# Patient Record
Sex: Female | Born: 1935
Health system: Southern US, Community
[De-identification: ages and names within clinical notes are randomized; demographics above are authoritative.]

## PROBLEM LIST (undated history)

## (undated) DIAGNOSIS — I10 Essential (primary) hypertension: Secondary | ICD-10-CM

## (undated) DIAGNOSIS — Z8719 Personal history of other diseases of the digestive system: Secondary | ICD-10-CM

## (undated) DIAGNOSIS — M810 Age-related osteoporosis without current pathological fracture: Secondary | ICD-10-CM

## (undated) DIAGNOSIS — E538 Deficiency of other specified B group vitamins: Secondary | ICD-10-CM

## (undated) DIAGNOSIS — D649 Anemia, unspecified: Secondary | ICD-10-CM

## (undated) DIAGNOSIS — R319 Hematuria, unspecified: Secondary | ICD-10-CM

## (undated) DIAGNOSIS — T7840XA Allergy, unspecified, initial encounter: Secondary | ICD-10-CM

## (undated) DIAGNOSIS — I495 Sick sinus syndrome: Secondary | ICD-10-CM

## (undated) DIAGNOSIS — K5792 Diverticulitis of intestine, part unspecified, without perforation or abscess without bleeding: Secondary | ICD-10-CM

## (undated) DIAGNOSIS — H269 Unspecified cataract: Secondary | ICD-10-CM

## (undated) DIAGNOSIS — J449 Chronic obstructive pulmonary disease, unspecified: Secondary | ICD-10-CM

## (undated) DIAGNOSIS — N189 Chronic kidney disease, unspecified: Secondary | ICD-10-CM

## (undated) DIAGNOSIS — R011 Cardiac murmur, unspecified: Secondary | ICD-10-CM

## (undated) DIAGNOSIS — D696 Thrombocytopenia, unspecified: Secondary | ICD-10-CM

## (undated) DIAGNOSIS — M25519 Pain in unspecified shoulder: Secondary | ICD-10-CM

## (undated) DIAGNOSIS — M1990 Unspecified osteoarthritis, unspecified site: Secondary | ICD-10-CM

## (undated) DIAGNOSIS — Z950 Presence of cardiac pacemaker: Secondary | ICD-10-CM

## (undated) DIAGNOSIS — E785 Hyperlipidemia, unspecified: Secondary | ICD-10-CM

## (undated) DIAGNOSIS — Z86718 Personal history of other venous thrombosis and embolism: Secondary | ICD-10-CM

## (undated) HISTORY — DX: Hyperlipidemia, unspecified: E78.5

## (undated) HISTORY — DX: Presence of cardiac pacemaker: Z95.0

## (undated) HISTORY — DX: Personal history of other venous thrombosis and embolism: Z86.718

## (undated) HISTORY — PX: PARATHYROIDECTOMY: SHX19

## (undated) HISTORY — DX: Sick sinus syndrome: I49.5

## (undated) HISTORY — PX: FOOT SURGERY: SHX648

## (undated) HISTORY — PX: INSERT / REPLACE / REMOVE PACEMAKER: SUR710

## (undated) HISTORY — DX: Hematuria, unspecified: R31.9

## (undated) HISTORY — PX: THYROID SURGERY: SHX805

## (undated) HISTORY — DX: Unspecified cataract: H26.9

## (undated) HISTORY — DX: Essential (primary) hypertension: I10

## (undated) HISTORY — DX: Allergy, unspecified, initial encounter: T78.40XA

## (undated) HISTORY — DX: Pain in unspecified shoulder: M25.519

## (undated) HISTORY — DX: Cardiac murmur, unspecified: R01.1

## (undated) HISTORY — PX: ORIF ANKLE FRACTURE BIMALLEOLAR: SUR920

## (undated) HISTORY — DX: Diverticulitis of intestine, part unspecified, without perforation or abscess without bleeding: K57.92

## (undated) HISTORY — DX: Chronic obstructive pulmonary disease, unspecified: J44.9

## (undated) HISTORY — PX: OTHER SURGICAL HISTORY: SHX169

## (undated) HISTORY — PX: ABDOMINAL HYSTERECTOMY: SHX81

---

## 1998-03-20 ENCOUNTER — Ambulatory Visit (HOSPITAL_COMMUNITY): Admission: RE | Admit: 1998-03-20 | Discharge: 1998-03-20 | Payer: Self-pay | Admitting: Nephrology

## 1998-03-20 ENCOUNTER — Encounter: Payer: Self-pay | Admitting: Nephrology

## 1998-06-13 ENCOUNTER — Ambulatory Visit (HOSPITAL_COMMUNITY): Admission: RE | Admit: 1998-06-13 | Discharge: 1998-06-13 | Payer: Self-pay

## 1998-07-14 ENCOUNTER — Ambulatory Visit (HOSPITAL_COMMUNITY): Admission: RE | Admit: 1998-07-14 | Discharge: 1998-07-15 | Payer: Self-pay

## 1998-08-06 ENCOUNTER — Emergency Department (HOSPITAL_COMMUNITY): Admission: EM | Admit: 1998-08-06 | Discharge: 1998-08-06 | Payer: Self-pay | Admitting: Emergency Medicine

## 1999-04-07 ENCOUNTER — Encounter: Admission: RE | Admit: 1999-04-07 | Discharge: 1999-04-07 | Payer: Self-pay | Admitting: Urology

## 1999-04-07 ENCOUNTER — Encounter: Payer: Self-pay | Admitting: Urology

## 1999-05-30 ENCOUNTER — Ambulatory Visit (HOSPITAL_COMMUNITY): Admission: EM | Admit: 1999-05-30 | Discharge: 1999-05-30 | Payer: Self-pay | Admitting: Emergency Medicine

## 1999-06-09 ENCOUNTER — Ambulatory Visit (HOSPITAL_COMMUNITY): Admission: RE | Admit: 1999-06-09 | Discharge: 1999-06-09 | Payer: Self-pay | Admitting: Urology

## 1999-06-09 ENCOUNTER — Encounter: Payer: Self-pay | Admitting: Urology

## 1999-07-20 ENCOUNTER — Ambulatory Visit (HOSPITAL_COMMUNITY): Admission: RE | Admit: 1999-07-20 | Discharge: 1999-07-20 | Payer: Self-pay | Admitting: Gastroenterology

## 1999-07-20 ENCOUNTER — Encounter: Payer: Self-pay | Admitting: Gastroenterology

## 2000-04-29 ENCOUNTER — Encounter: Payer: Self-pay | Admitting: Urology

## 2000-04-29 ENCOUNTER — Encounter: Admission: RE | Admit: 2000-04-29 | Discharge: 2000-04-29 | Payer: Self-pay | Admitting: Urology

## 2000-06-07 ENCOUNTER — Ambulatory Visit (HOSPITAL_COMMUNITY): Admission: RE | Admit: 2000-06-07 | Discharge: 2000-06-07 | Payer: Self-pay | Admitting: General Surgery

## 2000-07-28 ENCOUNTER — Encounter: Payer: Self-pay | Admitting: Internal Medicine

## 2000-07-28 ENCOUNTER — Ambulatory Visit (HOSPITAL_COMMUNITY): Admission: RE | Admit: 2000-07-28 | Discharge: 2000-07-28 | Payer: Self-pay | Admitting: Internal Medicine

## 2000-11-15 ENCOUNTER — Emergency Department (HOSPITAL_COMMUNITY): Admission: EM | Admit: 2000-11-15 | Discharge: 2000-11-15 | Payer: Self-pay | Admitting: *Deleted

## 2001-01-22 ENCOUNTER — Inpatient Hospital Stay (HOSPITAL_COMMUNITY): Admission: EM | Admit: 2001-01-22 | Discharge: 2001-01-25 | Payer: Self-pay | Admitting: *Deleted

## 2001-01-24 ENCOUNTER — Encounter: Payer: Self-pay | Admitting: Cardiology

## 2001-09-15 ENCOUNTER — Ambulatory Visit (HOSPITAL_BASED_OUTPATIENT_CLINIC_OR_DEPARTMENT_OTHER): Admission: RE | Admit: 2001-09-15 | Discharge: 2001-09-16 | Payer: Self-pay | Admitting: Orthopedic Surgery

## 2002-02-27 ENCOUNTER — Encounter: Payer: Self-pay | Admitting: Family Medicine

## 2002-02-27 ENCOUNTER — Ambulatory Visit (HOSPITAL_COMMUNITY): Admission: RE | Admit: 2002-02-27 | Discharge: 2002-02-27 | Payer: Self-pay | Admitting: Family Medicine

## 2002-04-27 ENCOUNTER — Ambulatory Visit (HOSPITAL_COMMUNITY): Admission: RE | Admit: 2002-04-27 | Discharge: 2002-04-27 | Payer: Self-pay | Admitting: General Surgery

## 2002-06-14 ENCOUNTER — Encounter: Payer: Self-pay | Admitting: Family Medicine

## 2002-06-14 ENCOUNTER — Ambulatory Visit (HOSPITAL_COMMUNITY): Admission: RE | Admit: 2002-06-14 | Discharge: 2002-06-14 | Payer: Self-pay | Admitting: Family Medicine

## 2002-06-25 ENCOUNTER — Encounter: Payer: Self-pay | Admitting: Internal Medicine

## 2002-06-25 ENCOUNTER — Ambulatory Visit (HOSPITAL_COMMUNITY): Admission: RE | Admit: 2002-06-25 | Discharge: 2002-06-25 | Payer: Self-pay | Admitting: Internal Medicine

## 2002-08-02 ENCOUNTER — Emergency Department (HOSPITAL_COMMUNITY): Admission: EM | Admit: 2002-08-02 | Discharge: 2002-08-02 | Payer: Self-pay | Admitting: Emergency Medicine

## 2002-08-02 ENCOUNTER — Encounter: Payer: Self-pay | Admitting: Emergency Medicine

## 2002-08-06 ENCOUNTER — Encounter: Admission: RE | Admit: 2002-08-06 | Discharge: 2002-08-06 | Payer: Self-pay | Admitting: Urology

## 2002-08-06 ENCOUNTER — Encounter: Payer: Self-pay | Admitting: Urology

## 2003-05-17 ENCOUNTER — Ambulatory Visit (HOSPITAL_COMMUNITY): Admission: RE | Admit: 2003-05-17 | Discharge: 2003-05-17 | Payer: Self-pay | Admitting: Family Medicine

## 2003-05-30 ENCOUNTER — Ambulatory Visit (HOSPITAL_COMMUNITY): Admission: RE | Admit: 2003-05-30 | Discharge: 2003-05-30 | Payer: Self-pay | Admitting: Urology

## 2003-06-20 ENCOUNTER — Ambulatory Visit (HOSPITAL_COMMUNITY): Admission: RE | Admit: 2003-06-20 | Discharge: 2003-06-20 | Payer: Self-pay | Admitting: Family Medicine

## 2003-07-20 ENCOUNTER — Emergency Department (HOSPITAL_COMMUNITY): Admission: EM | Admit: 2003-07-20 | Discharge: 2003-07-20 | Payer: Self-pay | Admitting: Emergency Medicine

## 2003-07-28 ENCOUNTER — Emergency Department (HOSPITAL_COMMUNITY): Admission: EM | Admit: 2003-07-28 | Discharge: 2003-07-28 | Payer: Self-pay | Admitting: Emergency Medicine

## 2004-01-22 ENCOUNTER — Ambulatory Visit: Payer: Self-pay | Admitting: *Deleted

## 2004-01-22 ENCOUNTER — Encounter (INDEPENDENT_AMBULATORY_CARE_PROVIDER_SITE_OTHER): Payer: Self-pay | Admitting: *Deleted

## 2004-01-27 ENCOUNTER — Encounter (HOSPITAL_COMMUNITY): Admission: RE | Admit: 2004-01-27 | Discharge: 2004-01-27 | Payer: Self-pay | Admitting: *Deleted

## 2004-01-27 ENCOUNTER — Ambulatory Visit: Payer: Self-pay | Admitting: *Deleted

## 2004-03-05 ENCOUNTER — Ambulatory Visit: Payer: Self-pay | Admitting: Internal Medicine

## 2004-04-07 ENCOUNTER — Other Ambulatory Visit: Admission: RE | Admit: 2004-04-07 | Discharge: 2004-04-07 | Payer: Self-pay | Admitting: Dermatology

## 2004-08-15 ENCOUNTER — Emergency Department (HOSPITAL_COMMUNITY): Admission: EM | Admit: 2004-08-15 | Discharge: 2004-08-15 | Payer: Self-pay | Admitting: Emergency Medicine

## 2004-08-27 ENCOUNTER — Ambulatory Visit: Payer: Self-pay | Admitting: *Deleted

## 2004-08-27 ENCOUNTER — Ambulatory Visit: Payer: Self-pay | Admitting: Internal Medicine

## 2004-09-03 ENCOUNTER — Emergency Department (HOSPITAL_COMMUNITY): Admission: EM | Admit: 2004-09-03 | Discharge: 2004-09-03 | Payer: Self-pay | Admitting: Emergency Medicine

## 2004-10-05 ENCOUNTER — Ambulatory Visit: Payer: Self-pay | Admitting: Internal Medicine

## 2004-10-12 ENCOUNTER — Other Ambulatory Visit: Admission: RE | Admit: 2004-10-12 | Discharge: 2004-10-12 | Payer: Self-pay | Admitting: Dermatology

## 2004-11-20 ENCOUNTER — Emergency Department (HOSPITAL_COMMUNITY): Admission: EM | Admit: 2004-11-20 | Discharge: 2004-11-20 | Payer: Self-pay | Admitting: Emergency Medicine

## 2004-12-14 ENCOUNTER — Ambulatory Visit: Payer: Self-pay | Admitting: Cardiology

## 2005-01-11 HISTORY — PX: OTHER SURGICAL HISTORY: SHX169

## 2005-02-05 ENCOUNTER — Encounter (INDEPENDENT_AMBULATORY_CARE_PROVIDER_SITE_OTHER): Payer: Self-pay | Admitting: Specialist

## 2005-02-05 ENCOUNTER — Inpatient Hospital Stay (HOSPITAL_COMMUNITY): Admission: RE | Admit: 2005-02-05 | Discharge: 2005-02-10 | Payer: Self-pay | Admitting: Surgery

## 2005-02-22 ENCOUNTER — Inpatient Hospital Stay (HOSPITAL_COMMUNITY): Admission: RE | Admit: 2005-02-22 | Discharge: 2005-02-26 | Payer: Self-pay | Admitting: Family Medicine

## 2005-03-23 ENCOUNTER — Ambulatory Visit (HOSPITAL_COMMUNITY): Admission: RE | Admit: 2005-03-23 | Discharge: 2005-03-23 | Payer: Self-pay | Admitting: Family Medicine

## 2005-03-25 ENCOUNTER — Ambulatory Visit: Payer: Self-pay | Admitting: Cardiology

## 2005-08-16 ENCOUNTER — Ambulatory Visit (HOSPITAL_COMMUNITY): Admission: RE | Admit: 2005-08-16 | Discharge: 2005-08-16 | Payer: Self-pay | Admitting: Internal Medicine

## 2005-09-17 ENCOUNTER — Ambulatory Visit (HOSPITAL_COMMUNITY): Admission: RE | Admit: 2005-09-17 | Discharge: 2005-09-17 | Payer: Self-pay | Admitting: Surgery

## 2005-09-23 ENCOUNTER — Ambulatory Visit: Payer: Self-pay | Admitting: Cardiology

## 2005-10-04 ENCOUNTER — Ambulatory Visit: Payer: Self-pay | Admitting: Internal Medicine

## 2005-10-16 ENCOUNTER — Emergency Department (HOSPITAL_COMMUNITY): Admission: EM | Admit: 2005-10-16 | Discharge: 2005-10-16 | Payer: Self-pay | Admitting: Emergency Medicine

## 2005-11-19 ENCOUNTER — Ambulatory Visit: Payer: Self-pay | Admitting: Internal Medicine

## 2006-03-19 ENCOUNTER — Emergency Department (HOSPITAL_COMMUNITY): Admission: EM | Admit: 2006-03-19 | Discharge: 2006-03-19 | Payer: Self-pay | Admitting: Emergency Medicine

## 2006-03-23 ENCOUNTER — Ambulatory Visit: Payer: Self-pay | Admitting: Cardiovascular Disease

## 2006-03-23 ENCOUNTER — Emergency Department (HOSPITAL_COMMUNITY): Admission: EM | Admit: 2006-03-23 | Discharge: 2006-03-23 | Payer: Self-pay | Admitting: Emergency Medicine

## 2006-03-24 ENCOUNTER — Ambulatory Visit: Payer: Self-pay | Admitting: Cardiology

## 2006-03-29 ENCOUNTER — Ambulatory Visit (HOSPITAL_COMMUNITY): Admission: RE | Admit: 2006-03-29 | Discharge: 2006-03-29 | Payer: Self-pay | Admitting: Cardiology

## 2006-03-30 ENCOUNTER — Ambulatory Visit: Payer: Self-pay | Admitting: Cardiology

## 2006-03-30 ENCOUNTER — Encounter (HOSPITAL_COMMUNITY): Admission: RE | Admit: 2006-03-30 | Discharge: 2006-04-29 | Payer: Self-pay | Admitting: Cardiology

## 2006-04-15 ENCOUNTER — Ambulatory Visit: Payer: Self-pay | Admitting: Cardiology

## 2006-04-21 ENCOUNTER — Ambulatory Visit: Payer: Self-pay | Admitting: Cardiology

## 2006-09-07 ENCOUNTER — Ambulatory Visit: Payer: Self-pay | Admitting: Internal Medicine

## 2007-03-02 ENCOUNTER — Ambulatory Visit: Payer: Self-pay | Admitting: Internal Medicine

## 2007-05-12 ENCOUNTER — Encounter: Payer: Self-pay | Admitting: Orthopedic Surgery

## 2007-05-12 ENCOUNTER — Ambulatory Visit (HOSPITAL_COMMUNITY): Admission: RE | Admit: 2007-05-12 | Discharge: 2007-05-12 | Payer: Self-pay | Admitting: Family Medicine

## 2007-05-25 ENCOUNTER — Encounter: Payer: Self-pay | Admitting: Orthopedic Surgery

## 2007-05-31 ENCOUNTER — Ambulatory Visit: Payer: Self-pay | Admitting: Orthopedic Surgery

## 2007-05-31 DIAGNOSIS — M25519 Pain in unspecified shoulder: Secondary | ICD-10-CM

## 2007-05-31 DIAGNOSIS — M7580 Other shoulder lesions, unspecified shoulder: Secondary | ICD-10-CM

## 2007-07-05 ENCOUNTER — Ambulatory Visit: Payer: Self-pay | Admitting: Orthopedic Surgery

## 2007-07-06 ENCOUNTER — Telehealth: Payer: Self-pay | Admitting: Orthopedic Surgery

## 2007-07-11 ENCOUNTER — Encounter: Payer: Self-pay | Admitting: Orthopedic Surgery

## 2007-08-31 ENCOUNTER — Ambulatory Visit: Payer: Self-pay | Admitting: Cardiology

## 2007-11-27 ENCOUNTER — Inpatient Hospital Stay (HOSPITAL_COMMUNITY): Admission: EM | Admit: 2007-11-27 | Discharge: 2007-11-29 | Payer: Self-pay | Admitting: Emergency Medicine

## 2008-03-08 ENCOUNTER — Encounter: Payer: Self-pay | Admitting: Internal Medicine

## 2008-03-13 ENCOUNTER — Ambulatory Visit: Payer: Self-pay | Admitting: Internal Medicine

## 2008-04-19 ENCOUNTER — Ambulatory Visit (HOSPITAL_COMMUNITY): Admission: RE | Admit: 2008-04-19 | Discharge: 2008-04-19 | Payer: Self-pay | Admitting: Surgery

## 2008-06-27 DIAGNOSIS — I495 Sick sinus syndrome: Secondary | ICD-10-CM

## 2008-06-27 DIAGNOSIS — Z950 Presence of cardiac pacemaker: Secondary | ICD-10-CM

## 2008-06-27 DIAGNOSIS — I10 Essential (primary) hypertension: Secondary | ICD-10-CM

## 2008-06-27 DIAGNOSIS — E785 Hyperlipidemia, unspecified: Secondary | ICD-10-CM | POA: Insufficient documentation

## 2008-08-28 ENCOUNTER — Ambulatory Visit (HOSPITAL_COMMUNITY): Admission: RE | Admit: 2008-08-28 | Discharge: 2008-08-28 | Payer: Self-pay | Admitting: Family Medicine

## 2008-09-20 ENCOUNTER — Ambulatory Visit: Payer: Self-pay | Admitting: Internal Medicine

## 2008-11-15 ENCOUNTER — Emergency Department (HOSPITAL_COMMUNITY): Admission: EM | Admit: 2008-11-15 | Discharge: 2008-11-15 | Payer: Self-pay | Admitting: Emergency Medicine

## 2008-12-09 ENCOUNTER — Ambulatory Visit (HOSPITAL_COMMUNITY): Admission: RE | Admit: 2008-12-09 | Discharge: 2008-12-09 | Payer: Self-pay | Admitting: Family Medicine

## 2009-04-14 ENCOUNTER — Encounter: Payer: Self-pay | Admitting: Internal Medicine

## 2009-04-16 ENCOUNTER — Ambulatory Visit: Payer: Self-pay | Admitting: Internal Medicine

## 2009-04-16 DIAGNOSIS — F17200 Nicotine dependence, unspecified, uncomplicated: Secondary | ICD-10-CM | POA: Insufficient documentation

## 2009-06-17 ENCOUNTER — Emergency Department (HOSPITAL_COMMUNITY): Admission: EM | Admit: 2009-06-17 | Discharge: 2009-06-17 | Payer: Self-pay | Admitting: Emergency Medicine

## 2009-11-07 ENCOUNTER — Ambulatory Visit: Payer: Self-pay | Admitting: Cardiology

## 2009-11-07 ENCOUNTER — Encounter (INDEPENDENT_AMBULATORY_CARE_PROVIDER_SITE_OTHER): Payer: Self-pay | Admitting: *Deleted

## 2010-01-11 ENCOUNTER — Emergency Department (HOSPITAL_COMMUNITY)
Admission: EM | Admit: 2010-01-11 | Discharge: 2010-01-11 | Payer: Self-pay | Source: Home / Self Care | Admitting: Emergency Medicine

## 2010-01-20 ENCOUNTER — Ambulatory Visit
Admission: RE | Admit: 2010-01-20 | Discharge: 2010-01-20 | Payer: Self-pay | Source: Home / Self Care | Attending: Adult Health | Admitting: Adult Health

## 2010-01-20 DIAGNOSIS — E876 Hypokalemia: Secondary | ICD-10-CM | POA: Insufficient documentation

## 2010-01-21 ENCOUNTER — Encounter: Payer: Self-pay | Admitting: Adult Health

## 2010-02-01 ENCOUNTER — Encounter: Payer: Self-pay | Admitting: Family Medicine

## 2010-02-12 NOTE — Assessment & Plan Note (Signed)
Summary: [facility] [place]  Medications Added MICRO-K 10 MEQ CR-CAPS (POTASSIUM CHLORIDE) take 1 tablet by mouth once daily      Allergies Added:    Visit Type:  Follow-up Primary Provider:  Dr.Golding   History of Present Illness: Ashley Olsen is a pleasant 77 y/o CF patient of Dr. Cristopher Peru with history of hypertension, SSS s/p PMK implantation 11/15/1990, mixed hyperlipidemia who was seen in [facility] ER  01/11/2010 with c/o Left sided chest pain-aching in her left breast that would come and go, last several seconds and then return.  She became concerned and therefore saw ER physician.  She was found to be hypokalemic with K of 3.2. She denies recent history of diarrhea., GI illness or loss of fluids in other ways. Potassium was repleted.  Cardiac enzymes were found to be negative and other labs were WNL.  EKG showed NSR, with demand PMK.  She was sent home and stated that the pain went away and had not returned.  She had some burping after getting home that subsided.  She is without complaint at this time and is here for follow-up.  Current Medications (verified): 1)  Norvasc 10 Mg  Tabs (Amlodipine Besylate) .... One By Mouth Daily 2)  Simvastatin 20 Mg Tabs (Simvastatin) .... One By Mouth Daily  Allergies (verified): 1)  ! Codeine 2)  ! Sulfa  Comments:  Nurse/Medical Assistant: patient brought meds she is only on 2 doesn't take asa [place] [facility]  Past History:  Past medical, surgical, family and social histories (including risk factors) reviewed, and no changes noted (except as noted below).  Past Medical History: Reviewed history from 06/27/2008 and no changes required. HYPERLIPIDEMIA-MIXED (ICD-272.4) HYPERTENSION, UNSPECIFIED (ICD-401.9) PACEMAKER (ICD-V45.Marland Kitchen01) SICK SINUS/ TACHY-BRADY SYNDROME (ICD-427.81) IMPINGEMENT SYNDROME (ICD-726.2) SHOULDER PAIN (ICD-719.41) history of blood clots in legs blood in urine  Past Surgical History: Reviewed history  from 06/27/2008 and no changes required. 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 FH of Cancer:   Social History: Reviewed history from 06/27/2008 and no changes required. Patient is divorced.  retired Tobacco Use - Yes. - pack a day Alcohol Use - no Drug Use - no  Review of Systems       All other systems have been reviewed and are negative unless stated above.   Vital Signs:  Patient profile:   77 year old female Weight:      157 pounds BMI:     27.91 O2 Sat:      94 % on Room air Pulse rate:   86 / minute BP sitting:   130 / 76  (left arm)  Vitals Entered By: Doretha Sou, CNA (January 20, 2010 1:25 PM)  O2 Flow:  Room air  Physical Exam  General:  Well developed, well nourished, in no acute distress. Head:  normocephalic and atraumatic Eyes:  PERRLA/EOM intact; conjunctiva and lids normal. Lungs:  Clear bilaterally to auscultation and percussion. Heart:  Non-displaced PMI, chest non-tender; regular rate and rhythm, S1, S2 without murmurs, rubs or gallops. Carotid upstroke normal, no bruit. Normal abdominal aortic size, no bruits. Femorals normal pulses, no bruits. Pedals normal pulses. No edema, no varicosities. Abdomen:  Bowel sounds positive; abdomen soft and non-tender without masses, organomegaly, or hernias noted. No hepatosplenomegaly. Msk:  Back normal, normal gait. Muscle strength and tone normal. Pulses:  pulses normal in all 4 extremities  Extremities:  No clubbing or cyanosis. Neurologic:  Alert and oriented x 3. Psych:  Normal affect.   EKG  Procedure date:  01/20/2010  Findings:      Right atrial enlargement.Normal sinus rhythm with rate of:  69 bpm  PPM Specifications Following MD:  Cristopher Peru, MD     PPM Vendor:  [employer] Jude     PPM Model Number:  [phone]     PPM Serial Number:  [id number] PPM DOI:   11/15/1990      Lead 1    Location: RV     DOI: 11/15/1990     Model #: [id number]     Serial #: [id number]     Status: active  Magnet Response Rate:  BOL  MR=PR ERI   100Msec>  Indications:  Loletha Grayer   PPM Follow Up Pacer Dependent:  No      Parameters Mode:  VVI     Lower Rate Limit:  60     Impression & Recommendations:  Problem # 1:  POTASSIUM DEFICIENCY (ICD-276.8) She was repleted in ER and chest discomfort subsided.  She is not on any potassium depleting medications.  I will proved low dose poatissium replacement Micro-K 10 mEq daily.  She is to follow-up with primary care physician for further work-up of this.  Problem # 2:  TOBACCO ABUSE (ICD-305.1) I have counseled her on cessation. She has no desire to quit. She states it keeps her weight down and she enjoys it too much.  I have discussed the risks of continuing this and she verbalizes understanding.  Problem # 3:  PACEMAKER, PERMANENT (ICD-V45.01) She will follow-up with Dr. Lovena Le on previously scheduled appointment.  Patient Instructions: 1)  Your physician recommends that you schedule a follow-up appointment in: as scheduled with Dr. Lovena Le 2)  Your physician has recommended you make the following change in your medication: Start taking Micro K 36meq by mouth once daily  Prescriptions: MICRO-K 10 MEQ CR-CAPS (POTASSIUM CHLORIDE) take 1 tablet by mouth once daily  #30 x 3   Entered by:   Jeani Hawking Via LPN   Authorized by:   Jory Sims, NP   Signed by:   Jeani Hawking Via LPN on [id number]   Method used:   Electronically to        [facility] (retail)       [place] [address] [place], [place]  [address]       Ph: [phone]       Fax: [phone]   [place]:   [phone]

## 2010-02-12 NOTE — Miscellaneous (Signed)
Summary: dx code correction   Clinical Lists Changes  Problems: Changed problem from PACEMAKER (ICD-V45.Marland Kitchen01) to PACEMAKER, PERMANENT (ICD-V45.01)  changed the incorrect dx code to correct dx code Ashley Olsen  November 07, 2009 11:09 AM

## 2010-02-12 NOTE — Procedures (Signed)
Summary: 6 mth f/u per checkout on 04/16/09/tg      Allergies Added:   Current Medications (verified): 1)  Norvasc 10 Mg  Tabs (Amlodipine Besylate) .... One By Mouth Daily 2)  Simvastatin 20 Mg Tabs (Simvastatin) .... One By Mouth Daily  Allergies (verified): 1)  ! Codeine 2)  ! Sulfa   PPM Specifications Following MD:  Cristopher Peru, MD     PPM Vendor:  [employer] Jude     PPM Model Number:  [id number]     PPM Serial Number:  [id number] PPM DOI:  11/15/1990      Lead 1    Location: RV     DOI: 11/15/1990     Model #: [id number]     Serial #: [id number]     Status: active  Magnet Response Rate:  BOL  MR=PR ERI   100Msec>  Indications:  Loletha Grayer   PPM Follow Up Remote Check?  No Battery Voltage:  2.73 V     Battery Est. Longevity:  3.3 years     Pacer Dependent:  No     Right Ventricle  Amplitude: 4.0 mV, Impedance: 489 ohms, Threshold: 2.0 V at 0.8 msec  Parameters Mode:  VVI     Lower Rate Limit:  60     Next Cardiology Appt Due:  04/12/2010 Tech Comments:  No parameter changes.  Device function normal.  ROV 6 months with Dr. Lovena Le in [place]. Alma Friendly, LPN  October 28, 624THL 11:49 AM

## 2010-02-12 NOTE — Assessment & Plan Note (Signed)
Summary: 6 mth f/u per checkout on 09/20/08/tg      Allergies Added:   Visit Type:  Follow-up Primary Provider:  Dr.Mark Caron Presume   History of Present Illness: Ms. Markowicz returns today for followup.  She is a very pleasant 77- year-old woman with a history of hypertension, chronic and ongoing tobacco abuse, history of symptomatic bradycardia with a very remote pacemaker placed in 1992 who returns today for followup.  The patient has no chest pain.  She has no shortness of breath.  She unfortunately has continued smoking cigarettes.  She returns today for followup.    Current Medications (verified): 1)  Norvasc 10 Mg  Tabs (Amlodipine Besylate) .... One By Mouth Daily 2)  Simvastatin 20 Mg Tabs (Simvastatin) .... One By Mouth Daily  Allergies (verified): 1)  ! Codeine 2)  ! 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    Vital Signs:  Patient profile:   77 year old female Height:      63 inches Weight:      161 pounds BMI:     28.62 Pulse rate:   79 / minute BP sitting:   141 / 82  (right arm)  Vitals Entered By: Doretha Sou, CNA (April 16, 2009 2:06 PM)  Physical Exam  General:  Well developed, well nourished, in no acute distress.  HEENT: normal Neck: supple. No JVD. Carotids 2+ bilaterally no bruits Cor: RRR no rubs, gallops or murmur Lungs: CTA Ab: soft, nontender. nondistended. No HSM. Good bowel sounds Ext: warm. no  cyanosis, clubbing or edema Neuro: alert and oriented. Grossly nonfocal. affect pleasant    PPM Specifications Following MD:  Cristopher Peru, MD     PPM Vendor:  [employer]     PPM Model Number:  [phone]     PPM Serial Number:  [id number] PPM DOI:  11/15/1990      Lead 1    Location: RV     DOI: 11/15/1990     Model #: [id number]     Serial #: [id number]     Status: active  Magnet Response Rate:  BOL  MR=PR ERI   100Msec>  Indications:  Loletha Grayer   PPM Follow Up Remote Check?  No Battery Voltage:  2.75 V     Battery Est. Longevity:  3.9 years     Pacer Dependent:  No     Right Ventricle  Amplitude: 4 mV, Impedance: 490 ohms, Threshold: 3.0 V at 0.8 msec  Parameters Mode:  VVI     Lower Rate Limit:  60     Next Cardiology Appt Due:  10/11/2009 Tech Comments:  RV 4.0@0 .8.  ROV 6 months [facility]. clinic. Alma Friendly, LPN  April  6, 624THL [id number] PM n MD Comments:  Agree with above.  Impression & Recommendations:  Problem # 1:  PACEMAKER (ICD-V45.Marland Kitchen01) Her device is stable.  Her RV lead does have increased pacing threshold but is stable and the battery longevity is over 3 yrs.  Problem # 2:  HYPERTENSION, UNSPECIFIED (ICD-401.9) Her blood pressure is well controlled.  I have asked her  to maintain a low sodium diet.  She will continue norvasc. Her updated medication list for this problem includes:    Norvasc 10 Mg Tabs (Amlodipine besylate) ..... One by mouth daily  Problem # 3:  TOBACCO ABUSE (ICD-305.1) I continue to ask her to stop smoking and discuss ways that she might stop.

## 2010-03-23 LAB — DIFFERENTIAL
Basophils Relative: 0 % (ref 0–1)
Eosinophils Absolute: 0.1 10*3/uL (ref 0.0–0.7)
Eosinophils Relative: 1 % (ref 0–5)
Lymphs Abs: 2 10*3/uL (ref 0.7–4.0)
Monocytes Relative: 8 % (ref 3–12)

## 2010-03-23 LAB — CBC
Hemoglobin: 14.3 g/dL (ref 12.0–15.0)
MCH: 31 pg (ref 26.0–34.0)
MCHC: 33.8 g/dL (ref 30.0–36.0)
MCV: 91.8 fL (ref 78.0–100.0)
Platelets: 166 10*3/uL (ref 150–400)
RBC: 4.61 MIL/uL (ref 3.87–5.11)

## 2010-03-23 LAB — BASIC METABOLIC PANEL
CO2: 25 mEq/L (ref 19–32)
Calcium: 9.1 mg/dL (ref 8.4–10.5)
Chloride: 109 mEq/L (ref 96–112)
Creatinine, Ser: 1.49 mg/dL — ABNORMAL HIGH (ref 0.4–1.2)
GFR calc Af Amer: 41 mL/min — ABNORMAL LOW (ref >60)
Glucose, Bld: 89 mg/dL (ref 70–99)

## 2010-04-22 LAB — DIFFERENTIAL
Basophils Relative: 0 % (ref 0–1)
Eosinophils Absolute: 0.1 10*3/uL (ref 0.0–0.7)
Eosinophils Relative: 1 % (ref 0–5)
Monocytes Absolute: 0.5 10*3/uL (ref 0.1–1.0)
Monocytes Relative: 4 % (ref 3–12)

## 2010-04-22 LAB — CBC
HCT: 49.3 % — ABNORMAL HIGH (ref 36.0–46.0)
Hemoglobin: 16.5 g/dL — ABNORMAL HIGH (ref 12.0–15.0)
MCHC: 33.4 g/dL (ref 30.0–36.0)
MCV: 91.8 fL (ref 78.0–100.0)
RBC: 5.37 MIL/uL — ABNORMAL HIGH (ref 3.87–5.11)

## 2010-04-22 LAB — BASIC METABOLIC PANEL
CO2: 25 mEq/L (ref 19–32)
Chloride: 107 mEq/L (ref 96–112)
GFR calc Af Amer: 29 mL/min — ABNORMAL LOW (ref >60)
Glucose, Bld: 99 mg/dL (ref 70–99)
Sodium: 142 mEq/L (ref 135–145)

## 2010-04-27 ENCOUNTER — Ambulatory Visit (INDEPENDENT_AMBULATORY_CARE_PROVIDER_SITE_OTHER): Payer: Medicare Other | Admitting: *Deleted

## 2010-04-27 DIAGNOSIS — I498 Other specified cardiac arrhythmias: Secondary | ICD-10-CM

## 2010-05-26 NOTE — Op Note (Signed)
NAMESOLIEL, CLAYPOOL            ACCOUNT NO.:  [id number]   MEDICAL RECORD NO.:  [id number]          PATIENT TYPE:  AMB   LOCATION:  ENDO                         FACILITY:  [facility]   PHYSICIAN:  Imogene Burn. Georgette Dover, M.D. DATE OF BIRTH:  06/19/1932   DATE OF PROCEDURE:  04/19/2008  DATE OF DISCHARGE:                               OPERATIVE REPORT   PREOPERATIVE DIAGNOSIS:  History of diverticulitis status post sigmoid  resection with recurrent left lower quadrant pain.   POSTOPERATIVE DIAGNOSIS:  Pan diverticulosis, otherwise normal colon.   PROCEDURE PERFORMED:  Flexible colonoscopy to the cecum.  No biopsies.   SURGEON:  Imogene Burn. Tsuei, M.D. , FACS   ANESTHESIA:  Conscious sedation with 50 mcg of fentanyl and 3 mg of  Versed.   INDICATIONS:  This is a 77 year old female who has had previous episodes  of recurrent diverticulitis status post a colon resection in January  2007.  Several months ago, she had a brief episode of left-sided  abdominal pain.  A CT scan showed some scattered colonic diverticulosis  with some mild inflammatory changes at the distal descending colon.  Her  symptoms resolved quickly with antibiotics, and she presents now for  repeat colonoscopy.   DESCRIPTION OF PROCEDURE:  The patient was brought to the procedure room  and was placed on her left side.  She was given intravenous sedation.  Once an adequate level of sedation was reached, I performed a digital  rectal examination.  No rectal masses were noted.  The flexible  colonoscope was inserted and was slowly advanced.  We were able to  identify the area of the anastomosis.  The blind pouch was identified  and showed no abnormalities.  There was a single large-mouth  diverticulum just above the anastomosis.  This appeared grossly normal.  There were a few scattered small-mouth, nonbleeding, noninflamed  diverticula scattered throughout the descending and transverse colon.  The scope was advanced all the  way to the cecum, identified by  landmarks.  We then slowly withdrew the scope.  The prep was adequate.  We did have to do a little bit of irrigation to provide adequate  visualization.  The scope was slowly withdrawn.  No polyps were noted.  She did have a few small diverticula in the  transverse and descending colon as previously noted.  The only large  diverticula is the one noted just above the anastomosis.  On retroflexed  view, there was no sign of internal hemorrhoids.  The scope was  withdrawn, and the patient was awakened.  She was brought to the  recovery room in stable condition.      Imogene Burn. Tsuei, M.D.  Electronically Signed     MKT/MEDQ  D:  04/19/2008  T:  04/19/2008  Job:  [id number]   cc:   Imogene Burn. Georgette Dover, M.D.  [address]

## 2010-05-26 NOTE — H&P (Signed)
Ashley Olsen, Ashley Olsen            ACCOUNT NO.:  [id number]   MEDICAL RECORD NO.:  [id number]          PATIENT TYPE:  INP   LOCATION:  A316                          FACILITY:  [facility]   PHYSICIAN:  Anselmo Pickler, DO    DATE OF BIRTH:  May 12, 1932   DATE OF ADMISSION:  11/27/2007  DATE OF DISCHARGE:  LH                              HISTORY & PHYSICAL   PRIMARY CARE PHYSICIAN:  Dr. Caron Presume.   CHIEF COMPLAINT:  Abdominal pain.   HISTORY OF PRESENT ILLNESS:  The patient is a 78 year old Caucasian  female who presented to the [facility] emergency room a chief complaint  of abdominal pain.  She stated that it started for the last 24 hours in  the left lower quadrant radiating to the left upper quadrant and  symptoms began to worsen.  Nothing made it better.  She had a normal  bowel movement.  Did not note any blood at all.  She says the pain is  constant and is worsening.  She does explain an extensive history with  colon resection and recurrent diverticulitis as well.  Her symptoms, she  is positive for nausea and vomiting, negative for diarrhea or  constipation, or any bright red blood per rectum.   PAST MEDICAL HISTORY:  Significant for hypertension, diverticulitis,  DVT, hyperlipidemia, irregular heart rate and pacemaker.   SURGICAL HISTORY:  She had an appendectomy, hysterectomy, pacemaker  insertion, foot surgery, parathyroid surgery, partial colectomy for  diverticulitis.   SOCIAL HISTORY:  She has had 2 children, smokes about a pack a day.  No  alcohol use or drug use.   ALLERGIES:  CODEINE and SULFA.   CURRENT MEDICATIONS:  1. Aspirin 81 mg once a day.  2. Norvasc 5 mg once a day.  3. Vytorin, no dose given.   REVIEW OF SYSTEMS:  Constitutionally negative for fever, weight loss or  weakness.  Positive for appetite change.  EYES:  Negative for changes in  vision.  Ears nose, mouth and throat negative for eye pain, ear pain,  nose pain for throat pain.  HEART:  Negative  for chest pain or  palpitations.  ABDOMEN:  Positive for abdominal pain, nausea and  vomiting.  GU:  Positive for incontinence.  Negative for dysuria and  hematuria.  MUSCULOSKELETAL:  Negative for arthralgias, back pain or  myalgias.  SKIN:  Negative for rash, abrasions, lacerations.  NEURO:  Negative for headache, abnormal gait or altered mental status.  PSYCHIATRIC:  Negative for depression or anxiety.  METABOLIC:  Negative  for thirst, cold or heat intolerance.  HEMATOLOGIC:  Negative for  bleeding tendencies.   PHYSICAL EXAM:  VITAL SIGNS:  Blood pressure 100/56, pulse rate 69,  respirations 18, temperature 100.3.  GENERAL:  The patient is a well-developed, well-nourished Caucasian  female who is in no acute distress.  HEENT:  Normocephalic, atraumatic.  PERRL.  EOMI.  Mouth and pharynx are  normal.  NECK:  Full range of motion and no lymphadenopathy noted.  HEART:  Regular rate and rhythm.  No murmurs, rubs or gallops.  LUNGS:  Clear to auscultation bilaterally, with  no rales, wheeze or  rhonchi.  ABDOMEN:  Soft, no left upper quadrant tenderness or left lower quadrant  tenderness at this point in time appreciated but tenderness with deep  palpation in the left lower quadrant.  EXTREMITIES:  Positive pulses.  No edema, ecchymosis or cyanosis.  Good  strength in all.  NEURO:  Cranial nerves 2-12 grossly intact.  SKIN:  Good turgor.  Good texture.  PSYCHIATRIC:  Awake and alert.  No abnormalities of mood or affect.   Her CT of the pelvis and abdomen shows scattered colonic diverticulosis  acute inflammatory process of distal descending colon, most likely  representing acute diverticulitis, question pericolonic fluid versus  tiny developing abscess posterior to descending colon.  Probable renal  cystic disease and nonobstructing calculi.  Right adrenal mass  compatible with adenoma.  Stable right lobe hepatic cystic lesion.  Pelvis with status post sigmoid colon resection,  hysterectomy,  appendectomy.  No pelvic mass, adenopathy or free fluid.  Scattered  pelvic phlebitis.  Unremarkable bladder and distal ureter.  No acute  intrapelvic abnormalities.  Her sodium is 137, potassium 3.3, chloride  is 106, carbon dioxide 24, glucose 112, BUN 18, creatinine 1.6, white  count 13.8, hemoglobin of 13.3, hematocrit 39.7, and a platelet count of  129,000.   ASSESSMENT/PLAN:  1. Acute diverticulitis.  2. Liver mass and adrenal mass.  3. Hypokalemia.  4. Hypertension.   PLAN:  Admit the patient to service of [facility].  Will place her on IV  fluids and make her n.p.o.  Start Cipro and Flagyl IV which was started  in the ED.  Will continue with that.  Will also do DVT and GI  prophylaxis and place the patient on home medications that are feasible  for her to take at this point in time.  If she continued to do well, we  will increase her diet slowly, but will also have surgery see her due to  these liver and adrenal masses to get their opinion as to what they  would like to do.  Will continue to follow her and change therapy as  necessary.      Anselmo Pickler, DO  Electronically Signed     CB/MEDQ  D:  11/27/2007  T:  11/27/2007  Job:  [id number]

## 2010-05-26 NOTE — Assessment & Plan Note (Signed)
Ashley Olsen                         ELECTROPHYSIOLOGY OFFICE NOTE   NAME:Ashley Olsen, Ashley Olsen                   MRN:          [id number]  DATE:03/13/2008                            DOB:          09-16-1932    Ashley Olsen returns today for followup.  She is a very pleasant 77-  year-old woman with a history of hypertension, chronic and ongoing  tobacco abuse, history of symptomatic bradycardia with a very remote  pacemaker placed in 1992 who returns today for followup.  The patient  has no chest pain.  She has no shortness of breath.  She unfortunately  has continued smoking cigarettes despite previous successful stopping  cigarette smoking.  She returns today for followup.   MEDICATIONS:  1. Simvastatin 20 a day.  2. Aspirin 81 a day.  3. Amlodipine 5 a day.   PHYSICAL EXAMINATION:  GENERAL:  She is a pleasant, well-appearing, 58-  year-old woman, in no distress.  VITAL SIGNS:  Blood pressure was 130/80, the pulse was 74 and regular,  the respirations were 18, weight was 164 pounds.  NECK:  No jugular venous distention.  LUNGS:  Clear bilaterally to auscultation.  No wheezes, rales, or  rhonchi are present and no increased work of breathing.  CARDIOVASCULAR:  Regular rate and rhythm.  Normal S1 and S2.  EXTREMITIES:  Demonstrate no edema.   Interrogation of her pacemaker demonstrates a Transport planner.  The R-  waves were 5.  The impedance 500.  The threshold 2.8.  She was not  pacing very much.  Battery voltage was 2.77 volts.  She was a sed VVI at  74.   IMPRESSION:  1. Symptomatic tachy-brady syndrome.  2. Status post pacemaker insertion.  3. Hypertension.  4. Ongoing tobacco abuse.   DISCUSSION:  Ashley Olsen is stable.  I have continued to counsel her  on stopping smoking.  I will see the patient back in office for  pacemaker follow up in 1 year.  She is instructed to maintain a low-  sodium diet.     Champ Mungo. Lovena Le, MD  Electronically Signed    GWT/MedQ  DD: 03/13/2008  DT: 03/14/2008  Job #: [id number]   cc:   Bonne Dolores, M.D.

## 2010-05-26 NOTE — Assessment & Plan Note (Signed)
[facility]                         ELECTROPHYSIOLOGY OFFICE NOTE   NAME:CHILDRESSCleta, Antkowiak                   MRN:          [id number]  DATE:09/07/2006                            DOB:          09-10-1932    Ms. Alcauter returns today for follow-up.  This is a very pleasant  woman with a history of tachybrady syndrome and pacemaker implantation  back in 1992.  She has a history of hypertension and COPD.  She returns  today for follow-up.  Her palpitations which were bothering her back in  the spring, have resolved and overall she feels well.  She has rare  peripheral edema, otherwise she has been stable.   PHYSICAL EXAMINATION:  VITAL SIGNS:  Blood pressure today was 118/76,  pulse 80 and irregular, respirations 18, weight 146 pounds down almost  20 pounds from her visit back in April.  GENERAL APPEARANCE:  She is a pleasant, well-appearing woman in no  distress.  NECK:  No jugular venous distension.  LUNGS:  Clear to auscultation bilaterally, no wheezing, rhonchi or rales  present.  CARDIOVASCULAR:  Irregular rate and rhythm with normal S1 and S2.  EXTREMITIES:  No edema.   IMPRESSION:  1. Symptomatic bradycardia.  2. Status post pacemaker insertion.  3. Peripheral vascular disease.  4. Hypertension.   DISCUSSION:  Overall, Ms. Ramler is stable.  I have encouraged her to  stop smoking cigarettes.  Will plan on seeing her back in the office in  one year for pacemaker follow-up.     Champ Mungo. Lovena Le, MD  Electronically Signed    GWT/MedQ  DD: 09/07/2006  DT: 09/08/2006  Job #: [id number]   cc:   Bonne Dolores, M.D.

## 2010-05-26 NOTE — Consult Note (Signed)
NAME:  Ashley Olsen, Ashley Olsen            ACCOUNT NO.:  [id number]   MEDICAL RECORD NO.:  [id number]          PATIENT TYPE:  INP   LOCATION:  A316                          FACILITY:  [facility]   PHYSICIAN:  Jamesetta So, M.D.  DATE OF BIRTH:  10/15/1932   DATE OF CONSULTATION:  DATE OF DISCHARGE:                                 CONSULTATION   CHIEF COMPLAINT:  Recurrent diverticulitis, liver mass, adrenal mass.   REFERRING PHYSICIAN:  [facility], Bonne Dolores, MD   HISTORY OF PRESENT ILLNESS:  The patient is a 77 year old white female  who presented with a several day history of worsening left-sided  abdominal pain.  She presented to the emergency room for further  evaluation and treatment.  CT scan of the abdomen and pelvis was  performed which revealed descending colon diverticulitis.  She was also  noted to have a leukocytosis.  She was admitted to the hospital for  further evaluation and treatment.  Of note, the fact on CT scan, a  stable right lobe hepatic cystic lesion was noted.  In addition, an  adenoma of the right adrenal gland was also noted.   The patient's past medical history is significant for a hand-assisted  laparoscopic sigmoid colectomy in January 2007, by Dr. Georgette Dover in  [place], [place].  He subsequently performed a colonoscopy in  September 2007, and noted multiple diverticuli present.   PAST MEDICAL HISTORY:  Hypertension, irregular heart rate, DVT,  hyperlipidemia.   PAST SURGICAL HISTORY:  Pacemaker placement, cholecystectomy,  parathyroid surgery, partial colectomy.   MEDICATIONS AT THE TIME OF ADMISSION:  Baby aspirin, Norvasc, Vytorin.   ALLERGIES:  CODEINE and SULFA.   REVIEW OF SYSTEMS:  Noncontributory.   PHYSICAL EXAMINATION:  The patient is a pleasant white female in no  acute distress.  She states her abdominal pain is resolving.  She is  afebrile.  Vital signs are stable.  Her abdomen is soft with minimal  tenderness along the left  side of the abdomen.  No hepatosplenomegaly,  masses, rigidity are noted.   White blood cell count is 11.5, hematocrit 35.7, platelet count 118.  Her MET-7 is remarkable for BUN of 20, creatinine 1.49.  Her albumin is  2.6, beta natriuretic peptide is elevated to 337.   IMPRESSION:  1. Recurrent descending colon diverticulitis.  This is her first      episode since her sigmoid colectomy in 2007.  2. Stable hepatic lesion, it has been present since CAT scans done in      2004.  It is felt to be benign radiologically.  3. Benign right adrenal adenoma.   PLAN:  As the patient's abdominal pain and diverticulitis seem to be  resolving, I would advance her to a full liquid diet.  She does not  require surgical intervention at this point.  Should she have recurrent  episodes of diverticulitis, an additional colectomy would be indicated.  I will follow the patient with you.      Jamesetta So, M.D.  Electronically Signed     MAJ/MEDQ  D:  11/28/2007  T:  11/28/2007  Job:  [id number]   cc:   Bonne Dolores, M.D.  Fax: [phone]

## 2010-05-26 NOTE — Group Therapy Note (Signed)
Ashley Olsen, Ashley Olsen            ACCOUNT NO.:  [id number]   MEDICAL RECORD NO.:  [id number]          PATIENT TYPE:  INP   LOCATION:  A316                          FACILITY:  [facility]   PHYSICIAN:  Anselmo Pickler, DO    DATE OF BIRTH:  [birth year]   DATE OF PROCEDURE:  11/28/2007  DATE OF DISCHARGE:                                 PROGRESS NOTE   The patient was seen today, doing better.  She has advanced her diet  without any pain.  She states she feels better and she is currently pain  free.  She is still on a liquid diet.  We will go and advance her diet  as tolerated and possibly discharge her tomorrow.   Her vitals today, temperature 98, pulse 65, respirations 18, blood  pressure 114/57.  GENERAL:  The patient is well-developed, well-nourished in no acute  distress.  HEART:  Regular rate and rhythm.  LUNGS:  Clear to auscultation bilaterally.  ABDOMEN:  Soft, nontender, and nondistended.  EXTREMITIES:  Positive pulses.   Her labs for today, her white count is 11.5, hemoglobin 12.1, hematocrit  35.7, platelet count 118. INR is 1.2.  Sodium 139, potassium 3.7,  chloride 109, CO2 of 23, glucose 82, BUN 20 and creatinine 1.49.  BNP of  337.   ASSESSMENT/PLAN:  1. Diverticulitis.  The patient seems to be responding very well with      current intravenous regimen.  She is able to eat tat his point in      time.  Her nausea and vomiting is gone.  We will continue with      intravenous therapy over the next 24 hours and plan on discharging      her tomorrow to home.  We will send her home on p.o. Cipro and      Flagyl.  2. For her liver mass and adrenal mass, the patient has expressed      interest of having her surgeon, Dr. Collie Siad in [place] do anything      further with that.  3. Hypokalemia has been replaced.  4. Also she has some renal insufficiency.  I will have her follow up      with her primary care doctor regarding this as well.  5. Her beta-natriuretic peptide is  slightly elevated.  We will      continue to monitor that and have her follow up with her primary      care physician.   PLAN OF CARE:  The patient as long as she continues to do well tomorrow  can be discharged and follow up with primary care physician and her  general surgeon.     Anselmo Pickler, DO  Electronically Signed    CB/MEDQ  D:  11/28/2007  T:  11/28/2007  Job:  [id number]

## 2010-05-29 NOTE — Assessment & Plan Note (Signed)
[place]                              CARDIOLOGY OFFICE NOTE   NAME:Guymon, ZOELYNN PICKNEY                   MRN:          [id number]  DATE:11/19/2005                            DOB:          12/23/1932    IDENTIFICATION:  Ms. Buenaflor is a 77 year old with hypertension,  dyslipidemia, tachybrady syndrome (status post pacemaker), and continued  tobacco use.  Last seen in cardiology in December 2006.   I saw her back in September.  Her blood pressure was a little high and I  increased her Norvasc.  She wanted to try one-and-a-half tablets per day.   In the interval, she has done well.  She denies dizziness.  No edema.   CURRENT MEDICATIONS:  1. Vytorin 10/40 daily.  2. Aspirin 81 mg daily.  3. Norvasc 7.5 daily.   PHYSICAL EXAMINATION:  GENERAL:  The patient is in no distress.  VITAL SIGNS:  Blood pressure 140/80, pulse is 70 and regular, weight 159.  NECK:  JVP is normal.  LUNGS:  Clear.  CARDIAC:  Regular rate and rhythm, grade [id number] systolic murmur heard best at  the base (consistent with aortic sclerosis).  ABDOMEN:  Benign.  EXTREMITIES:  No edema.   IMPRESSION:  1. Hypertension.  Overall a tad better.  I did not take it because she      gets anxious with doctors.  I would increase her Norvasc to 10, goal of      blood pressure 110-130.  I told her to get it checked at outside      facilities.  2. Dyslipidemia.  Fasting Lipomed is pending.  3. Status post pacemaker.  Single chamber.   Will set followup for the spring, sooner if problems develop.  Be in touch  with her regarding the blood work.     Fay Records, MD, [facility]  Electronically Signed    PVR/MedQ  DD: 11/19/2005  DT: 11/19/2005  Job #: [id number]   cc:   Bonne Dolores, M.D.

## 2010-05-29 NOTE — Letter (Signed)
April 15, 2006     RE:  Ashley Olsen, Ashley Olsen  MRN:  [id number]  /  DOB:  05/16/1932   Anibal Henderson, MD.   Dear [name] returns to the office for further evaluation and treatment  of dyspnea.  Since her last visit, she has improved by daily exercise.  She feels fairly well at the present time.   Carotid ultrasound studies were performed.  These revealed the presence  of atherosclerotic disease, but no focal stenosis.  A stress Myoview  study showed chronotropic incompetence in that her heart rate increase  with exercise was not appropriate.  She had limited exercise tolerance,  but neither electrocardiographic evidence nor scintigraphic evidence for  ischemia or infarction.  PTFs were normal.   Medications are unchanged.   PHYSICAL EXAMINATION:  GENERAL:  On exam, a pleasant woman in no acute  distress.  VITAL SIGNS:  The weight is 165, four pounds more than three weeks ago.  Blood pressure 120/80, heart rate 66 and regular, respirations 16.  NECK:  No jugulovenous distention; normal carotid upstroke; minimal  bruit on the right.  LUNGS:  Clear.  CARDIAC:  Normal first and second heart sounds; soft systolic ejection  murmur.  ABDOMEN:  Soft and nontender; no masses; no organomegaly.  EXTREMITIES:  No edema.   IMPRESSION:  Ashley Olsen is doing generally well.  We will arrange for  pacemaker reassessment, which is overdue in any case.  Current settings  of her pacemaker will be assessed and readjusted if warranted.  I will  plan to see this nice woman again in one year.    Sincerely,      Cristopher Estimable. Lattie Haw, MD, [facility]  Electronically Signed    RMR/MedQ  DD: 04/15/2006  DT: 04/15/2006  Job #: [id number]

## 2010-05-29 NOTE — Discharge Summary (Signed)
[facility]  Patient:    Ashley Olsen, Ashley Olsen Visit Number: [id number] MRN: [id number]          Service Type: MED Location: 2A A227 01 Attending Physician:  Leonides Grills Dictated by:   Elsie Lincoln, M.D. Admit Date:  01/22/2001 Discharge Date: 01/25/2001                             Discharge Summary  DISCHARGE DIAGNOSES: 1. Palpitations. 2. Dyspnea on exertion. 3. Indwelling pacemaker. 4. Hypertension. 5. History of kidney stones. 6. History of parathyroid surgery.  HOSPITAL COURSE:  This 77 year old white female who had an indwelling pacemaker for eight years for irregular heartbeat presented to the emergency room stating she was having palpitations and shortness of breath on moving around the room.  In the ER she was found to have a junctional rhythm with no P-waves.  Rhythm was a rate of 70.  Patient was admitted to the floor and begun on telemetry.  Serial cardiac enzymes were negative.  Patient was seen in consultation by Dr. Verl Blalock and basically her follow-up EKG showed a first degree block, rate controlled.  Her Norvasc 5 mg was changed to Diltiazem 120 mg.  Patients chest x-ray was negative.  CBC, electrolytes were within normal range.  PTH and calcium were normal.  Patient had no more problems with dyspnea during her stay.  Echocardiogram was normal with ejection fraction of 69%.  She is discharged home on previous medications except Norvasc was changed to Diltiazem 120.  Follow up as needed with cardiology as well as [place].  Again, patient is stable. Dictated by:   Elsie Lincoln, M.D. Attending Physician:  Leonides Grills DD:  02/13/01 TD:  02/13/01 Job: [id number]

## 2010-05-29 NOTE — Discharge Summary (Signed)
NAMESIMONETTA, Ashley Olsen            ACCOUNT NO.:  [id number]   MEDICAL RECORD NO.:  [id number]          PATIENT TYPE:  INP   LOCATION:  [place]                         FACILITY:  [facility]   PHYSICIAN:  Imogene Burn. Georgette Dover, M.D. DATE OF BIRTH:  [birth year]   DATE OF ADMISSION:  02/05/2005  DATE OF DISCHARGE:  02/10/2005                                 DISCHARGE SUMMARY   ADMISSION DIAGNOSIS:  Recurrent sigmoid diverticulitis.   POSTOPERATIVE DIAGNOSIS:  Recurrent sigmoid diverticulitis.   PROCEDURE PERFORMED:  Laparoscopic hand-assisted sigmoid colectomy.   BRIEF HISTORY:  The patient is a 77 year old female who has had several  episodes of sigmoid diverticulitis.  The latest episode was in August, 2006.  The patient presented for elective colon resection.  She has been  asymptomatic for the last several weeks.  She underwent a preoperative bowel  prep at home.  She was admitted to the hospital on February 05, 2005.  She  underwent a laparoscopic hand-assisted sigmoid colectomy with primary  anastomosis.  Postoperatively, the patient did well.  She was started on  clear liquids immediately.  She regained bowel function on postoperative day  #3.  Her diet was advanced.  At the time of discharge, she was tolerating a  regular diet without difficulty.  Her wound looks good, and the patient is  ambulating.  She has had hardly any pain.   DISCHARGE INSTRUCTIONS:  The patient was offered a prescription for pain  medication, but she has not really been taking any while here in the  hospital.  She says she has some leftover hydrocodone at home.  The patient  may shower over her staples.  She is to follow up on Monday for staple  removal.  Her diet has no restrictions.  She should limit her activity with  no heavy lifting or strenuous activity.      Imogene Burn. Tsuei, M.D.  Electronically Signed     MKT/MEDQ  D:  02/10/2005  T:  02/10/2005  Job:  [id number]

## 2010-05-29 NOTE — Op Note (Signed)
NAME:  Ashley Olsen, HARPHAM            ACCOUNT NO.:  [id number]   MEDICAL RECORD NO.:  [id number]          PATIENT TYPE:  AMB   LOCATION:  ENDO                         FACILITY:  [facility]   PHYSICIAN:  Imogene Burn. Georgette Dover, M.D. DATE OF BIRTH:  01/24/1932   DATE OF PROCEDURE:  09/17/2005  DATE OF DISCHARGE:                                 OPERATIVE REPORT   PREOPERATIVE DIAGNOSES:  1. Screening colonoscopy.  2. Recurrent sigmoid diverticulitis status post sigmoid colectomy.   PROCEDURE PERFORMED:  Flexible colonoscopy to the cecum.  No biopsies.   SURGEON:  Imogene Burn. Tsuei, M.D.   ANESTHESIA:  Conscious sedation.   INDICATIONS:  The patient is a 77 year old female who has had recurrent  diverticulitis.  She underwent  elective colon resection in January 2007.  She underwent a laparoscopic hand-assisted sigmoid colectomy with a primary  anastomosis.  She has not had a screening colonoscopy on over eight years.  She presents now for screening.   DESCRIPTION OF PROCEDURE:  The patient was brought to the procedure room and  was placed on her left side.  Appropriate monitors were placed.  She was  given intravenous sedation with close monitoring.  Once an adequate level of  sedation was given, a digital rectal examination revealed no sign of rectal  mass.  The scope was inserted.  We slowly advanced scope up to 25 cm.  We  were able to identify the area of the anastomosis.  There was a blind pouch  which was examined and showed no abnormalities.  We had some difficulty  intubating the descending colon, but we were finally able to thread scope  into the descending colon and advance it around to the cecum.  Cecum was  confirmed by right lower quadrant palpation.  The prep was suboptimal.  There was some adherent stool to the sides of the colon which were  irrigated.  However, we cannot exclude tiny polyps.  The scope was then  slowly withdrawn with careful examination of the mucosa.  The gas was  also  suctioned out as we withdrew the scope.  The patient has a few scattered  diverticula throughout her entire colon.  We retroflexed in the rectal  vault, and the patient had some mild internal hemorrhoids.  The anastomosis  is patent but angulated.  The patient tolerated the procedure well.  She was  brought to recovery room in stable condition.   DISCHARGE INSTRUCTIONS:  Due to suboptimal prep and inability to exclude  tiny polyps, we will recommend a repeat colonoscopy in 5 years.  The patient  should call our office at [phone] if there are any problems after this  colonoscopy.      Imogene Burn. Tsuei, M.D.  Electronically Signed     MKT/MEDQ  D:  09/17/2005  T:  09/17/2005  Job:  [id number]

## 2010-05-29 NOTE — H&P (Signed)
Ashley Olsen, GERLITZ            ACCOUNT NO.:  [id number]   MEDICAL RECORD NO.:  [id number]          PATIENT TYPE:  INP   LOCATION:  A201                          FACILITY:  [facility]   PHYSICIAN:  Bonne Dolores, M.D.    DATE OF BIRTH:  [birth year]   DATE OF ADMISSION:  02/22/2005  DATE OF DISCHARGE:  LH                                HISTORY & PHYSICAL   CHIEF COMPLAINT:  Cramping in leg.   HISTORY OF PRESENT ILLNESS:  This is a 77 year old female with a history of  tachy-brady syndrome status post pacemaker placement in remote past.  She  also has a history of hyperlipidemia and hypertension which has been well  controlled.   The patient also has had multiple episodes of diverticulitis and underwent  elective laparoscopic sigmoid colectomy on February 10, 2005.  She had a  remarkably uncomplicated postoperative course and was discharged in good  condition after a 4-5 day hospital stay.   The patient was evaluated approximately a week after surgery.  She was  experiencing no difficulty.  Approximately 3-4 days ago, the patient  experienced some cramping in her right lower extremity.  Doppler was ordered  to evaluate her venous system.  The Doppler was positive for deep vein  thrombosis of the right lower extremity which involved superficial femoral  vein extending down to the popliteal vein and common femoral and profunda  femoral venous segments are patent.   The patient was admitted with deep vein thrombosis postoperatively.   There is no history of chest pain, shortness of breath, syncope,  palpitations, hemoptysis, nausea, vomiting, diarrhea, melena, hematemesis,  hematochezia, genitourinary symptoms.  Her abdomen remains nontender, and  she is tolerating a regular diet without difficulty.   MEDICATIONS:  1.  Norvasc.  2.  Vytorin.  3.  Ogen.  4.  Enteric coated aspirin.  Dosages to follow.   PAST MEDICAL HISTORY:  As noted above.  She also has history of multiple  kidney stones.   FAMILY HISTORY:  Noncontributory.   REVIEW OF SYSTEMS:  Negative except as mentioned.   SOCIAL HISTORY:  Nonsmoker, nondrinker.   PHYSICAL EXAMINATION:  GENERAL APPEARANCE:  Very pleasant female who is  alert and oriented in no acute distress.  VITAL SIGNS:  Within normal limits.  HEENT:  Normocephalic, atraumatic.  Pupils are equal.  Ears, Nose and  Throat:  Benign.  NECK:  Supple.  No bruits, thyromegaly or lymphadenopathy.  LUNGS:  Clear to A&P.  HEART:  Normal without murmurs, rubs or gallops.  ABDOMEN:  Nontender, nondistended.  Bowel sounds are intact.  EXTREMITIES:  No clubbing or cyanosis.  There is a very slight amount of  edema in the right lower extremity with tenderness of the calf.  Homan's  sign is equivocal.  Peripheral pulses are intact.  NEUROLOGICAL:  Nonfocal.   ASSESSMENT:  Deep vein thrombosis approximately two weeks postop following  elective sigmoid colectomy.   PLAN:  Admit for routine therapy which includes heparinization and cross  over with Coumadin.  She will be followed and treated expectantly.      Bonne Dolores, M.D.  Electronically Signed     MC/MEDQ  D:  02/22/2005  T:  02/22/2005  Job:  [id number]

## 2010-05-29 NOTE — Procedures (Signed)
Ashley Olsen, Ashley Olsen            ACCOUNT NO.:  [id number]   MEDICAL RECORD NO.:  [id number]           PATIENT TYPE:   LOCATION:  RESP                          FACILITY:  [facility]   PHYSICIAN:  Edward L. Luan Pulling, M.D.DATE OF BIRTH:  Mar 01, 1932   DATE OF PROCEDURE:  DATE OF DISCHARGE:                            PULMONARY FUNCTION TEST   1. Spirometry is normal.  2. Lung volumes are normal.  3. DLCO is normal.      Edward L. Luan Pulling, M.D.  Electronically Signed     ELH/MEDQ  D:  03/31/2006  T:  03/31/2006  Job:  [id number]   cc:   Cristopher Estimable. Lattie Haw, [place], [address]  [place], [place] [address]

## 2010-05-29 NOTE — Procedures (Signed)
NAME:  Ashley Olsen, Ashley Olsen NO.:  [id number]   MEDICAL RECORD NO.:  [id number]         PATIENT TYPE:  REC   LOCATION:  RAD                           FACILITY:  [facility]   PHYSICIAN:  Fay Records, M.D.    DATE OF BIRTH:  1932-10-19   DATE OF PROCEDURE:  DATE OF DISCHARGE:                                    STRESS TEST   HISTORY:  [name] is a 77 year old female with no known coronary  disease, history of tachy-brady syndrome status post pacemaker.  She reports  atypical chest discomfort.  Her cardiac risk factors include hypertension,  hyperlipidemia and tobacco abuse.   BASELINE DATA:  An EKG reveals a sinus rhythm at 62 beats per minute with  some non-specific ST abnormalities.  Blood pressure is 168/90.   Patient exercised for a total of 4 minutes and 41 seconds to Bruce protocol  stage II and 7.0 [place].  Maximum heart rate was 136 beats per minute, which  is 90% of predicted maximum.  Maximum blood pressure was 202/88 and resolved  down to 158/84 in recovery.  EKG revealed few PVCs, no ischemic changes were  noted.  Patient reported shortness of breath with exercise, which resolved  in recovery.  Exercise was stopped secondary to shortness of breath and leg  fatigue.   Final images and results are pending M.D. review.      AB/MEDQ  D:  01/27/2004  T:  01/27/2004  Job:  [id number]

## 2010-05-29 NOTE — Assessment & Plan Note (Signed)
[place]                              CARDIOLOGY OFFICE NOTE   NAME:Ashley Olsen, Ashley Olsen                   MRN:          [id number]  DATE:10/04/2005                            DOB:          1932/11/07    IDENTIFICATION:  Ashley Olsen is a 77 year old woman with a history of  hypertension, dyslipidemia, tachy-brady syndrome status post pacemaker, and  continued tobacco use.  She was last seen in Cardiology Clinic back in  December of 2006 though I do not have a clinic note from March.  She was  seen in [facility] earlier this month.   In the interval she denies chest pain.  No shortness of breath.   CURRENT MEDICATIONS:  1. Norvasc 10 q. day.  2. Vytorin 10/40 q. day.   PHYSICAL EXAM:  The patient is in no distress.  Blood pressure 148/80, 150/86, pulse is 72.  Weight 155.  LUNGS:  Clear.  NECK:  JVP is normal.  CARDIAC:  Regular rate and rhythm.  S1, S2, no S3.  No murmurs.  ABDOMEN:  Benign.  EXTREMITIES:  No edema.   LABS:  Significant for an LDL of 82, HDL of 47, triglycerides 149, total  cholesterol 159 (March 2007).   IMPRESSION:  1. Hypertension.  Would increase Norvasc.  She has taken 1.5 before.  I      will go ahead and set this up with her and follow up in 4 weeks' time.  2. Dyslipidemia.  Will need a fasting LipoMed panel, as it has been      several months.   I will set to see her back in about 6 weeks with labs prior.   ADDENDUM:  Counseled on tobacco use.                                Fay Records, MD, [facility]    PVR/MedQ  DD:  10/04/2005  DT:  10/06/2005  Job #:  [id number]   cc:   Bonne Dolores, M.D.

## 2010-05-29 NOTE — Letter (Signed)
March 24, 2006    Bonne Dolores, M.D.  [address] [place], [address]   RE:  Ashley Olsen, Ashley Olsen  MRN:  [id number]  /  DOB:  10/10/1932   Dear [name]:   Ms. Plowden returns to the office for continued assessment and  treatment of dyspnea. She was previously evaluated by Dr. Harrington Challenger and in  the emergency department. She experienced of palpitations with a history  of tachy-brady syndrome. No arrhythmia was identified. She subsequently  has noted dyspnea, predominantly with mild exertion, but also at rest.  Her chest x-ray shows no significant abnormalities other than the  presence of a pacemaker. She incidentally noted mild osteopenia. An  echocardiogram was performed yesterday. I do not have an official  report, but I was told that it looked pretty good. She has a known  history of at least mild aortic stenosis. Recent blood tests have shown  mild renal insufficiency with a creatinine of 1.8. She has a 50 pack  year history of cigarette smoking with a chronic cough and a diagnosis  of chronic bronchitis. She stopped smoking a week or two ago.   Her only medications include:  1. Vytorin 10/40 mg daily.  2. Amlodipine 10 mg daily.  3. Aspirin.   During her emergency department evaluation, a mildly elevated BNP level  was obtained (approximately 200) prompting the addition of a diuretic to  her treatment.   PHYSICAL EXAMINATION:  GENERAL: Very pleasant woman in no acute  distress.  VITAL SIGNS: Weight 161, 2 pounds more than in November of last year,  blood pressure 120/60, heart rate 55 and regular, respirations 16.  NECK: No jugular venous distension; right carotid bruit present.  LUNGS: Generally clear without any marked prolongation in the expiratory  phase; no wheezing nor rhonchi.  CARDIAC: Normal first and second heart sounds; normal PMI; grade 2/6  basilar systolic ejection murmur.  ABDOMEN: Soft and nontender; no organomegaly.  EXTREMITIES: No  edema.  NEUROMUSCULAR: Symmetric strength and tone; normal cranial nerves.  SKIN: No significant lesions.  ENDOCRINE: No thyromegaly.   IMPRESSION:  Ms. Balcerzak has somewhat vague symptoms of uncertain  significance. I suspect that chronic obstructive pulmonary disease  accounts for most of her problems - we will proceed with formal  pulmonary function tests. A recent ABG was normal. Although, a stress  nuclear study was negative in January 2006, one will be repeated. We  will try exercise. If she does not override her pacemaker, a  pharmacologic test will be necessary.   Her pacemaker tracings were reviewed and discussed with our electro-  physiologist. She has sinus rhythm most, if not all of the time, but a  single chamber device, accordingly, it cannot be interrogated to  identify whether or not she is experiencing atrial fibrillation.   Clinically, she has mild aortic stenosis. This should not result in  symptoms.   I will see this nice woman again after her testing has been completing.    Sincerely,      Cristopher Estimable. Lattie Haw, MD, [facility]  Electronically Signed    RMR/MedQ  DD: 03/24/2006  DT: 03/26/2006  Job #: [id number]

## 2010-05-29 NOTE — H&P (Signed)
NAMEGLENNY, Ashley Olsen            ACCOUNT NO.:  [id number]   MEDICAL RECORD NO.:  [id number]          PATIENT TYPE:  EMS   LOCATION:  ED                            FACILITY:  [facility]   PHYSICIAN:  Peter C. Johnsie Cancel, MD, FACCDATE OF BIRTH:  December 26, 1932   DATE OF ADMISSION:  03/23/2006  DATE OF DISCHARGE:  03/12/2008LH                              HISTORY & PHYSICAL   [name] is a 77 year old patient of Dr. Lattie Haw.  She has been  having increasing palpitations and dyspnea.   She has a history of sick sinus syndrome, PAF and pacemaker placement.   The patient was seen in the emergency room over the weekend.  Apparently, there was a issue about her pacemaker.  I told Dr. Chauncey Fischer  to have the [facility]. Jude pacemaker interrogated.  I believe it is a Advertising account executive.  The interrogation was totally normal.  The patient's BNP was  mildly elevated in the 200 range.  She has no history of LV dysfunction  or coronary artery disease.  She was given a prescription for Lasix 20  mg a day.  She did not feel this until Monday.  She came to the ER again  with shortness of breath.  Dr. Domingo Cocking called me while I was in the  office.  The patient's blood gases are normal.  Her labs are all normal.  She has ruled out for myocardial infarction.   Her EKG apparently shows no acute changes, but I do not have this with  me.   Her chest x-ray showed no congestive heart failure.  She actually had a  CT scan to rule out PE, despite not having an elevated D-dimer, and this  was done last weekend, and it was normal.   Talking to the patient, she complains of shortness of breath and  occasional palpitations.  She is not having any significant syncope,  lower extremity edema.  There has been no diaphoresis.   REVIEW OF SYSTEMS:  Her review of systems is remarkable for no  significant fever, cough or phlegm production.   MEDICATIONS:  Listed in her chart.  They include:  1. Norvasc 7.5 mg per day for  hypertension.  2. Aspirin a day.  3. Vytorin.  4. Lasix 20 mg a day.   ALLERGIES:  SHE IS ALLERGIC TO SULFA.   FAMILY HISTORY:  Noncontributory.   The patient lives with her husband.  She is retired from a Special educational needs teacher.  Her daughter was with her in the ER today.   I actually watched the patient for 5 minutes before engaging her.  She  was asleep with no dyspnea whatsoever and a respiratory rate of 10.  Her  SATs were 99%.   After waking up, the patient complained of significant shortness of  breath.  She was laying flat in bed without difficulty.  Blood pressure  was 130/70, pulses 88 and regular.  HEENT:  is normal.  JVP is not  elevated.  There is S1, S2, normal heart sounds.  Pacer is in good  position over the right clavicle.  ABDOMEN:  Benign.  EXTREMITIES:  Lower extremities:  Intact pulses, no edema.   IMPRESSION:  Recurrent shortness of breath in a patient with no known  coronary disease.  Hypertension appears to be well treated.  BNP  initially elevated over the weekend, suggesting possible diastolic heart  failure.  I asked Murray Hodgkins to do a 2D echocardiogram on the patient today  in the emergency room, so long as this does not show any acute  abnormality.  She will be discharged.  She already has a follow-up  appointment with Dr. Lattie Haw and Nevin Bloodgood in the office tomorrow.  I  suspect the patient should have an adenosine Myoview study to rule out  occult coronary disease, although this would be an unusual presentation.  She should continue her Lasix 20 a day, in light of her elevated BNP and  shortness of breath.  I suspect the patient should also have a CardioNet  monitor to rule out paroxysmal atrial fibrillation.  It is possible that  PAF is causing some shortness of breath, although she said she was short  of breath in the ER, while her monitor showed sinus rhythm.   The patient and the daughter seemed a little bit frustrated, due to  their multiple hospital visits  without an exact diagnosis; however, I  told her I really did not think she was sick enough to be admitted to  the hospital.  Further recommendations will be based on the results of  echo.  She will then follow up with Dr. Lattie Haw and Nevin Bloodgood in the office  for outpatient monitoring to rule out PAF, and also a stress test to  rule out occult coronary disease.      Wallis Bamberg. Johnsie Cancel, MD, [facility]  Electronically Signed     PCN/MEDQ  D:  03/23/2006  T:  03/24/2006  Job:  [id number]

## 2010-05-29 NOTE — Assessment & Plan Note (Signed)
 HEALTHCARE                         ELECTROPHYSIOLOGY OFFICE NOTE   NAME:CHILDRESSSanii, Gagen                   MRN:          [id number]  DATE:04/21/2006                            DOB:          23-Mar-1932    Ms. Ashley Olsen is seen today in the [facility] at the request of  Dr. Lattie Haw post hospital discharge in March.  She was up in the office  on April 4 to follow up on her dyspnea for pacemaker interrogation,  possible chronotropic incompetence.  On interrogation of her device  today, she has a Chief Strategy Officer, model number is 2711, [employer].  Date of  implant was 40.  Her battery voltage is 2.77, R waves measured 4  millivolts with a ventricular pacing threshold of 2 volts at 0.8  milliseconds and a ventricular lead impedance of 498 ohms.  She is  programmed VVI at a rate of 50 and seems to have on her own intrinsic  rhythm at about 52 beats a minute.  Unfortunately, this pacemaker is  such an old model that we get absolutely no histograms or information on  it, nor can she be programmed with rate response.  I did increase her  base rate today to 60 beats a minute.  She expressed that she has been  trying to do some dancing for exercise and is unable to do it, just gets  very short-winded and has to sit down, although walking is okay on a  regular basis.  I will discuss all this with Dr. Lovena Le and possibly the  thing to look at is upgrading her pacemaker to a device with rate  response.      Alma Friendly, LPN  Electronically Signed      Champ Mungo. Lovena Le, MD  Electronically Signed   PO/MedQ  DD: 04/21/2006  DT: 04/21/2006  Job #: [id number]

## 2010-05-29 NOTE — Op Note (Signed)
[facility]  Patient:    Ashley Olsen, Ashley Olsen                   MRN: [id number] Proc. Date: 06/09/99 Adm. Date:  [id number] Disc. Date: [id number] Attending:  Wendy Poet Iii                           Operative Report  PREOPERATIVE DIAGNOSIS:  Right ureterolithiasis, status post placement of stent.  PROCEDURE:  Cystourethroscopy, right retrograde ureteropyelogram, removal of previous stent, ureteroscopy, both rigid and flexible ureteroscopy, with holmium laser lithotripsy, stone basket extraction, and replacement of right double J stent.  SURGEON:  Duane Lope. Parks Neptune, M.D.  ANESTHESIA:  General endotracheal.  ESTIMATED BLOOD LOSS:  Negligible.  TUBES:  A 24 cm, 6 [place] Surgitek double pigtail stent.  COMPLICATIONS:  None.  INDICATIONS FOR PROCEDURE:  Ms. Baumel is a lovely 77 year old white female who presented with right flank pain, nausea and vomiting.  Approximately a week and a half ago, she underwent cystoscopy and placement of a right double J stent by Hanley Ben, M.D., and comes now for treatment of the stone. On KUB, it appeared the stone was in the upper ureter.  She basically refused lithotripsy, extracorporeal shockwave lithotripsy, and elected to proceed with the above procedure.  DESCRIPTION OF PROCEDURE:  The patient was placed in the supine position, after proper general endotracheal anesthesia was placed in the dorsal lithotomy position and prepped and draped with Betadine in sterile fashion.  A cystourethroscopy was performed with a 22.5 ACMI panendoscope utilizing the 12 and 70 degree lens.  The bladder was carefully inspected, and there were no lesions present.  The right stent was grasped and pulled to the meatus, and a 0.038 French Teflon-coated guidewire was placed up to the right renal pelvis and the stent was removed.  Ureteroscopy was then performed with the short, thin ACMI semirigid uretroscope.  The  stone was visualized in the upper ureter and utilizing the 65 micron laser fiber at a setting of 0.5 joules and a repetition rate of 5, the stone was partially fragmented and the main large fragment displaced into the right kidney.  The rigid scope was passed into the right renal pelvis, but the stone could not be visualized; therefore, it was removed.  A second wire was placed with a double-lumen inserter, and a flexible ureteroscope was placed into the kidney and the stone was localized in the lower pole calyceal system.  It was grasped with a nitinol basket and extracted intact.  The flexible ureteroscope was then placed back into the kidney.  The entire calyceal system was visualized with contrast, and each calyx was carefully identified and there were no further stones noted to be within it.  The ureter in its entirety was inspected and again, no fragments or stones were noted.  Under fluoroscopic guidance, a 24 cm, 6 [place] Surgitek double pigtail stent was placed and noted to be in good position within the right renal pelvis and within the bladder.  The bladder was drained, the panendoscope was removed, the stone was submitted for stone analysis, and the patient was taken to the recovery room in stable condition. DD:  06/09/99 TD:  06/11/99 Job: [id number]

## 2010-05-29 NOTE — Discharge Summary (Signed)
NAMEDONI, Ashley Olsen            ACCOUNT NO.:  [id number]   MEDICAL RECORD NO.:  [id number]          PATIENT TYPE:  INP   LOCATION:  [id number]                          FACILITY:  [facility]   PHYSICIAN:  Bonne Dolores, M.D.    DATE OF BIRTH:  [birth year]   DATE OF ADMISSION:  02/22/2005  DATE OF DISCHARGE:  02/16/2007LH                                 DISCHARGE SUMMARY   DISCHARGE DIAGNOSES:  1.  Deep venous thrombosis of the right lower extremity involving the      superficial femoral vein extending down to the popliteal vein and common      femoral.  2.  Hypertension.  3.  Hyperlipidemia.  4.  Recurrent diverticulitis status post recent sigmoid colectomy (January      31, [id number]) uncomplicated postoperative course.   HISTORY:  For details regarding admission please refer to the admitting  note.  Briefly this 77 year old female with the above history underwent  surgery as noted above.  She did very well.  Approximately 3-4 days prior to  this admission the patient developed a cramping sensation of her right lower  extremity.  Physical examination was unremarkable however given her high  risk postoperatively of DVT a venous Doppler was ordered.  This was positive  for DVT as noted above.  She was admitted for heparinization and further  evaluation and therapy as indicated.   COURSE IN THE HOSPITAL:  The patient was heparinized immediately.  Pharmacy  protocol was followed.  She was also begun on Coumadin.  She experienced no  complications and her INR became therapeutic on the fourth hospital day as  she was stable for discharge.   DISPOSITION:  Medications include:  Norvasc 5 mg daily, Vytorin 10/20 and  Coumadin 5 mg daily.  She was instructed to discontinue hormone replacement  therapy for obvious reasons.  She will be followed and treated expectantly  as an outpatient and a repeat INR will de drawn in approximately 2 days.      Bonne Dolores, M.D.  Electronically Signed     MC/MEDQ  D:  04/08/2005  T:  04/10/2005  Job:  [id number]

## 2010-05-29 NOTE — Op Note (Signed)
[facility]  Patient:    Ashley Olsen, Ashley Olsen                     MRN: [id number] Proc. Date: 05/30/99 Attending:  Hanley Ben, M.D. CC:         Duane Lope. Parks Neptune, M.D.                           Operative Report  PREOPERATIVE DIAGNOSIS:  Right ureteral stone and left kidney stone.  POSTOPERATIVE DIAGNOSIS:  Right ureteral stone and left kidney stone.  PROCEDURE:  Cystoscopy, right retrograde pyelogram, and insertion of right double J catheter.  SURGEON:  Hanley Ben, M.D.  ANESTHESIA:  General.  INDICATION:  The patient is a 77 year old female who has a history of kidney stone.  She started complaining of right flank pain last night.  It became severe this morning.  She was seen at [facility], and a CT scan of the abdomen and pelvis showed a right hydronephrosis secondary to a 3 x 9 mm right midureteral stone.  The patient is a patient of Dr. Rosana Hoes, and she is scheduled for cystoscopy and right retrograde pyelogram and insertion of double J catheter.  DESCRIPTION OF PROCEDURE:  Under general anesthesia, the patient was prepped and draped and placed in the dorsal lithotomy position.  A #22 Wappler cystoscope was inserted in the bladder.  The bladder mucosa is normal.  There is no stone or tumor in the bladder.  The ureteral orifices are in normal position and shape.  A cone-tip catheter was then passed through the cystoscope and into the right ureteral orifice.  Contrast was then injected through the ureteral catheter.  The distal ureter appears normal.  There is a filling defect in the right midureter, and the proximal ureter is dilated. The cone-tip catheter was then removed.   A guidewire was then passed through the cystoscope into the right ureter.  A #6 French-26 double J catheter was passed over the guidewire with the proximal end of the double J catheter in the collecting system, the distal end is in the bladder.  The bladder  was then emptied, and the cystoscope and guidewire were removed.  The patient tolerated the procedure well and left the OR in satisfactory condition to postanesthesia care unit. DD:  05/30/99 TD:  06/04/99 Job: [id number]

## 2010-05-29 NOTE — H&P (Signed)
[facility]  Patient:    Ashley Olsen, Ashley Olsen Visit Number: [id number] MRN: [id number]          Service Type: MED Location: 2A A227 01 Attending Physician:  Ross Ludwig. Dictated by:   Elsie Lincoln, M.D. Admit Date:  01/22/2001                           History and Physical  CHIEF COMPLAINT:  Short of breath and palpitations.  HISTORY OF PRESENT ILLNESS:  This is a 77 year old white female who has an indwelling pacemaker due to an irregular heart beat x 8 years.  The patient on the morning of admission noted when she was up and around she felt palpitations and shortness of breath.  In the emergency room the patient was found to be in a junctional rhythm which was new for her with the absence of T waves.  Rhythm at rest is approximately 70 and patient is uncomfortable.  The patient was attempted to be transferred to [facility] for cardiology consult but there were no available beds so she will be admitted here for full observation and cardiology consult on Monday.  The patient denies chest pain or significant cough.  She denies nausea, vomiting, diaphoresis or dizziness.   PAST MEDICAL HISTORY: 1. The patient has hypertension for which she takes Norvasc 5 mg. 2. She is menopausal and takes Ogen 0.5.  She also takes Ecotrin 81 mg a day. 3. The patient is status post parathyroid surgery x 2 years and is on oral    calcium. 4. The patient has a history of multiple kidney stones.  REVIEW OF SYSTEMS:  Noncontributory to physical examination.  PHYSICAL EXAMINATION:  GENERAL:  Well-developed, well-nourished white female who appears younger than her age and appears comfortable.  VITAL SIGNS:  She is afebrile.  Pulse is 70 and regular.  Respirations are 20 and unlabored.  Blood pressure is 145/86.  HEENT:  TMs are normal.  Pupils equal, round, and reactive to light. Oropharynx is benign.  NECK:  Supple without JVD, bruit or  thyromegaly.  LUNGS:  Clear in all areas with a regular sinus rhythm without murmur, gallop, or rub.  ABDOMEN:  Soft and nontender.  EXTREMITIES:  Without clubbing, cyanosis, or edema.  NEUROLOGIC:  Grossly intact.  ADDENDUM:  The patient is on lescol 80 mg for hypercholesterolemia.  ASSESSMENT: 1. Arrhythmia. 2. EKG change. 3. Dyspnea on exertion. 4. History of indwelling pacemaker. 5. History of parathyroid surgery. 6. History of multiple kidney stones. 7. Hypertension. Dictated by:   Elsie Lincoln, M.D. Attending Physician:  Ross Ludwig DD:  01/22/01 TD:  01/22/01 Job: [id number]

## 2010-05-29 NOTE — Op Note (Signed)
   NAME:  Ashley Olsen, Ashley Olsen                      ACCOUNT NO.:  [id number]   MEDICAL RECORD NO.:  [id number]                   PATIENT TYPE:  AMB   LOCATION:  DAY                                  FACILITY:   PHYSICIAN:  Jamesetta So, M.D.               DATE OF BIRTH:  May 14, 1932   DATE OF PROCEDURE:  04/27/2002  DATE OF DISCHARGE:                                 OPERATIVE REPORT   PREOPERATIVE DIAGNOSIS:  Enlarging mass, left chest wall.   POSTOPERATIVE DIAGNOSIS:  Enlarging mass, left chest wall, lipoma, 3 cm in  size.   OPERATION/PROCEDURE:  Excision of 3 cm benign mass, left chest wall.   SURGEON:  Jamesetta So, M.D.   ANESTHESIA:  MAC.   INDICATIONS:  The patient is a 77 year old white female who presents with an  enlarging mass along the left chest wall.  The risks and benefits of the  procedure including bleeding, infection and recurrence of the mass were  fully explained to the patient, who gave informed consent.   DESCRIPTION OF PROCEDURE:  The patient was placed in right lateral decubitus  position.  The left lower posterior chest wall was prepped and draped using  the usual sterile technique with Betadine.  Surgical site confirmation was  performed.  One percent Xylocaine was for local anesthesia.  The transverse  incision was made over the mass and this was taken down to the mass. The  mass was subcutaneous in nature and appeared to be a lipoma which was  approximately 3 cm in size.  It was sized without difficulty.  Any bleeding  was controlled using Bovie electrocautery.  The skin was reapproximated  using a 4-0 Vicryl subcuticular suture.  Steri-Strips and a dry sterile  dressing were applied.  All tape and needle counts correct at the end of the  procedure.  The patient was transferred to PACU in stable condition.  Complications none.  Specimen - mass, left chest wall.  Blood loss minimal.                                               Jamesetta So,  M.D.    MAJ/MEDQ  D:  04/27/2002  T:  04/27/2002  Job:  [id number]   cc:   Halford Chessman, M.D.  [address] [place] [place]  [place]  [place] [phone]  Fax: [phone]

## 2010-05-29 NOTE — Op Note (Signed)
NAME:  Ashley Olsen, Ashley Olsen                      ACCOUNT NO.:  [id number]   MEDICAL RECORD NO.:  [id number]                   PATIENT TYPE:  AMB   LOCATION:  DSC                                  FACILITY:  [facility]   PHYSICIAN:  Colin Rhein, M.D.               DATE OF BIRTH:  04-Jun-1932   DATE OF PROCEDURE:  DATE OF DISCHARGE:                                 OPERATIVE REPORT   PREOPERATIVE DIAGNOSES:  1. Right first, second, third post-traumatic tarsal-metatarsal joint     arthritis.  2. Right-sided Achilles tendon.  3. Right inner cuneiform arthrodesis.   POSTOPERATIVE DIAGNOSES:  Same.   OPERATION:  1. Right first, second, third tarsal-metatarsal joint arthrodesis.  2. Right local bone graft.  3. Right percutaneous tendon Achilles lengthening.  4. Right inner cuneiform arthrodesis.   ANESTHESIA:  General endotracheal tube with popliteal block.   SURGEON:  Weber Cooks, M.D.   ASSISTANT:  Gerri Lins, P.A.C.   ESTIMATED BLOOD LOSS:  Minimal.   TOURNIQUET TIME:  An hour and 27 minutes.   COMPLICATIONS:  None.   DISPOSITION:  Stable to the PAR.   INDICATIONS FOR PROCEDURE:  This is a 77 year old female who sustained a  right Lisfranc fracture dislocation in 1997.  She underwent open reduction  and internal fixation at that time.  She subsequently, since this time,  developed pain and arthritis that has been interfering with her life to the  point that she cannot do what she wants to do.  She was consented for the  above procedure.  All risks which include infection, nerve or vessel injury,  malunion, nonunion, malrotation, arterial failure, DVT, possible PE, skin  breakdown were all explained.  Questions were encouraged and answered.   OPERATION:  The patient was brought to the operating room and placed in  supine position.  After general endotracheal tube anesthesia was  administered as well as popliteal block and Ancef IV piggyback, the right  lower extremity  was then prepped and draped in a sterile manner with a  proximally placed thigh tourniquet.  Prior to inflating the tourniquet, a  percutaneous tendon Achilles lengthening with two medial and one lateral  hemisections were performed, and this had maximum release of the tight  Achilles tendon.  We then gradually exsanguinated the right lower extremity.  The tourniquet was elevated to 290 mmHg.  A longitudinal incision over the  previous scar just medial to the EHL was then made.  Dissection was carried  down medial to the EHL tendon.  We then, with a full-thickness flap,  elevated off the first tarsal-metatarsal joint.  There was a large spur  dorsally.  This was removed and saved for later bone graft locally.  We then  entered the first tarsal-metatarsal joint and denuded the remaining portion  of the cartilage which was minimal, and then performed multiple 2 mm drill  holes in either side of the joint.  We then approached the inner cuneiform  area.  We then entered the inner cuneiform space between the middle and  medial cuneiforms, identified the arthritis, denuded the cartilage, and put  multiple 2 mm drill holes.  We then elevated the soft tissues off the second  tarsal-metatarsal joint area, entered the joint, again denuded the  cartilage, put multiple 2 mm drills in either side of the joint and removed  the dorsal spur that was used later for local bone graft.  We then made  another longitudinal incision over the third tarsal-metatarsal joint and  through this incision we were able to remove the remaining portion of the  cartilage and put multiple 2 mm drill holes on the lateral aspect of the  second tarsal-metatarsal joint as well as entering the third tarsal-  metatarsal joint, denuded the cartilage, and put multiple 2 mm drill holes.  At this point, the joint surfaces were prepared for fixation.  We placed a  notch in the base of the first metatarsal approximately 1.5 to 2 cm  distal  to the first tarsal-metatarsal joint.  We also put a notch in the lateral  aspect of the middle cuneiform and as well as in the base of the third  metatarsal.  We then started first with the second tarsal-metatarsal joint  and keyed this in to the joint, reduced it with a two-point reduction clamp,  and placed a 3.5 fully threaded cortical lag screw across this area.  This  had excellent compression of the second tarsal-metatarsal joint arthrodesis  site.  We then placed a longitudinal screw from the base of the first  metatarsal into the middle cuneiform lag screw 3.5 fully threaded cortical  screw.  Again, this had excellent compression of the arthrodesis site, and  this was placed in a technique described by __________.  We then placed a  criss-cross screw from the medial cuneiform into the second metatarsal.  Again, this was a lag screw which was placed through the notch as well.  We  the reduced the third tarsal-metatarsal joint with a two-point reduction  clamp, applied pressure, and placed a lag screw from the base of the third  metatarsal into the lateral cuneiform, again, through the notch in the base  of the third metatarsal.  Again, this was a lag screw 3.5 mm fully threaded  cortical screw.  The wounds were all copiously irrigated with normal saline.  X-rays were obtained in the AP, lateral, and oblique planes.  Also of note,  we also placed a compression screw between the cuneiforms for the  arthrodesis of the cuneiforms.  This was placed through a stab incision in  the medial aspect of the medial cuneiform as well, and this was a  compression screw 3.5 mm fully threaded cortical lag screw.  This was done  after the joint was reduced with a two-point reduction clamp.  Final x-rays  were obtained in the AP, lateral, and oblique planes and showed the proper  position of the joints as well as fixation.  Also of note, the first metatarsal is adequately plantar flexed, and  this was palpated along the  forefoot as well.  The lesser metatarsal heads were palpated and none were  of abnormal elevation or depression.  Again, the wounds were copiously  irrigated with normal saline.  Stress/strain relieving bone graft as  described by Meyer Russel was placed from the local bone graft obtained.  The  tourniquet was deflated.  Hemostasis was obtained.  Subcu was closed with 3-  0 Vicryl.  The skin was closed with 4-0 nylon.  A sterile dressing was  applied.  Modified Jones dressing applied with the ankle in neutral  dorsiflexion.  The patient was stable to the PR.                                               Colin Rhein, M.D.    PAB/MEDQ  D:  09/15/2001  T:  09/18/2001  Job:  [id number]

## 2010-05-29 NOTE — Procedures (Signed)
[facility]  Patient:    Ashley Olsen, Ashley Olsen Visit Number: [id number] MRN: [id number]          Service Type: MED Location: 2A A227 01 Attending Physician:  Leonides Grills Dictated by:   Ermalinda Barrios, P.A.C. Proc. Date: 01/24/01 Admit Date:  01/22/2001 Discharge Date: 01/25/2001                                Stress Test  HISTORY:  This is a 77 year old white female patient with pacemaker admitted with palpitations and shortness of breath.  FINDINGS:  Baseline EKG, sinus bradycardia with inferior Q-waves.  The patient had no chest pain, shortness of breath or cardiac complaints with Adenosine infusion.  She did develop some ventricular pacing at 50 beats per minute. Otherwise, she had no EKG changes.  Cardiolite images are to follow. Dictated by:   Ermalinda Barrios, P.A.C. Attending Physician:  Leonides Grills DD:  01/24/01 TD:  01/25/01 Job: [id number]

## 2010-05-29 NOTE — Assessment & Plan Note (Signed)
[facility]                           ELECTROPHYSIOLOGY OFFICE NOTE   NAME:CHILDRESSCoralynn, Nipple                   MRN:          [id number]  DATE:09/23/2005                            DOB:          [birth year]    Ms. Thummel was seen in the [facility] on the 13th of September of  2007 for followup of her pacemaker Paragon model #2011.  Date of implant was  November 15, 1990 for bradycardia.  On interrogation of the device today, her  batter voltage is 2.78.  R-waves measured 5 mV with a ventricular pacing  threshold of 2 volts at 0.8 msec which is stable, and a ventricular lead  impedence  of 475.  No changes were made in her parameters today.  She will  be seen again in 6 months' time.                                   Alma Friendly, LPN                                Champ Mungo. Lovena Le, MD   PO/MedQ  DD:  09/23/2005  DT:  09/24/2005  Job #:  [id number]

## 2010-05-29 NOTE — Procedures (Signed)
Ashley Olsen, Ashley Olsen            ACCOUNT NO.:  [id number]   MEDICAL RECORD NO.:  [id number]          PATIENT TYPE:  EMS   LOCATION:  ED                            FACILITY:  [facility]   PHYSICIAN:  Peter C. Johnsie Cancel, MD, FACCDATE OF BIRTH:  November 28, 1932   DATE OF PROCEDURE:  03/23/2006  DATE OF DISCHARGE:  03/23/2006                                ECHOCARDIOGRAM   INDICATIONS:  Shortness of breath, previous pacemaker.   Left ventricular cavity size was normal.  There was mild LVH, ejection  fraction was 60%.  There were no regional wall motion abnormalities.  The aortic valve was calcified.  There was mild aortic stenosis.  The  peak gradient was only 15 mmHg with a peak velocity of 2 m/sec.  The  mean gradient was 10 mmHg.  Mitral valve was structurally normal.  There  was mild left atrial enlargement.  The right-sided cardiac chambers were  normal.  The RV pacemaker lead was well-seen.  There was no atrial  septal defect, no ventricular septal defect.  Subcostal imaging revealed  no effusion and no ASD.   M-mode measurements included aortic dimension of 25 mm, left atrial  dimension of 39 mm, septal thickness was 13 mm, LV diastolic dimension  was 37 mm, LV systolic dimension was 29 mm.   FINAL IMPRESSION:  1. Mild left ventricular hypertrophy, ejection fraction 60%.  2. Normal right ventricular function with pacemaker wire seen.  3. Calcified aortic valve with mild aortic stenosis, peak gradient 15      mmHg, mean gradient 10 mmHg, peak velocity only 2 m/sec.  4. Normal mitral valve.  5. No pericardial effusion.  6. No evidence of pulmonary hypertension.      Wallis Bamberg. Johnsie Cancel, MD, [facility]  Electronically Signed     PCN/MEDQ  D:  03/23/2006  T:  03/25/2006  Job:  [id number]

## 2010-05-29 NOTE — Op Note (Signed)
Ashley Olsen, Ashley Olsen            ACCOUNT NO.:  [id number]   MEDICAL RECORD NO.:  [id number]          PATIENT TYPE:  INP   LOCATION:  [facility]                         FACILITY:  [facility]   PHYSICIAN:  Imogene Burn. Georgette Dover, M.D. DATE OF BIRTH:  01/02/1933   DATE OF PROCEDURE:  02/05/2005  DATE OF DISCHARGE:                                 OPERATIVE REPORT   PREOPERATIVE DIAGNOSIS:  Recurrent sigmoid diverticulitis.   POSTOPERATIVE DIAGNOSIS:  Recurrent sigmoid diverticulitis.   PROCEDURE PERFORMED:  Laparoscopic hand assisted sigmoid colectomy.   SURGEON:  Dr. Georgette Dover   ASSISTANT:  Dr. Marlou Starks   ANESTHESIA:  General endotracheal.   INDICATIONS:  The patient is a 77 year old female, who presents with several  recent attacks of sigmoid diverticulitis.  These resolved with antibiotics.  She has had several CT scans which showed diverticulosis but no evidence of  abscess or fistula.   DESCRIPTION OF PROCEDURE:  The patient was brought to the operating room and  placed in supine position operating room table.  After an adequate level of  general endotracheal anesthesia was obtained, a Foley catheter was placed  under sterile technique.  Her legs were placed in yellow fin stirrups in low  lithotomy position.  Her abdomen was then prepped with Betadine and draped  in sterile fashion.  A time-out was taken to ensure the proper patient,  proper procedure.  A vertical incision was made just above her umbilicus.  Dissection was carried down to the fascia which was opened vertically.  A  Hasson cannula was inserted into the peritoneal cavity and secured with a 2-  0 Vicryl stay suture.  Pneumoperitoneum was then obtained by insufflating  CO2 maintaining maximal pressure of 15 mmHg.  We examined the abdomen which  showed an uninflamed descending and sigmoid colon.  The splenic flexure  seemed to be fairly high.  We then created a transverse incision in the left  lower quadrant, measuring 6 cm.  We  secured our dissection down to the  external oblique fascia which was opened.  The muscle fibers were split.  We  encountered the inferior epigastric artery which we ligated between clamps  with 2-0 silk sutures.  The peritoneal cavity was entered, and the lap disk  device was inserted.  Pneumoperitoneum was reobtained.  I inserted my right  hand, and we placed a 10 mm port in the right upper quadrant under direct  vision.  The camera was moved to the right upper quadrant port.  Using the  harmonic scalpel, the splenic flexure and descending colon were mobilized.  We carried our dissection around the splenic flexure to the transverse  colon.  The proximal descending colon did not appear to be involved with  diverticula.  We then proceeded inferiorly down the left pericolic gutter.  We mobilized the colon medially.  We continued this dissection down to the  pelvic brim.  I then repositioned on the patient's left side with my left  hand in the port.  We continued our mobilization down into the pelvis.  The  sigmoid colon showed some evidence of previous inflammation and diverticula,  but there was no inflammation at this time.  The rectum appeared free of any  diverticula.  This all appeared very mobile.  The decision was made to  divide and reanastomosis the colon through the left lower quadrant incision.  We then opened with the lap disk completely and pulled the colon up through  the wounds.  The proximal division was taken at the mid descending colon.  This was performed with a GIA 75 stapler.  The mesentery was then ligated  between [facility] clamps and divided.  The mesentery was tied with 2-0 silk  ties.  Once we were below all the scarring and diverticuli, we divided the  colon with another load of the GIA stapler.  There was plenty of mobility  from the proximal stump down next to the distal stump.  Reinforcing sutures  of 2-0 silk were then placed to line up the 2 sides of the colon.   Small  colotomies were created with cautery.  The GIA 29 stapler was then used to  create an anastomosis.  The staple line was inspected, and no bleeding was  noted.  The colotomy was then closed with a TA 60 stapler.  Several more  reinforcing sutures of 2-0 and 3-0 silk sutures were placed.  The  anastomosis was palpated and felt to be widely patent.  Once we were happy  with the hemostasis, we dropped the anastomosis back into the abdomen and  closed the lap disk.  Pneumoperitoneum was then reinstated.  The laparoscope  was inserted, and we inspected the colon anastomosis.  This appeared to be  laying without any tension.  The pelvis was then thoroughly irrigated and  suctioned out.  We reinspected the splenic flexure bed.  No bleeding was  noted.  A sponge had previously been placed to pack the small bowel towards  the right side.  This was removed.  The lap disk was removed.  All the ports  were removed.  The left lower quadrant incision was closed with a running #1  PDS suture in 2 layers.  The pursestring suture was used to close the  subumbilical port site.  Staples were used to close the skin.  Dry dressings  were applied.  The patient was extubated and brought to the recovery room in  stable condition.  All sponge, instrument, and needle counts were correct.      Imogene Burn. Tsuei, M.D.  Electronically Signed     MKT/MEDQ  D:  02/05/2005  T:  02/05/2005  Job:  [id number]

## 2010-06-17 ENCOUNTER — Emergency Department (HOSPITAL_COMMUNITY)
Admission: EM | Admit: 2010-06-17 | Discharge: 2010-06-17 | Disposition: A | Discharge status: Home / Self Care | Payer: Medicare Other | Attending: Emergency Medicine | Admitting: Emergency Medicine

## 2010-06-17 ENCOUNTER — Emergency Department (HOSPITAL_COMMUNITY): Payer: Medicare Other

## 2010-06-17 DIAGNOSIS — K5730 Diverticulosis of large intestine without perforation or abscess without bleeding: Secondary | ICD-10-CM | POA: Insufficient documentation

## 2010-06-17 DIAGNOSIS — E785 Hyperlipidemia, unspecified: Secondary | ICD-10-CM | POA: Insufficient documentation

## 2010-06-17 DIAGNOSIS — I10 Essential (primary) hypertension: Secondary | ICD-10-CM | POA: Insufficient documentation

## 2010-06-17 DIAGNOSIS — R7309 Other abnormal glucose: Secondary | ICD-10-CM | POA: Insufficient documentation

## 2010-06-17 DIAGNOSIS — R109 Unspecified abdominal pain: Secondary | ICD-10-CM | POA: Insufficient documentation

## 2010-06-17 DIAGNOSIS — N289 Disorder of kidney and ureter, unspecified: Secondary | ICD-10-CM | POA: Insufficient documentation

## 2010-06-17 DIAGNOSIS — Z950 Presence of cardiac pacemaker: Secondary | ICD-10-CM | POA: Insufficient documentation

## 2010-06-17 DIAGNOSIS — R110 Nausea: Secondary | ICD-10-CM | POA: Insufficient documentation

## 2010-06-17 LAB — BASIC METABOLIC PANEL
BUN: 27 mg/dL — ABNORMAL HIGH (ref 6–23)
Chloride: 104 mEq/L (ref 96–112)
Creatinine, Ser: 1.67 mg/dL — ABNORMAL HIGH (ref 0.4–1.2)
Glucose, Bld: 143 mg/dL — ABNORMAL HIGH (ref 70–99)

## 2010-06-17 LAB — CBC
HCT: 40.2 % (ref 36.0–46.0)
MCH: 29.7 pg (ref 26.0–34.0)
MCV: 90.5 fL (ref 78.0–100.0)
Platelets: 162 10*3/uL (ref 150–400)
RBC: 4.44 MIL/uL (ref 3.87–5.11)
RDW: 13.7 % (ref 11.5–15.5)

## 2010-06-17 LAB — URINALYSIS, ROUTINE W REFLEX MICROSCOPIC
Bilirubin Urine: NEGATIVE
Ketones, ur: NEGATIVE mg/dL
Protein, ur: 100 mg/dL — AB
Specific Gravity, Urine: 1.025 (ref 1.005–1.030)
Urobilinogen, UA: 0.2 mg/dL (ref 0.0–1.0)

## 2010-06-17 LAB — DIFFERENTIAL
Eosinophils Absolute: 0.6 10*3/uL (ref 0.0–0.7)
Eosinophils Relative: 7 % — ABNORMAL HIGH (ref 0–5)
Lymphs Abs: 3.9 10*3/uL (ref 0.7–4.0)
Monocytes Relative: 8 % (ref 3–12)

## 2010-06-17 LAB — URINE MICROSCOPIC-ADD ON

## 2010-06-18 LAB — URINE CULTURE: Culture: NO GROWTH

## 2010-10-13 LAB — PROTIME-INR
INR: 1.2
Prothrombin Time: 15.4 — ABNORMAL HIGH

## 2010-10-13 LAB — CBC
HCT: 35.7 — ABNORMAL LOW
HCT: 39.7
Hemoglobin: 12.1
Hemoglobin: 13.3
MCHC: 33.8
MCV: 92.2
Platelets: 118 — ABNORMAL LOW
RBC: 4.3
RDW: 13.4
RDW: 13.5
WBC: 13.8 — ABNORMAL HIGH

## 2010-10-13 LAB — DIFFERENTIAL
Basophils Absolute: 0
Basophils Relative: 0
Eosinophils Relative: 1
Lymphocytes Relative: 16
Lymphocytes Relative: 9 — ABNORMAL LOW
Lymphs Abs: 1.2
Monocytes Absolute: 0.7
Monocytes Absolute: 0.8
Monocytes Relative: 5
Monocytes Relative: 7
Neutro Abs: 11.7 — ABNORMAL HIGH
Neutro Abs: 8.7 — ABNORMAL HIGH
Neutrophils Relative %: 75

## 2010-10-13 LAB — COMPREHENSIVE METABOLIC PANEL
Albumin: 2.6 — ABNORMAL LOW
Alkaline Phosphatase: 55
BUN: 20
Creatinine, Ser: 1.49 — ABNORMAL HIGH
Glucose, Bld: 82
Potassium: 3.7
Total Bilirubin: 0.8
Total Protein: 5.3 — ABNORMAL LOW

## 2010-10-13 LAB — BASIC METABOLIC PANEL
Calcium: 8.4
GFR calc Af Amer: 37 — ABNORMAL LOW
GFR calc non Af Amer: 30 — ABNORMAL LOW
Glucose, Bld: 112 — ABNORMAL HIGH
Potassium: 3.3 — ABNORMAL LOW
Sodium: 137

## 2010-10-13 LAB — PHOSPHORUS: Phosphorus: 2.8

## 2010-10-19 ENCOUNTER — Other Ambulatory Visit (HOSPITAL_COMMUNITY): Payer: Self-pay | Admitting: Family Medicine

## 2010-10-19 DIAGNOSIS — Z139 Encounter for screening, unspecified: Secondary | ICD-10-CM

## 2010-10-22 ENCOUNTER — Ambulatory Visit (HOSPITAL_COMMUNITY)
Admission: RE | Admit: 2010-10-22 | Discharge: 2010-10-22 | Disposition: A | Discharge status: Home / Self Care | Payer: Medicare Other | Source: Ambulatory Visit | Attending: Family Medicine | Admitting: Family Medicine

## 2010-10-22 DIAGNOSIS — Z780 Asymptomatic menopausal state: Secondary | ICD-10-CM | POA: Insufficient documentation

## 2010-10-22 DIAGNOSIS — Z139 Encounter for screening, unspecified: Secondary | ICD-10-CM

## 2010-10-22 DIAGNOSIS — M818 Other osteoporosis without current pathological fracture: Secondary | ICD-10-CM | POA: Insufficient documentation

## 2010-11-13 ENCOUNTER — Encounter: Payer: Self-pay | Admitting: Internal Medicine

## 2010-11-18 ENCOUNTER — Encounter: Payer: Medicare Other | Admitting: Internal Medicine

## 2010-12-07 ENCOUNTER — Ambulatory Visit (INDEPENDENT_AMBULATORY_CARE_PROVIDER_SITE_OTHER): Payer: Medicare Other | Admitting: Internal Medicine

## 2010-12-07 ENCOUNTER — Encounter: Payer: Self-pay | Admitting: Internal Medicine

## 2010-12-07 VITALS — BP 147/87 | HR 67 | Ht 63.25 in | Wt 165.0 lb

## 2010-12-07 DIAGNOSIS — I10 Essential (primary) hypertension: Secondary | ICD-10-CM

## 2010-12-07 DIAGNOSIS — I495 Sick sinus syndrome: Secondary | ICD-10-CM

## 2010-12-07 DIAGNOSIS — F17200 Nicotine dependence, unspecified, uncomplicated: Secondary | ICD-10-CM

## 2010-12-07 DIAGNOSIS — Z950 Presence of cardiac pacemaker: Secondary | ICD-10-CM

## 2010-12-07 LAB — PACEMAKER DEVICE OBSERVATION
BATTERY VOLTAGE: 2.72 V
RV LEAD AMPLITUDE: 5 mv
RV LEAD THRESHOLD: 2 V

## 2010-12-07 NOTE — Patient Instructions (Signed)
Your physician recommends that you schedule a follow-up appointment in: 6 months for a device check and in 1 year with Dr. Lovena Le

## 2010-12-07 NOTE — Assessment & Plan Note (Signed)
Her device is working normally. Will recheck in several months. 

## 2010-12-07 NOTE — Progress Notes (Signed)
HPI Ashley Olsen returns today for followup. She is a very pleasant 77 year old woman with a history of symptomatic bradycardia, status post pacemaker insertion, a history of chronic atrial fibrillation, and chronic renal insufficiency. The patient has been trying to get into a nephrologist for the last several weeks. She denies peripheral edema or shortness of breath. She has rare palpitations. She has had no syncope. She notes that she stopped smoking cigarettes approximately 2 months ago. Allergies  Allergen Reactions  . Codeine   . Sulfonamide Derivatives      Current Outpatient Prescriptions  Medication Sig Dispense Refill  . alendronate (FOSAMAX) 70 MG tablet Take 70 mg by mouth every 7 (seven) days. Take with a full glass of water on an empty stomach.       Marland Kitchen amLODipine (NORVASC) 10 MG tablet Take 5 mg by mouth daily.       . potassium chloride (MICRO-K) 10 MEQ CR capsule Take 10 mEq by mouth daily. As needed       . simvastatin (ZOCOR) 20 MG tablet Take 20 mg by mouth at bedtime.        . Vitamin D, Ergocalciferol, (DRISDOL) 50000 UNITS CAPS Take 50,000 Units by mouth every 7 (seven) days.           Past Medical History  Diagnosis Date  . Hypertension   . Hyperlipidemia   . Pacemaker   . Sinoatrial node dysfunction   . Pain in joint, shoulder region   . Blood in urine   . History of blood clots     ROS:   All systems reviewed and negative except as noted in the HPI.   Past Surgical History  Procedure Date  . Thyroid surgery   . Diverticulitis   . Kidney stones   . Pcm   . Foot surgery      Family History  Problem Relation Age of Onset  . Cancer       History   Social History  . Marital Status: Divorced    Spouse Name: N/A    Number of Children: N/A  . Years of Education: N/A   Occupational History  . Not on file.   Social History Main Topics  . Smoking status: Former Research scientist (life sciences)  . Smokeless tobacco: Not on file   Comment: quit 2012  . Alcohol  Use: No  . Drug Use: No  . Sexually Active: Not on file   Other Topics Concern  . Not on file   Social History Narrative  . No narrative on file     BP 147/87  Pulse 67  Ht 5' 3.25" (1.607 m)  Wt 74.844 kg (165 lb)  BMI 29.00 kg/m2  Physical Exam:  Well appearing 77 yo woman, NAD HEENT: Unremarkable Neck:  No JVD, no thyromegally Lungs:  Clear with no wheezes, rales, or rhonchi. Well healed PPM incision. HEART:  Regular rate rhythm, no murmurs, no rubs, no clicks Abd:  soft, positive bowel sounds, no organomegally, no rebound, no guarding Ext:  2 plus pulses, no edema, no cyanosis, no clubbing Skin:  No rashes no nodules Neuro:  CN II through XII intact, motor grossly intact  DEVICE  Normal device function.  See PaceArt for details.   Assess/Plan:

## 2010-12-07 NOTE — Assessment & Plan Note (Signed)
Her blood pressure is elevated modestly. She will continue her current meds. I have asked her to maintain a low sodium diet.

## 2010-12-07 NOTE — Assessment & Plan Note (Signed)
She has stopped smoking! She will hopefully continue to remain tobacco free.

## 2010-12-24 ENCOUNTER — Encounter: Payer: Self-pay | Admitting: Internal Medicine

## 2011-01-01 ENCOUNTER — Encounter: Payer: Self-pay | Admitting: Internal Medicine

## 2011-02-15 DIAGNOSIS — I10 Essential (primary) hypertension: Secondary | ICD-10-CM | POA: Diagnosis not present

## 2011-02-15 DIAGNOSIS — R319 Hematuria, unspecified: Secondary | ICD-10-CM | POA: Diagnosis not present

## 2011-02-15 DIAGNOSIS — Z79899 Other long term (current) drug therapy: Secondary | ICD-10-CM | POA: Diagnosis not present

## 2011-02-15 DIAGNOSIS — D649 Anemia, unspecified: Secondary | ICD-10-CM | POA: Diagnosis not present

## 2011-02-15 DIAGNOSIS — N189 Chronic kidney disease, unspecified: Secondary | ICD-10-CM | POA: Diagnosis not present

## 2011-02-17 ENCOUNTER — Other Ambulatory Visit (HOSPITAL_COMMUNITY): Payer: Self-pay | Admitting: Nephrology

## 2011-02-17 DIAGNOSIS — N289 Disorder of kidney and ureter, unspecified: Secondary | ICD-10-CM

## 2011-03-15 ENCOUNTER — Ambulatory Visit (HOSPITAL_COMMUNITY)
Admission: RE | Admit: 2011-03-15 | Discharge: 2011-03-15 | Disposition: A | Discharge status: Home / Self Care | Payer: Medicare Other | Source: Ambulatory Visit | Attending: Nephrology | Admitting: Nephrology

## 2011-03-15 DIAGNOSIS — N289 Disorder of kidney and ureter, unspecified: Secondary | ICD-10-CM | POA: Diagnosis not present

## 2011-03-15 DIAGNOSIS — I10 Essential (primary) hypertension: Secondary | ICD-10-CM | POA: Diagnosis not present

## 2011-03-15 DIAGNOSIS — N189 Chronic kidney disease, unspecified: Secondary | ICD-10-CM | POA: Diagnosis not present

## 2011-03-15 DIAGNOSIS — N281 Cyst of kidney, acquired: Secondary | ICD-10-CM | POA: Diagnosis not present

## 2011-03-15 DIAGNOSIS — R9389 Abnormal findings on diagnostic imaging of other specified body structures: Secondary | ICD-10-CM | POA: Insufficient documentation

## 2011-03-26 ENCOUNTER — Emergency Department (HOSPITAL_COMMUNITY)
Admission: EM | Admit: 2011-03-26 | Discharge: 2011-03-26 | Disposition: A | Discharge status: Home / Self Care | Payer: Medicare Other | Attending: Emergency Medicine | Admitting: Emergency Medicine

## 2011-03-26 ENCOUNTER — Emergency Department (HOSPITAL_COMMUNITY): Payer: Medicare Other

## 2011-03-26 ENCOUNTER — Encounter (HOSPITAL_COMMUNITY): Payer: Self-pay

## 2011-03-26 DIAGNOSIS — S0003XA Contusion of scalp, initial encounter: Secondary | ICD-10-CM | POA: Diagnosis not present

## 2011-03-26 DIAGNOSIS — Z79899 Other long term (current) drug therapy: Secondary | ICD-10-CM | POA: Insufficient documentation

## 2011-03-26 DIAGNOSIS — Z950 Presence of cardiac pacemaker: Secondary | ICD-10-CM | POA: Diagnosis not present

## 2011-03-26 DIAGNOSIS — R51 Headache: Secondary | ICD-10-CM | POA: Diagnosis not present

## 2011-03-26 DIAGNOSIS — I10 Essential (primary) hypertension: Secondary | ICD-10-CM | POA: Diagnosis not present

## 2011-03-26 DIAGNOSIS — W208XXA Other cause of strike by thrown, projected or falling object, initial encounter: Secondary | ICD-10-CM | POA: Insufficient documentation

## 2011-03-26 DIAGNOSIS — E785 Hyperlipidemia, unspecified: Secondary | ICD-10-CM | POA: Diagnosis not present

## 2011-03-26 DIAGNOSIS — S0990XA Unspecified injury of head, initial encounter: Secondary | ICD-10-CM | POA: Insufficient documentation

## 2011-03-26 DIAGNOSIS — S1093XA Contusion of unspecified part of neck, initial encounter: Secondary | ICD-10-CM | POA: Insufficient documentation

## 2011-03-26 DIAGNOSIS — Z9889 Other specified postprocedural states: Secondary | ICD-10-CM | POA: Diagnosis not present

## 2011-03-26 LAB — POCT I-STAT, CHEM 8
BUN: 31 mg/dL — ABNORMAL HIGH (ref 6–23)
Chloride: 110 mEq/L (ref 96–112)
Creatinine, Ser: 1.9 mg/dL — ABNORMAL HIGH (ref 0.50–1.10)
Hemoglobin: 15 g/dL (ref 12.0–15.0)
Potassium: 4.2 mEq/L (ref 3.5–5.1)
Sodium: 142 mEq/L (ref 135–145)

## 2011-03-26 NOTE — Discharge Instructions (Signed)
RESOURCE GUIDE  Dental Problems  Patients with Medicaid: [place] [facility] Phone:  [phone]                                                  Phone:  [phone]  If unable to pay or uninsured, contact:  [facility] or [facility]. to become qualified for the adult dental clinic.  Chronic Pain Problems Contact [facility]  [phone] Patients need to be referred by their primary care doctor.  Insufficient Money for Medicine Contact [employer]:  call "211" or [place] [phone].  No Primary Care Doctor Call [facility]  [phone] Other agencies that provide inexpensive medical care    [place]  [phone]    [facility]  [place]  [phone]    [facility]  [phone]    [facility]  [place]  [place]  [phone] [place]   [phone] (emergency services [phone])  [facility]  [phone] [facility] [phone] [facility] [place] [phone] [place] [phone] Residential & Outpatient Substance Abuse Program  [phone]  Abuse/Neglect [place] [phone] [place] [phone] (After Hours)  Emergency [place] [phone]  [place] [facility] [place] [phone] [place] [phone]  MRSA Hotline #:   727-235-4053    [facility] Dept. [address]      [place] Phone:  [phone]                                   Phone:  [phone]                 Phone:  [place] Phone:  [phone]  [place] [phone] (After Hours)   Take your usual prescriptions as previously directed.  Your CT scan did not show any "bleeding" or "swelling" in your brain; but it did have an incidental finding of:  "There is a rounded hyperdensity in the anterior interhemispheric fissure measuring 3 mm in diameter. It is possible this could represent a pericallosal aneurysm. This is not a definite finding, and if present, is of questionable significance."  Call your regular medical doctor and the Neurosurgeon on Monday to schedule a follow up appointment within the next week to further investigate this finding.  Return to the Emergency Department immediately sooner if worsening.

## 2011-03-26 NOTE — ED Provider Notes (Signed)
History     CSN: [id number]  Arrival date & time 03/26/11  1250   First MD Initiated Contact with Patient 03/26/11 1314      Chief Complaint  Patient presents with  . Head Injury    HPI Pt was seen at 1325.  Per pt, c/o sudden onset and resolution of one episode of being hit on the head with a wooden clock that fell off a [place] cabinet 3 days ago.  Pt states the clock hit her on her left posterior parietal/occipital scalp.  Denies open wounds, denies LOC, no AMS.  States she had a localized "sore spot" on her scalp where the clock hit it for that day, then it improved.  States she also "bit my lip."  States her family "made me come in" today for a CT scan.  Denies any complaints today.  Denies headache, no visual changes, no focal motor weakness, no tingling/numbness in extremities, no neck or back pain.   Past Medical History  Diagnosis Date  . Hypertension   . Hyperlipidemia   . Pacemaker   . Sinoatrial node dysfunction   . Pain in joint, shoulder region   . Blood in urine   . History of blood clots     Past Surgical History  Procedure Date  . Thyroid surgery   . Diverticulitis   . Kidney stones   . Pcm   . Foot surgery     Family History  Problem Relation Age of Onset  . Cancer      History  Substance Use Topics  . Smoking status: Former Smoker -- 1.0 packs/day  . Smokeless tobacco: Not on file   Comment: quit 2012  . Alcohol Use: No    Review of Systems ROS: Statement: All systems negative except as marked or noted in the HPI; Constitutional: Negative for fever and chills. ; ; Eyes: Negative for eye pain, redness and discharge. ; ; ENMT: Negative for ear pain, hoarseness, nasal congestion, sinus pressure and sore throat. ; ; Cardiovascular: Negative for chest pain, palpitations, diaphoresis, dyspnea and peripheral edema. ; ; Respiratory: Negative for cough, wheezing and stridor. ; ; Gastrointestinal: Negative for nausea, vomiting, diarrhea, abdominal pain, blood  in stool, hematemesis, jaundice and rectal bleeding. . ; ; Genitourinary: Negative for dysuria, flank pain and hematuria. ; ; Musculoskeletal: Negative for back pain and neck pain. Negative for swelling and trauma.; ; Skin: +lip bruise. Negative for pruritus, rash, abrasions, blisters, and skin lesion.; ; Neuro: Negative for lightheadedness and neck stiffness. Negative for weakness, altered level of consciousness , altered mental status, extremity weakness, paresthesias, involuntary movement, seizure and syncope.     Allergies  Codeine and Sulfonamide derivatives  Home Medications   Current Outpatient Rx  Name Route Sig Dispense Refill  . AMLODIPINE BESYLATE 10 MG PO TABS Oral Take 5 mg by mouth daily.     Marland Kitchen SIMVASTATIN 20 MG PO TABS Oral Take 20 mg by mouth at bedtime.        BP 145/85  Pulse 73  Temp(Src) 97.8 F (36.6 C) (Oral)  Resp 18  Ht 5\' 2"  (1.575 m)  Wt 170 lb (77.111 kg)  BMI 31.09 kg/m2  SpO2 99%  Physical Exam 1330: Physical examination:  Nursing notes reviewed; Vital signs and O2 SAT reviewed;  Constitutional: Well developed, Well nourished, Well hydrated, In no acute distress; Head:  Normocephalic, atraumatic, no scalp hematoma, no open wounds; Eyes: EOMI, PERRL, No scleral icterus; ENMT: +small ecchymosis to left lower  lip, no open wounds. Mouth and pharynx normal, Mucous membranes moist; Neck: Supple, Full range of motion, No lymphadenopathy; Cardiovascular: Regular rate and rhythm, No murmur, rub, or gallop; Respiratory: Breath sounds clear & equal bilaterally, No rales, rhonchi, wheezes, or rub, Normal respiratory effort/excursion; Chest: Nontender, Movement normal; Abdomen: Soft, Nontender, Nondistended, Normal bowel sounds; Extremities: Pulses normal, No tenderness, No edema, No calf edema or asymmetry.; Neuro: AA&Ox3, Major CN grossly intact. No facial droop, speech clear.  DTR 2/4 equal bilat UE's and LE's.  Strength 5/5 equal bilat UE's and LE's.  Normal  coordination.  No gross focal motor or sensory deficits in extremities.; Skin: Color normal, Warm, Dry, no rash.    ED Course  Procedures    MDM  MDM Reviewed: nursing note and vitals Interpretation: CT scan and labs   Results for orders placed during the hospital encounter of 03/26/11  POCT I-STAT, CHEM 8      Component Value Range   Sodium 142  135 - 145 (mEq/L)   Potassium 4.2  3.5 - 5.1 (mEq/L)   Chloride 110  96 - 112 (mEq/L)   BUN 31 (*) 6 - 23 (mg/dL)   Creatinine, Ser 1.90 (*) 0.50 - 1.10 (mg/dL)   Glucose, Bld 83  70 - 99 (mg/dL)   Calcium, Ion 1.16  1.12 - 1.32 (mmol/L)   TCO2 25  0 - 100 (mmol/L)   Hemoglobin 15.0  12.0 - 15.0 (g/dL)   HCT 44.0  36.0 - 46.0 (%)   Results for MAVIS, KARNITZ (MRN [id number]) as of 03/28/2011 12:24  Ref. Range 04/19/2008 09:08 01/11/2010 16:53 06/17/2010 03:02 03/26/2011 16:10  BUN Latest Range: 6-23 mg/dL 23 17 27  (H) 31 (H)  Creat Latest Range: 0.50-1.10 mg/dL 2.02 (H) 1.49 (H) 1.67 (H) 1.90 (H)    Ct Head Wo Contrast 03/26/2011  *RADIOLOGY REPORT*  Clinical Data: Recent head trauma.  Headache.  CT HEAD WITHOUT CONTRAST  Technique:  Contiguous axial images were obtained from the base of the skull through the vertex without contrast.  Comparison: None.  Findings: The brain shows age related atrophy.  There is no sign of old or acute focal small or large vessel infarction.  No intracranial mass lesion, hemorrhage, hydrocephalus or extra-axial collection.  There is a rounded hyperdensity in the anterior interhemispheric fissure measuring 3 mm in diameter.  It is possible this could represent a pericallosal aneurysm.  This is not a definite finding, and if present, is of questionable significance.  There is no skull fracture.  No fluid in the sinuses, middle ears or mastoids except for a very small amount of fluid in the sphenoid sinus.  Incidental osteoma in the right ethmoid sinus region.  IMPRESSION: No acute intracranial finding related to  trauma.  3 mm rounded hyperdensity in the anterior interhemispheric fissure. It is possible this could represent a pericallosal aneurysm.  This is not definite.  See above discussion.  If further workup is desired, MR angiography would be most useful.  Original Report Authenticated By: Jules Schick, M.D.     1655:     T/C to Neurosurgery Dr. Sherwood Gambler, case discussed, including:  HPI, pertinent PM/SHx, VS/PE, dx testing, ED course and treatment:  He has personally reviewed CT images and is aware that I cannot get MRA due to pt's pacemaker nor CT-A due to pt's elevated Cr, he requests to have pt f/u in ofc with Dr. Christella Noa non-urgently, as aneurysm is very small and risk of rupture is very low (  approx 1%/year) and likely will not require intervention given it's size, pt's age and her intact neuro exam.  Pt wants to go home now.  VS remain stable, neuro exam unchanged, continues to deny headache.  Dx testing, as well as d/w Neurosurgeon, d/w pt.  Questions answered.  Verb understanding, agreeable to d/c home with outpt f/u.        Alfonzo Feller, DO 03/28/11 1227

## 2011-03-26 NOTE — ED Notes (Signed)
Had a wooden clock fall on top of head 3 days ago, has headache off/on.  Denies any headache at this time, daughter made her come for eval-"since I didn't get a bump on my head, i may have internal swelling"

## 2011-03-26 NOTE — ED Notes (Signed)
Pt unable to have MRI due to pacemaker. Pt states she does not want to have blood work because she just had blood drawn at her doctors office recently

## 2011-03-26 NOTE — ED Notes (Signed)
Pt states she needs to go, she has plans at 6 pm and she has to go home to get ready

## 2011-03-30 ENCOUNTER — Other Ambulatory Visit (HOSPITAL_COMMUNITY): Payer: Self-pay | Admitting: Family Medicine

## 2011-03-30 DIAGNOSIS — I729 Aneurysm of unspecified site: Secondary | ICD-10-CM

## 2011-03-30 DIAGNOSIS — Z6831 Body mass index (BMI) 31.0-31.9, adult: Secondary | ICD-10-CM | POA: Diagnosis not present

## 2011-03-30 DIAGNOSIS — W19XXXA Unspecified fall, initial encounter: Secondary | ICD-10-CM

## 2011-04-01 ENCOUNTER — Ambulatory Visit (HOSPITAL_COMMUNITY)
Admission: RE | Admit: 2011-04-01 | Discharge: 2011-04-01 | Disposition: A | Discharge status: Home / Self Care | Payer: Medicare Other | Source: Ambulatory Visit | Attending: Family Medicine | Admitting: Family Medicine

## 2011-04-01 ENCOUNTER — Other Ambulatory Visit (HOSPITAL_COMMUNITY): Payer: Self-pay | Admitting: Interventional Radiology

## 2011-04-01 DIAGNOSIS — W19XXXA Unspecified fall, initial encounter: Secondary | ICD-10-CM

## 2011-04-01 DIAGNOSIS — I10 Essential (primary) hypertension: Secondary | ICD-10-CM | POA: Diagnosis not present

## 2011-04-01 DIAGNOSIS — N289 Disorder of kidney and ureter, unspecified: Secondary | ICD-10-CM | POA: Diagnosis not present

## 2011-04-01 DIAGNOSIS — I729 Aneurysm of unspecified site: Secondary | ICD-10-CM

## 2011-04-01 DIAGNOSIS — R51 Headache: Secondary | ICD-10-CM | POA: Diagnosis not present

## 2011-04-05 ENCOUNTER — Encounter (HOSPITAL_COMMUNITY): Payer: Self-pay | Admitting: Pharmacy Technician

## 2011-04-05 ENCOUNTER — Other Ambulatory Visit: Payer: Self-pay | Admitting: Radiology

## 2011-04-07 ENCOUNTER — Other Ambulatory Visit: Payer: Self-pay | Admitting: Radiology

## 2011-04-08 ENCOUNTER — Encounter (HOSPITAL_COMMUNITY): Payer: Self-pay

## 2011-04-08 ENCOUNTER — Ambulatory Visit (HOSPITAL_COMMUNITY)
Admission: RE | Admit: 2011-04-08 | Discharge: 2011-04-08 | Disposition: A | Discharge status: Home / Self Care | Payer: Medicare Other | Source: Ambulatory Visit | Attending: Interventional Radiology | Admitting: Interventional Radiology

## 2011-04-08 ENCOUNTER — Other Ambulatory Visit (HOSPITAL_COMMUNITY): Payer: Self-pay | Admitting: Interventional Radiology

## 2011-04-08 DIAGNOSIS — E785 Hyperlipidemia, unspecified: Secondary | ICD-10-CM | POA: Diagnosis not present

## 2011-04-08 DIAGNOSIS — I729 Aneurysm of unspecified site: Secondary | ICD-10-CM

## 2011-04-08 DIAGNOSIS — Z950 Presence of cardiac pacemaker: Secondary | ICD-10-CM | POA: Insufficient documentation

## 2011-04-08 DIAGNOSIS — I6529 Occlusion and stenosis of unspecified carotid artery: Secondary | ICD-10-CM | POA: Diagnosis not present

## 2011-04-08 DIAGNOSIS — I10 Essential (primary) hypertension: Secondary | ICD-10-CM | POA: Insufficient documentation

## 2011-04-08 DIAGNOSIS — R51 Headache: Secondary | ICD-10-CM | POA: Insufficient documentation

## 2011-04-08 LAB — CBC
HCT: 44.3 % (ref 36.0–46.0)
Hemoglobin: 14.7 g/dL (ref 12.0–15.0)
RDW: 13.5 % (ref 11.5–15.5)
WBC: 7.7 10*3/uL (ref 4.0–10.5)

## 2011-04-08 LAB — BASIC METABOLIC PANEL
Chloride: 109 mEq/L (ref 96–112)
GFR calc Af Amer: 35 mL/min — ABNORMAL LOW (ref >90)
Potassium: 3.9 mEq/L (ref 3.5–5.1)

## 2011-04-08 LAB — DIFFERENTIAL
Basophils Absolute: 0 10*3/uL (ref 0.0–0.1)
Lymphocytes Relative: 37 % (ref 12–46)
Monocytes Absolute: 0.6 10*3/uL (ref 0.1–1.0)
Monocytes Relative: 8 % (ref 3–12)
Neutro Abs: 3.7 10*3/uL (ref 1.7–7.7)

## 2011-04-08 LAB — APTT: aPTT: 32 seconds (ref 24–37)

## 2011-04-08 LAB — PROTIME-INR: INR: 0.94 (ref 0.00–1.49)

## 2011-04-08 MED ORDER — FENTANYL CITRATE 0.05 MG/ML IJ SOLN
INTRAMUSCULAR | Status: AC
  Filled 2011-04-08: qty 4

## 2011-04-08 MED ORDER — HEPARIN SODIUM (PORCINE) 1000 UNIT/ML IJ SOLN
INTRAMUSCULAR | Status: AC | PRN
  Administered 2011-04-08 (×2): 500 [IU] via INTRAVENOUS

## 2011-04-08 MED ORDER — MIDAZOLAM HCL 2 MG/2ML IJ SOLN
INTRAMUSCULAR | Status: AC
  Filled 2011-04-08: qty 4

## 2011-04-08 MED ORDER — SODIUM CHLORIDE 0.9 % IV SOLN: INTRAVENOUS | Status: AC

## 2011-04-08 MED ORDER — MIDAZOLAM HCL 5 MG/5ML IJ SOLN
INTRAMUSCULAR | Status: AC | PRN
  Administered 2011-04-08: 0.5 mg via INTRAVENOUS
  Administered 2011-04-08: 1 mg via INTRAVENOUS

## 2011-04-08 MED ORDER — HYDRALAZINE HCL 20 MG/ML IJ SOLN: 5.0000 mg | Freq: Once | INTRAMUSCULAR | Status: DC

## 2011-04-08 MED ORDER — HYDRALAZINE HCL 20 MG/ML IJ SOLN
INTRAMUSCULAR | Status: AC
  Administered 2011-04-08: 5 mg via INTRAVENOUS
  Filled 2011-04-08: qty 1

## 2011-04-08 MED ORDER — FENTANYL CITRATE 0.05 MG/ML IJ SOLN
INTRAMUSCULAR | Status: AC | PRN
  Administered 2011-04-08: 25 ug via INTRAVENOUS
  Administered 2011-04-08: 12.5 ug via INTRAVENOUS

## 2011-04-08 MED ORDER — SODIUM CHLORIDE 0.9 % IV SOLN
Freq: Once | INTRAVENOUS | Status: AC
  Administered 2011-04-08: 07:00:00 via INTRAVENOUS

## 2011-04-08 MED ORDER — IOHEXOL 300 MG/ML  SOLN
150.0000 mL | Freq: Once | INTRAMUSCULAR | Status: AC | PRN
  Administered 2011-04-08: 42 mL via INTRAVENOUS

## 2011-04-08 NOTE — H&P (Signed)
Ashley Olsen is an 78 y.o. female.   Chief Complaint: head injury at home; abn CTA shows small aneurysm HPI: scheduled now for Cerebral arteriogram to evaluate findings  Past Medical History  Diagnosis Date  . Hypertension   . Hyperlipidemia   . Pacemaker   . Sinoatrial node dysfunction   . Pain in joint, shoulder region   . Blood in urine   . History of blood clots     Past Surgical History  Procedure Date  . Thyroid surgery   . Diverticulitis   . Kidney stones   . Pcm   . Foot surgery     Family History  Problem Relation Age of Onset  . Cancer     Social History:  reports that she has quit smoking. She does not have any smokeless tobacco history on file. She reports that she does not drink alcohol or use illicit drugs.  Allergies:  Allergies  Allergen Reactions  . Codeine   . Sulfonamide Derivatives     Medications Prior to Admission  Medication Sig Dispense Refill  . amLODipine (NORVASC) 10 MG tablet Take 5 mg by mouth daily.       . simvastatin (ZOCOR) 20 MG tablet Take 20 mg by mouth at bedtime.        Marland Kitchen DISCONTD: potassium chloride (MICRO-K) 10 MEQ CR capsule Take 10 mEq by mouth daily. As needed        Medications Prior to Admission  Medication Dose Route Frequency Provider Last Rate Last Dose  . 0.9 %  sodium chloride infusion   Intravenous Once Ashley Drafts, PA 20 mL/hr at 04/08/11 [id number]      Results for orders placed during the hospital encounter of 04/08/11 (from the past 48 hour(s))  APTT     Status: Normal   Collection Time   04/08/11  6:40 AM      Component Value Range Comment   aPTT 32  24 - 37 (seconds)   CBC     Status: Normal   Collection Time   04/08/11  6:40 AM      Component Value Range Comment   WBC 7.7  4.0 - 10.5 (K/uL)    RBC 4.92  3.87 - 5.11 (MIL/uL)    Hemoglobin 14.7  12.0 - 15.0 (g/dL)    HCT 44.3  36.0 - 46.0 (%)    MCV 90.0  78.0 - 100.0 (fL)    MCH 29.9  26.0 - 34.0 (pg)    MCHC 33.2  30.0 - 36.0 (g/dL)    RDW  13.5  11.5 - 15.5 (%)    Platelets 165  150 - 400 (K/uL)   DIFFERENTIAL     Status: Abnormal   Collection Time   04/08/11  6:40 AM      Component Value Range Comment   Neutrophils Relative 48  43 - 77 (%)    Neutro Abs 3.7  1.7 - 7.7 (K/uL)    Lymphocytes Relative 37  12 - 46 (%)    Lymphs Abs 2.9  0.7 - 4.0 (K/uL)    Monocytes Relative 8  3 - 12 (%)    Monocytes Absolute 0.6  0.1 - 1.0 (K/uL)    Eosinophils Relative 7 (*) 0 - 5 (%)    Eosinophils Absolute 0.5  0.0 - 0.7 (K/uL)    Basophils Relative 1  0 - 1 (%)    Basophils Absolute 0.0  0.0 - 0.1 (K/uL)   PROTIME-INR  Status: Normal   Collection Time   04/08/11  6:40 AM      Component Value Range Comment   Prothrombin Time 12.8  11.6 - 15.2 (seconds)    INR 0.94  0.00 - 1.49     No results found.  Review of Systems  Constitutional: Negative for fever.  Respiratory: Negative for cough.   Cardiovascular: Negative for chest pain.  Gastrointestinal: Negative for nausea and vomiting.  Neurological: Negative for headaches.    Blood pressure 189/92, pulse 70, temperature 97 F (36.1 C), resp. rate 18, height 5\' 3"  (1.6 m), weight 173 lb (78.472 kg), SpO2 97.00%. Physical Exam  Constitutional: She is oriented to person, place, and time. She appears well-developed and well-nourished.  HENT:  Head: Normocephalic.  Eyes: EOM are normal.  Neck: Normal range of motion.  Cardiovascular: Normal rate, regular rhythm and normal heart sounds.   No murmur heard. Respiratory: Effort normal and breath sounds normal. She has no wheezes.  GI: Soft. Bowel sounds are normal. There is no tenderness.  Musculoskeletal: Normal range of motion.  Neurological: She is alert and oriented to person, place, and time.  Skin: Skin is warm and dry.     Assessment/Plan Head injury at home; abnormal CTA (pt has pacemaker) Probable aneurysm Scheduled now for cerebral arteriogram  Pt aware of procedure benefits and risks and agreeable to  proceed. Consent signed.  Ashley Olsen A 04/08/2011, 7:34 AM

## 2011-04-08 NOTE — ED Notes (Signed)
Called for short stay bed placement.  None available at this time. Will be transported to nurses station until short stay bed available.

## 2011-04-08 NOTE — Discharge Instructions (Signed)

## 2011-04-08 NOTE — Procedures (Signed)
S/P 4 vessel cerebral arteriogram Preliminary findings  1.NO angio evidence of intracranial aneurysms,DAVF or AVM  2. Venous outflow WNLs

## 2011-04-08 NOTE — Progress Notes (Signed)
D/C instructions given, pt verbalized understanding. Pt walked in hallway with out difficulty. Right groin site stable.

## 2011-04-11 ENCOUNTER — Telehealth (HOSPITAL_COMMUNITY): Payer: Self-pay | Admitting: Radiology

## 2011-04-12 ENCOUNTER — Telehealth (HOSPITAL_COMMUNITY): Payer: Self-pay

## 2011-04-21 DIAGNOSIS — Z6830 Body mass index (BMI) 30.0-30.9, adult: Secondary | ICD-10-CM | POA: Diagnosis not present

## 2011-04-21 DIAGNOSIS — N189 Chronic kidney disease, unspecified: Secondary | ICD-10-CM | POA: Diagnosis not present

## 2011-04-21 DIAGNOSIS — I10 Essential (primary) hypertension: Secondary | ICD-10-CM | POA: Diagnosis not present

## 2011-04-27 DIAGNOSIS — I959 Hypotension, unspecified: Secondary | ICD-10-CM | POA: Diagnosis not present

## 2011-04-27 DIAGNOSIS — N200 Calculus of kidney: Secondary | ICD-10-CM | POA: Diagnosis not present

## 2011-04-27 DIAGNOSIS — R809 Proteinuria, unspecified: Secondary | ICD-10-CM | POA: Diagnosis not present

## 2011-05-03 DIAGNOSIS — E785 Hyperlipidemia, unspecified: Secondary | ICD-10-CM | POA: Diagnosis not present

## 2011-05-03 DIAGNOSIS — Z681 Body mass index (BMI) 19 or less, adult: Secondary | ICD-10-CM | POA: Diagnosis not present

## 2011-05-03 DIAGNOSIS — I10 Essential (primary) hypertension: Secondary | ICD-10-CM | POA: Diagnosis not present

## 2011-05-12 DIAGNOSIS — I129 Hypertensive chronic kidney disease with stage 1 through stage 4 chronic kidney disease, or unspecified chronic kidney disease: Secondary | ICD-10-CM | POA: Diagnosis not present

## 2011-05-12 DIAGNOSIS — I10 Essential (primary) hypertension: Secondary | ICD-10-CM | POA: Diagnosis not present

## 2011-05-12 DIAGNOSIS — N200 Calculus of kidney: Secondary | ICD-10-CM | POA: Diagnosis not present

## 2011-06-03 DIAGNOSIS — N2581 Secondary hyperparathyroidism of renal origin: Secondary | ICD-10-CM | POA: Diagnosis not present

## 2011-06-15 DIAGNOSIS — N200 Calculus of kidney: Secondary | ICD-10-CM | POA: Diagnosis not present

## 2011-06-15 DIAGNOSIS — I129 Hypertensive chronic kidney disease with stage 1 through stage 4 chronic kidney disease, or unspecified chronic kidney disease: Secondary | ICD-10-CM | POA: Diagnosis not present

## 2011-06-16 ENCOUNTER — Ambulatory Visit (INDEPENDENT_AMBULATORY_CARE_PROVIDER_SITE_OTHER): Payer: Medicare Other | Admitting: Physician Assistant

## 2011-06-16 ENCOUNTER — Encounter: Payer: Self-pay | Admitting: Physician Assistant

## 2011-06-16 VITALS — BP 148/90 | HR 72 | Resp 16 | Ht 63.0 in | Wt 169.0 lb

## 2011-06-16 DIAGNOSIS — I495 Sick sinus syndrome: Secondary | ICD-10-CM

## 2011-06-16 DIAGNOSIS — R011 Cardiac murmur, unspecified: Secondary | ICD-10-CM | POA: Diagnosis not present

## 2011-06-16 DIAGNOSIS — R0602 Shortness of breath: Secondary | ICD-10-CM | POA: Diagnosis not present

## 2011-06-16 DIAGNOSIS — Z950 Presence of cardiac pacemaker: Secondary | ICD-10-CM | POA: Diagnosis not present

## 2011-06-16 DIAGNOSIS — I10 Essential (primary) hypertension: Secondary | ICD-10-CM

## 2011-06-16 NOTE — Progress Notes (Signed)
HPI:  This is a very pleasant 78 year old female patient who has a pacemaker for symptomatic bradycardia, chronic atrial fibrillation and chronic renal insufficiency who she sees Dr. Hassell Done for.  Patient comes in today complaining of worsening fatigue and dyspnea on exertion. She says she gets out of breath if she bends over to tie her shoes or with very little activity. She says this is the same way she felt prior to her pacemaker. She has stopped walking regularly but because of this. Her last pacer check 6 months ago she was told she was nearing end of life for the next 2 years. She is concerned this is happening more quickly.2 denies chest pain, palpitations, dizziness, or presyncope. Her blood pressure is elevated today but she states it's usually 134/70 at home.   -- Codeine   -- Sulfonamide Derivatives   Current Outpatient Prescriptions on File Prior to Visit: amLODipine (NORVASC) 10 MG tablet, Take 5 mg by mouth daily. , Disp: , Rfl:   simvastatin (ZOCOR) 20 MG tablet, Take 20 mg by mouth at bedtime.  , Disp: , Rfl:  DISCONTD: potassium chloride (MICRO-K) 10 MEQ CR capsule, Take 10 mEq by mouth daily. As needed , Disp: , Rfl:     Past Medical History:   Hypertension                                                 Hyperlipidemia                                               Pacemaker                                                    Sinoatrial node dysfunction                                  Pain in joint, shoulder region                               Blood in urine                                               History of blood clots                                      Past Surgical History:   THYROID SURGERY                                              diverticulitis  kidney stones                                                pcm                                                          FOOT SURGERY                                                 Review of patient's family history indicates:   Cancer                                                  Social History   Marital Status: Divorced            Spouse Name:                      Years of Education:                 Number of children:             Occupational History Occupation          Fish farm manager            Comment              Retired                                   Social History Main Topics   Smoking Status: Former Smoker                   Packs/Day: 1     Years:         Smokeless Status: Not on file                      Comment: quit 2012   Alcohol Use: No             Drug Use: No             Sexual Activity: Yes                    Birth Control/Protection: None  Other Topics            Concern   None on file  Social History Narrative   None on file    ROS:see history of present illness otherwise negative   PHYSICAL EXAM: Well-nournished, in no acute distress. Neck: No JVD, HJR, Bruit, or thyroid enlargement  Lungs: No tachypnea, clear without wheezing, rales, or rhonchi  Cardiovascular: RRR, PMI not [id number] systolic murmur at the left sternal border and right sternal border, no bruit, thrill, or heave.  Abdomen: BS normal. Soft without organomegaly, masses, lesions or tenderness.  Extremities: without cyanosis, clubbing or edema. Good distal  pulses bilateral  SKin: Warm, no lesions or rashes   Musculoskeletal: No deformities  Neuro: no focal signs  BP 148/90  Pulse 72  Resp 16  Ht 5\' 3"  (1.6 m)  Wt 169 lb (76.658 kg)  BMI 29.94 kg/m2   MC:[id number] sinus rhythm with first degree AV block

## 2011-06-16 NOTE — Assessment & Plan Note (Signed)
We will order a 2-D echo to rule out aortic stenosis

## 2011-06-16 NOTE — Patient Instructions (Addendum)
**Note De-Identified  Obfuscation** Your physician has requested that you have an echocardiogram. Echocardiography is a painless test that uses sound waves to create images of your heart. It provides your doctor with information about the size and shape of your heart and how well your heart's chambers and valves are working. This procedure takes approximately one hour. There are no restrictions for this procedure.  Your physician recommends that you return for lab work in: today  Your physician recommends that you schedule a follow-up appointment in: 1 week

## 2011-06-16 NOTE — Assessment & Plan Note (Signed)
Patient's blood pressure is low on the high side today. She does take it regularly at home and it has been running normal. Recommend 2 g sodium diet and followup.

## 2011-06-16 NOTE — Assessment & Plan Note (Signed)
Patient has a pacemaker that is almost 76 years old. She is told that it was nearing end of life in the next 2 years. She is having symptoms of dyspnea on exertion and fatigue which are similar symptoms that she had prior to pacer insertion. We have scheduled appointment for her to see Dr. Lovena Le in [facility]. She also has a systolic murmur we will order a 2-D echo 4 to rule out AS.

## 2011-06-17 ENCOUNTER — Ambulatory Visit (HOSPITAL_COMMUNITY)
Admission: RE | Admit: 2011-06-17 | Discharge: 2011-06-17 | Disposition: A | Discharge status: Home / Self Care | Payer: Medicare Other | Source: Ambulatory Visit | Attending: Physician Assistant | Admitting: Physician Assistant

## 2011-06-17 DIAGNOSIS — E785 Hyperlipidemia, unspecified: Secondary | ICD-10-CM | POA: Diagnosis not present

## 2011-06-17 DIAGNOSIS — R0609 Other forms of dyspnea: Secondary | ICD-10-CM | POA: Diagnosis not present

## 2011-06-17 DIAGNOSIS — I359 Nonrheumatic aortic valve disorder, unspecified: Secondary | ICD-10-CM | POA: Diagnosis not present

## 2011-06-17 DIAGNOSIS — R011 Cardiac murmur, unspecified: Secondary | ICD-10-CM

## 2011-06-17 DIAGNOSIS — R0989 Other specified symptoms and signs involving the circulatory and respiratory systems: Secondary | ICD-10-CM | POA: Insufficient documentation

## 2011-06-17 DIAGNOSIS — I10 Essential (primary) hypertension: Secondary | ICD-10-CM | POA: Insufficient documentation

## 2011-06-17 DIAGNOSIS — F17200 Nicotine dependence, unspecified, uncomplicated: Secondary | ICD-10-CM | POA: Diagnosis not present

## 2011-06-17 DIAGNOSIS — R0602 Shortness of breath: Secondary | ICD-10-CM

## 2011-06-17 LAB — BASIC METABOLIC PANEL
Glucose, Bld: 72 mg/dL (ref 70–99)
Potassium: 4.3 mEq/L (ref 3.5–5.3)
Sodium: 145 mEq/L (ref 135–145)

## 2011-06-17 LAB — CBC WITH DIFFERENTIAL/PLATELET
HCT: 42.1 % (ref 36.0–46.0)
Hemoglobin: 14.2 g/dL (ref 12.0–15.0)
Lymphocytes Relative: 31 % (ref 12–46)
Lymphs Abs: 1.9 10*3/uL (ref 0.7–4.0)
MCHC: 33.7 g/dL (ref 30.0–36.0)
Monocytes Absolute: 0.5 10*3/uL (ref 0.1–1.0)
Monocytes Relative: 9 % (ref 3–12)
Neutro Abs: 3.2 10*3/uL (ref 1.7–7.7)
RBC: 4.71 MIL/uL (ref 3.87–5.11)

## 2011-06-17 LAB — BRAIN NATRIURETIC PEPTIDE: Brain Natriuretic Peptide: 201.7 pg/mL — ABNORMAL HIGH (ref 0.0–100.0)

## 2011-06-17 NOTE — Progress Notes (Signed)
*  PRELIMINARY RESULTS* Echocardiogram 2D Echocardiogram has been performed.  Wynonia Lawman 06/17/2011, 11:39 AM

## 2011-06-18 ENCOUNTER — Encounter: Payer: Self-pay | Admitting: Internal Medicine

## 2011-06-18 ENCOUNTER — Ambulatory Visit (INDEPENDENT_AMBULATORY_CARE_PROVIDER_SITE_OTHER): Payer: Medicare Other | Admitting: Internal Medicine

## 2011-06-18 VITALS — BP 140/72 | HR 64 | Ht 63.0 in | Wt 174.4 lb

## 2011-06-18 DIAGNOSIS — I495 Sick sinus syndrome: Secondary | ICD-10-CM

## 2011-06-18 DIAGNOSIS — Z950 Presence of cardiac pacemaker: Secondary | ICD-10-CM

## 2011-06-18 DIAGNOSIS — I10 Essential (primary) hypertension: Secondary | ICD-10-CM | POA: Diagnosis not present

## 2011-06-18 LAB — PACEMAKER DEVICE OBSERVATION
DEVICE MODEL PM: 15732
RV LEAD AMPLITUDE: 4 mv
RV LEAD THRESHOLD: 2 V

## 2011-06-18 NOTE — Assessment & Plan Note (Signed)
Her device is working normally. We'll plan to recheck in several months. 

## 2011-06-18 NOTE — Progress Notes (Signed)
HPI Mrs. Borchert returns today for followup. She is a very pleasant 78 year old woman with chronic atrial fibrillation, symptomatic bradycardia, status post pacemaker insertion. She has a history of stage III renal insufficiency. In the interim, she has done well. She denies palpitations or chest pain. She has class II dyspnea. She has minimal peripheral edema. No syncope, cough, or hemoptysis. Allergies  Allergen Reactions  . Codeine   . Sulfonamide Derivatives      Current Outpatient Prescriptions  Medication Sig Dispense Refill  . amLODipine (NORVASC) 10 MG tablet Take 5 mg by mouth daily.       . simvastatin (ZOCOR) 20 MG tablet Take 20 mg by mouth at bedtime.        Marland Kitchen DISCONTD: potassium chloride (MICRO-K) 10 MEQ CR capsule Take 10 mEq by mouth daily. As needed          Past Medical History  Diagnosis Date  . Hypertension   . Hyperlipidemia   . Pacemaker   . Sinoatrial node dysfunction   . Pain in joint, shoulder region   . Blood in urine   . History of blood clots     ROS:   All systems reviewed and negative except as noted in the HPI.   Past Surgical History  Procedure Date  . Thyroid surgery   . Diverticulitis   . Kidney stones   . Pcm   . Foot surgery      Family History  Problem Relation Age of Onset  . Cancer       History   Social History  . Marital Status: Divorced    Spouse Name: N/A    Number of Children: N/A  . Years of Education: N/A   Occupational History  . Retired    Social History Main Topics  . Smoking status: Former Smoker -- 1.0 packs/day  . Smokeless tobacco: Not on file   Comment: quit 2012  . Alcohol Use: No  . Drug Use: No  . Sexually Active: Yes    Birth Control/ Protection: None   Other Topics Concern  . Not on file   Social History Narrative  . No narrative on file     BP 140/72  Pulse 64  Ht 5\' 3"  (1.6 m)  Wt 174 lb 6.4 oz (79.107 kg)  BMI 30.89 kg/m2  Physical Exam:  Well appearing elderly woman,  NAD HEENT: Unremarkable Neck:  No JVD, no thyromegally Lungs:  Clear with no wheezes, rales, or rhonchi. HEART:  IRegular rate rhythm, no murmurs, no rubs, no clicks Abd:  soft, positive bowel sounds, no organomegally, no rebound, no guarding Ext:  2 plus pulses, no edema, no cyanosis, no clubbing Skin:  No rashes no nodules Neuro:  CN II through XII intact, motor grossly intact  DEVICE  Normal device function.  See PaceArt for details.   Assess/Plan:

## 2011-06-18 NOTE — Patient Instructions (Signed)
Your physician wants you to follow-up in: 1 year with Dr. Taylor. You will receive a reminder letter in the mail two months in advance. If you don't receive a letter, please call our office to schedule the follow-up appointment.  

## 2011-06-18 NOTE — Assessment & Plan Note (Signed)
Her blood pressure is slightly elevated today but her volume status is good. I've encouraged the patient to reduce her salt intake. The patient states that she likes date salt and I've encouraged her to reduce her intake.

## 2011-08-16 DIAGNOSIS — I129 Hypertensive chronic kidney disease with stage 1 through stage 4 chronic kidney disease, or unspecified chronic kidney disease: Secondary | ICD-10-CM | POA: Diagnosis not present

## 2011-09-21 DIAGNOSIS — H52229 Regular astigmatism, unspecified eye: Secondary | ICD-10-CM | POA: Diagnosis not present

## 2011-09-21 DIAGNOSIS — H5200 Hypermetropia, unspecified eye: Secondary | ICD-10-CM | POA: Diagnosis not present

## 2011-09-21 DIAGNOSIS — H2510 Age-related nuclear cataract, unspecified eye: Secondary | ICD-10-CM | POA: Diagnosis not present

## 2011-09-21 DIAGNOSIS — H524 Presbyopia: Secondary | ICD-10-CM | POA: Diagnosis not present

## 2011-10-26 ENCOUNTER — Emergency Department (HOSPITAL_COMMUNITY): Payer: Medicare Other

## 2011-10-26 ENCOUNTER — Emergency Department (HOSPITAL_COMMUNITY)
Admission: EM | Admit: 2011-10-26 | Discharge: 2011-10-26 | Disposition: A | Discharge status: Home / Self Care | Payer: Medicare Other | Attending: Emergency Medicine | Admitting: Emergency Medicine

## 2011-10-26 ENCOUNTER — Telehealth: Payer: Self-pay | Admitting: *Deleted

## 2011-10-26 ENCOUNTER — Encounter (HOSPITAL_COMMUNITY): Payer: Self-pay

## 2011-10-26 DIAGNOSIS — I10 Essential (primary) hypertension: Secondary | ICD-10-CM | POA: Diagnosis not present

## 2011-10-26 DIAGNOSIS — I4902 Ventricular flutter: Secondary | ICD-10-CM | POA: Diagnosis not present

## 2011-10-26 DIAGNOSIS — R0602 Shortness of breath: Secondary | ICD-10-CM | POA: Insufficient documentation

## 2011-10-26 DIAGNOSIS — R0989 Other specified symptoms and signs involving the circulatory and respiratory systems: Secondary | ICD-10-CM | POA: Insufficient documentation

## 2011-10-26 DIAGNOSIS — R0609 Other forms of dyspnea: Secondary | ICD-10-CM | POA: Insufficient documentation

## 2011-10-26 DIAGNOSIS — R0600 Dyspnea, unspecified: Secondary | ICD-10-CM

## 2011-10-26 DIAGNOSIS — R110 Nausea: Secondary | ICD-10-CM | POA: Insufficient documentation

## 2011-10-26 DIAGNOSIS — L509 Urticaria, unspecified: Secondary | ICD-10-CM | POA: Diagnosis not present

## 2011-10-26 LAB — BASIC METABOLIC PANEL
Calcium: 9.2 mg/dL (ref 8.4–10.5)
Chloride: 106 mEq/L (ref 96–112)
Creatinine, Ser: 1.74 mg/dL — ABNORMAL HIGH (ref 0.50–1.10)
GFR calc Af Amer: 31 mL/min — ABNORMAL LOW (ref >90)
Sodium: 139 mEq/L (ref 135–145)

## 2011-10-26 LAB — CBC WITH DIFFERENTIAL/PLATELET
Basophils Absolute: 0 10*3/uL (ref 0.0–0.1)
Basophils Relative: 1 % (ref 0–1)
HCT: 41.4 % (ref 36.0–46.0)
MCHC: 33.3 g/dL (ref 30.0–36.0)
Monocytes Absolute: 0.5 10*3/uL (ref 0.1–1.0)
Neutro Abs: 3.1 10*3/uL (ref 1.7–7.7)
Platelets: 155 10*3/uL (ref 150–400)
RDW: 13.1 % (ref 11.5–15.5)
WBC: 5.7 10*3/uL (ref 4.0–10.5)

## 2011-10-26 LAB — PRO B NATRIURETIC PEPTIDE: Pro B Natriuretic peptide (BNP): 912.2 pg/mL — ABNORMAL HIGH (ref 0–450)

## 2011-10-26 LAB — TROPONIN I: Troponin I: <0.3 ng/mL (ref <0.30)

## 2011-10-26 NOTE — ED Provider Notes (Signed)
History   This chart was scribed for Maudry Diego, MD by Hampton Abbot. This patient was seen in room APA14/[facility]14 and the patient's care was started at 17:04.   CSN: [id number]  Arrival date & time 10/26/11  1633   First MD Initiated Contact with Patient 10/26/11 1702      Chief Complaint  Patient presents with  . heart fluttering   . Nausea    (Consider location/radiation/quality/duration/timing/severity/associated sxs/prior treatment) The history is provided by the patient. No language interpreter was used.   Ashley Olsen is a 78 y.o. female who presents to the Emergency Department complaining of sudden onset nausea shortly after waking up this morning with associated dry heaves and dyspnea that is worsened by ambulation and resolved when lying flat.  Pt denies any chest pain, dizziness, syncope, palpitations, or sweating.  Pt reports h/o similar episodes of this combination of symptoms for which she reported here and was given unspecified meds.  Pt has a pacemaker.  Pt has h/o HTN.  Pt is a former smoker and denies alcohol use.    Past Medical History  Diagnosis Date  . Hypertension   . Hyperlipidemia   . Pacemaker   . Sinoatrial node dysfunction   . Pain in joint, shoulder region   . Blood in urine   . History of blood clots   . Renal disorder     Past Surgical History  Procedure Date  . Thyroid surgery   . Diverticulitis   . Kidney stones   . Pcm   . Foot surgery     Family History  Problem Relation Age of Onset  . Cancer      History  Substance Use Topics  . Smoking status: Former Smoker -- 1.0 packs/day  . Smokeless tobacco: Not on file   Comment: quit 2012  . Alcohol Use: No    No OB history provided.  Review of Systems  Constitutional: Negative for fever.  HENT: Negative for congestion, sinus pressure and ear discharge.   Eyes: Negative for discharge.  Respiratory: Positive for shortness of breath. Negative for cough.   Cardiovascular:  Negative for chest pain.  Gastrointestinal: Negative for abdominal pain and diarrhea.  Genitourinary: Negative for frequency and hematuria.  Musculoskeletal: Negative for back pain.  Skin: Negative for rash.  Neurological: Negative for seizures, light-headedness and headaches.  Hematological: Negative.   Psychiatric/Behavioral: Negative for hallucinations.    Allergies  Codeine and Sulfonamide derivatives  Home Medications   Current Outpatient Rx  Name Route Sig Dispense Refill  . AMLODIPINE BESYLATE 10 MG PO TABS Oral Take 5 mg by mouth daily.     Marland Kitchen SIMVASTATIN 20 MG PO TABS Oral Take 20 mg by mouth at bedtime.        BP 142/82  Pulse 60  Temp 98.3 F (36.8 C) (Oral)  Resp 20  Ht 5\' 3"  (1.6 m)  Wt 170 lb (77.111 kg)  BMI 30.11 kg/m2  SpO2 96%  Physical Exam  Nursing note and vitals reviewed. Constitutional: She is oriented to person, place, and time. She appears well-developed.  HENT:  Head: Normocephalic and atraumatic.  Eyes: Conjunctivae normal and EOM are normal. No scleral icterus.  Neck: Neck supple. No thyromegaly present.  Cardiovascular: Normal rate and regular rhythm.  Exam reveals no gallop and no friction rub.   No murmur heard. Pulmonary/Chest: Effort normal. No stridor. She has no wheezes. She has no rales. She exhibits no tenderness.  Abdominal: She exhibits no distension.  There is no tenderness. There is no rebound.  Musculoskeletal: Normal range of motion. She exhibits no edema.  Lymphadenopathy:    She has no cervical adenopathy.  Neurological: She is oriented to person, place, and time. Coordination normal.  Skin: No rash noted. No erythema.  Psychiatric: She has a normal mood and affect. Her behavior is normal.    ED Course  Procedures (including critical care time) DIAGNOSTIC STUDIES: Oxygen Saturation is 96% on room air, adequate by my interpretation.    COORDINATION OF CARE: 17:09- Patient and family informed of clinical course,  understand medical decision-making process, and agree with plan.     Labs Reviewed  CBC WITH DIFFERENTIAL  BASIC METABOLIC PANEL  TROPONIN I   Results for orders placed during the hospital encounter of 10/26/11  CBC WITH DIFFERENTIAL      Component Value Range   WBC 5.7  4.0 - 10.5 K/uL   RBC 4.57  3.87 - 5.11 MIL/uL   Hemoglobin 13.8  12.0 - 15.0 g/dL   HCT 41.4  36.0 - 46.0 %   MCV 90.6  78.0 - 100.0 fL   MCH 30.2  26.0 - 34.0 pg   MCHC 33.3  30.0 - 36.0 g/dL   RDW 13.1  11.5 - 15.5 %   Platelets 155  150 - 400 K/uL   Neutrophils Relative 54  43 - 77 %   Neutro Abs 3.1  1.7 - 7.7 K/uL   Lymphocytes Relative 30  12 - 46 %   Lymphs Abs 1.7  0.7 - 4.0 K/uL   Monocytes Relative 8  3 - 12 %   Monocytes Absolute 0.5  0.1 - 1.0 K/uL   Eosinophils Relative 8 (*) 0 - 5 %   Eosinophils Absolute 0.5  0.0 - 0.7 K/uL   Basophils Relative 1  0 - 1 %   Basophils Absolute 0.0  0.0 - 0.1 K/uL  BASIC METABOLIC PANEL      Component Value Range   Sodium 139  135 - 145 mEq/L   Potassium 4.1  3.5 - 5.1 mEq/L   Chloride 106  96 - 112 mEq/L   CO2 24  19 - 32 mEq/L   Glucose, Bld 87  70 - 99 mg/dL   BUN 24 (*) 6 - 23 mg/dL   Creatinine, Ser 1.74 (*) 0.50 - 1.10 mg/dL   Calcium 9.2  8.4 - 10.5 mg/dL   GFR calc non Af Amer 27 (*) >90 mL/min   GFR calc Af Amer 31 (*) >90 mL/min  TROPONIN I      Component Value Range   Troponin I <0.30  <0.30 ng/mL  PRO B NATRIURETIC PEPTIDE      Component Value Range   Pro B Natriuretic peptide (BNP) 912.2 (*) 0 - 450 pg/mL    Dg Chest Portable 1 View  10/26/2011  *RADIOLOGY REPORT*  Clinical Data: Heart fluttering.  PORTABLE CHEST - 1 VIEW  Comparison: 01/12/2010.  Findings: Sequential pacemaker is in place.  Fracture of the right atrial lead.  Radiopaque material right upper neck/thoracic inlet unchanged.  No infiltrate, congestive heart failure or pneumothorax.  Calcified tortuous aorta.  Heart size within normal limits.  The patient would eventually  benefit from follow-up two-view chest with cardiac leads removed.  IMPRESSION: Sequential pacemaker is in place.  Fracture of the right atrial lead.  No infiltrate, congestive heart failure or pneumothorax.  Calcified tortuous aorta.   Original Report Authenticated By: Doug Sou, M.D.  No diagnosis found.   Date: 10/26/2011  [id number]  Rhythm: normal sinus rhythm  QRS Axis: normal  Intervals: normal  ST/T Wave abnormalities: nonspecific ST changes  Conduction Disutrbances:none  Narrative Interpretation:   Old EKG Reviewed: none available    MDM   The chart was scribed for me under my direct supervision.  I personally performed the history, physical, and medical decision making and all procedures in the evaluation of this patient.Maudry Diego, MD 10/26/11 2016

## 2011-10-26 NOTE — ED Notes (Signed)
Pt reports woke up with SOB, heart fluttering, and nausea.  Says has had dry heaves all day.  Denies any chest pain.

## 2011-10-26 NOTE — ED Notes (Signed)
Pt's iv removed at this time . Ashley Olsen

## 2011-10-26 NOTE — Telephone Encounter (Signed)
TFC received from patient stating that she is extremely short of breath, nauseated and feels like her heart is "fluttering"  Advised her to call 911 and go to the ER for devaluation.  States that she will have her brother take her, as she is close to the hospital.

## 2011-10-26 NOTE — ED Notes (Signed)
Pt presents with heart flutter, and SOB. Pt states has 76 year old pace maker, and was told it would have to be replaced at some point in the near future. Pt denies  chest pain and weakness. Pt states was nausea and had "dry heaves" this morning with SOB.  Pt has noted irregular heart beat at this time. Pt is alert and oriented x 4. Skin pink warm and dry to touch.

## 2011-10-27 ENCOUNTER — Encounter: Payer: Self-pay | Admitting: *Deleted

## 2011-11-01 ENCOUNTER — Ambulatory Visit (INDEPENDENT_AMBULATORY_CARE_PROVIDER_SITE_OTHER): Payer: Medicare Other | Admitting: Internal Medicine

## 2011-11-01 ENCOUNTER — Encounter: Payer: Self-pay | Admitting: Internal Medicine

## 2011-11-01 VITALS — BP 134/84 | HR 70 | Ht 63.0 in | Wt 176.4 lb

## 2011-11-01 DIAGNOSIS — F17200 Nicotine dependence, unspecified, uncomplicated: Secondary | ICD-10-CM

## 2011-11-01 DIAGNOSIS — H40019 Open angle with borderline findings, low risk, unspecified eye: Secondary | ICD-10-CM | POA: Diagnosis not present

## 2011-11-01 DIAGNOSIS — I495 Sick sinus syndrome: Secondary | ICD-10-CM

## 2011-11-01 DIAGNOSIS — Z950 Presence of cardiac pacemaker: Secondary | ICD-10-CM

## 2011-11-01 DIAGNOSIS — H268 Other specified cataract: Secondary | ICD-10-CM | POA: Diagnosis not present

## 2011-11-01 DIAGNOSIS — D3130 Benign neoplasm of unspecified choroid: Secondary | ICD-10-CM | POA: Diagnosis not present

## 2011-11-01 DIAGNOSIS — H2589 Other age-related cataract: Secondary | ICD-10-CM | POA: Diagnosis not present

## 2011-11-01 LAB — PACEMAKER DEVICE OBSERVATION
BRDY-0002RV: 60 {beats}/min
DEVICE MODEL PM: 15732
RV LEAD AMPLITUDE: 5 mv
RV LEAD THRESHOLD: 2 V

## 2011-11-01 NOTE — Assessment & Plan Note (Signed)
Her symptoms are well controlled. Will continue her current medical therapy.

## 2011-11-01 NOTE — Progress Notes (Signed)
HPI [name] returns today for followup. She is a very pleasant 78 yo woman with a h/o symptomatic bradycardia, s/p PPM. She denies chest pain or sob. She has had no peripheral edema. She stopped smoking over a year ago.  She admits to gaining weight. She has had no syncope. Allergies  Allergen Reactions  . Codeine   . Sulfonamide Derivatives      Current Outpatient Prescriptions  Medication Sig Dispense Refill  . amLODipine (NORVASC) 10 MG tablet Take 5 mg by mouth daily.       . simvastatin (ZOCOR) 20 MG tablet Take 20 mg by mouth at bedtime.        Marland Kitchen DISCONTD: potassium chloride (MICRO-K) 10 MEQ CR capsule Take 10 mEq by mouth daily. As needed          Past Medical History  Diagnosis Date  . Hypertension   . Hyperlipidemia   . Pacemaker   . Sinoatrial node dysfunction   . Pain in joint, shoulder region   . Blood in urine   . History of blood clots   . Renal disorder     ROS:   All systems reviewed and negative except as noted in the HPI.   Past Surgical History  Procedure Date  . Thyroid surgery   . Diverticulitis   . Kidney stones   . Pcm   . Foot surgery      Family History  Problem Relation Age of Onset  . Cancer       History   Social History  . Marital Status: Divorced    Spouse Name: N/A    Number of Children: N/A  . Years of Education: N/A   Occupational History  . Retired    Social History Main Topics  . Smoking status: Former Smoker -- 1.0 packs/day  . Smokeless tobacco: Not on file   Comment: quit 2012  . Alcohol Use: No  . Drug Use: No  . Sexually Active: Yes    Birth Control/ Protection: None   Other Topics Concern  . Not on file   Social History Narrative  . No narrative on file     BP 134/84  Pulse 70  Ht 5\' 3"  (1.6 m)  Wt 176 lb 6.4 oz (80.015 kg)  BMI 31.25 kg/m2  SpO2 94%  Physical Exam:  Well appearing 78 yo woman, NAD HEENT: Unremarkable Neck:  No JVD, no thyromegally Lungs:  Clear with no wheezes,  rales, or rhonchi HEART:  Regular rate rhythm, no murmurs, no rubs, no clicks Abd:  soft, positive bowel sounds, no organomegally, no rebound, no guarding Ext:  2 plus pulses, no edema, no cyanosis, no clubbing Skin:  No rashes no nodules Neuro:  CN II through XII intact, motor grossly intact  DEVICE  Normal device function.  See PaceArt for details.   Assess/Plan:

## 2011-11-01 NOTE — Assessment & Plan Note (Signed)
She has been smoke free for one year according to the patient.

## 2011-11-01 NOTE — Patient Instructions (Signed)
Your physician recommends that you schedule a follow-up appointment in: 1 year  

## 2011-11-01 NOTE — Assessment & Plan Note (Signed)
Her PPM is working normally and has approximately 2 years of batter left.

## 2011-11-08 ENCOUNTER — Encounter (HOSPITAL_COMMUNITY): Payer: Self-pay

## 2011-11-11 MED ORDER — BUPIVACAINE HCL (PF) 0.5 % IJ SOLN
INTRAMUSCULAR | Status: AC
  Filled 2011-11-11: qty 30

## 2011-11-12 NOTE — Patient Instructions (Signed)
[place]  11/12/2011   Your procedure is scheduled on: 11/22/11  Report to [facility] at 0930 AM.  Call this number if you have problems the morning of surgery: [phone]   Remember:   Do not eat food:After Midnight.  May have clear liquids:until Midnight .  Clear liquids include soda, tea, black coffee, apple or grape juice, broth.  Take these medicines the morning of surgery with A SIP OF WATER: norvasc   Do not wear jewelry, make-up or nail polish.  Do not wear lotions, powders, or perfumes. You may wear deodorant.  Do not shave 48 hours prior to surgery. Men may shave face and neck.  Do not bring valuables to the hospital.  Contacts, dentures or bridgework may not be worn into surgery.  Leave suitcase in the car. After surgery it may be brought to your room.  For patients admitted to the hospital, checkout time is 11:00 AM the day of discharge.   Patients discharged the day of surgery will not be allowed to drive home.  Name and phone number of your driver: family  Special Instructions: N/A   Please read over the following fact sheets that you were given: Anesthesia Post-op Instructions and Care and Recovery After Surgery   PATIENT INSTRUCTIONS POST-ANESTHESIA  IMMEDIATELY FOLLOWING SURGERY:  Do not drive or operate machinery for the first twenty four hours after surgery.  Do not make any important decisions for twenty four hours after surgery or while taking narcotic pain medications or sedatives.  If you develop intractable nausea and vomiting or a severe headache please notify your doctor immediately.  FOLLOW-UP:  Please make an appointment with your surgeon as instructed. You do not need to follow up with anesthesia unless specifically instructed to do so.  WOUND CARE INSTRUCTIONS (if applicable):  Keep a dry clean dressing on the anesthesia/puncture wound site if there is drainage.  Once the wound has quit draining you may leave it open to air.  Generally you  should leave the bandage intact for twenty four hours unless there is drainage.  If the epidural site drains for more than 36-48 hours please call the anesthesia department.  QUESTIONS?:  Please feel free to call your physician or the hospital operator if you have any questions, and they will be happy to assist you.      Cataract Surgery  A cataract is a clouding of the lens of the eye. When a lens becomes cloudy, vision is reduced based on the degree and nature of the clouding. Surgery may be needed to improve vision. Surgery removes the cloudy lens and usually replaces it with a substitute lens (intraocular lens, IOL). LET YOUR EYE DOCTOR KNOW ABOUT:  Allergies to food or medicine.  Medicines taken including herbs, eyedrops, over-the-counter medicines, and creams.  Use of steroids (by mouth or creams).  Previous problems with anesthetics or numbing medicine.  History of bleeding problems or blood clots.  Previous surgery.  Other health problems, including diabetes and kidney problems.  Possibility of pregnancy, if this applies. RISKS AND COMPLICATIONS  Infection.  Inflammation of the eyeball (endophthalmitis) that can spread to both eyes (sympathetic ophthalmia).  Poor wound healing.  If an IOL is inserted, it can later fall out of proper position. This is very uncommon.  Clouding of the part of your eye that holds an IOL in place. This is called an "after-cataract." These are uncommon, but easily treated. BEFORE THE PROCEDURE  Do not eat or drink anything  except small amounts of water for 8 to 12 before your surgery, or as directed by your caregiver.  Unless you are told otherwise, continue any eyedrops you have been prescribed.  Talk to your primary caregiver about all other medicines that you take (both prescription and non-prescription). In some cases, you may need to stop or change medicines near the time of your surgery. This is most important if you are taking  blood-thinning medicine.Do not stop medicines unless you are told to do so.  Arrange for someone to drive you to and from the procedure.  Do not put contact lenses in either eye on the day of your surgery. PROCEDURE There is more than one method for safely removing a cataract. Your doctor can explain the differences and help determine which is best for you. Phacoemulsification surgery is the most common form of cataract surgery.  An injection is given behind the eye or eyedrops are given to make this a painless procedure.  A small cut (incision) is made on the edge of the clear, dome-shaped surface that covers the front of the eye (cornea).  A tiny probe is painlessly inserted into the eye. This device gives off ultrasound waves that soften and break up the cloudy center of the lens. This makes it easier for the cloudy lens to be removed by suction.  An IOL may be implanted.  The normal lens of the eye is covered by a clear capsule. Part of that capsule is intentionally left in the eye to support the IOL.  Your surgeon may or may not use stitches to close the incision. There are other forms of cataract surgery that require a larger incision and stiches to close the eye. This approach is taken in cases where the doctor feels that the cataract cannot be easily removed using phacoemulsification. AFTER THE PROCEDURE  When an IOL is implanted, it does not need care. It becomes a permanent part of your eye and cannot be seen or felt.  Your doctor will schedule follow-up exams to check on your progress.  Review your other medicines with your doctor to see which can be resumed after surgery.  Use eyedrops or take medicine as prescribed by your doctor. Document Released: 12/17/2010 Document Revised: 03/22/2011 Document Reviewed: 12/17/2010 [facility] Patient Information 2013 [place].

## 2011-11-15 ENCOUNTER — Encounter (HOSPITAL_COMMUNITY): Payer: Self-pay

## 2011-11-15 ENCOUNTER — Encounter (HOSPITAL_COMMUNITY)
Admission: RE | Admit: 2011-11-15 | Discharge: 2011-11-15 | Disposition: A | Discharge status: Home / Self Care | Payer: Medicare Other | Source: Ambulatory Visit | Attending: Ophthalmology | Admitting: Ophthalmology

## 2011-11-15 HISTORY — DX: Age-related osteoporosis without current pathological fracture: M81.0

## 2011-11-15 HISTORY — DX: Personal history of other diseases of the digestive system: Z87.19

## 2011-11-15 HISTORY — DX: Unspecified osteoarthritis, unspecified site: M19.90

## 2011-11-22 ENCOUNTER — Encounter (HOSPITAL_COMMUNITY): Payer: Self-pay | Admitting: *Deleted

## 2011-11-22 ENCOUNTER — Encounter (HOSPITAL_COMMUNITY): Payer: Self-pay | Admitting: Anesthesiology

## 2011-11-22 ENCOUNTER — Encounter (HOSPITAL_COMMUNITY)
Admission: RE | Disposition: A | Discharge status: Home / Self Care | Payer: Self-pay | Source: Ambulatory Visit | Attending: Ophthalmology

## 2011-11-22 ENCOUNTER — Ambulatory Visit (HOSPITAL_COMMUNITY)
Admission: RE | Admit: 2011-11-22 | Discharge: 2011-11-22 | Disposition: A | Discharge status: Home / Self Care | Payer: Medicare Other | Source: Ambulatory Visit | Attending: Ophthalmology | Admitting: Ophthalmology

## 2011-11-22 ENCOUNTER — Ambulatory Visit (HOSPITAL_COMMUNITY): Payer: Medicare Other | Admitting: Anesthesiology

## 2011-11-22 ENCOUNTER — Encounter (HOSPITAL_COMMUNITY): Payer: Self-pay | Admitting: Ophthalmology

## 2011-11-22 DIAGNOSIS — H2510 Age-related nuclear cataract, unspecified eye: Secondary | ICD-10-CM | POA: Insufficient documentation

## 2011-11-22 DIAGNOSIS — H268 Other specified cataract: Secondary | ICD-10-CM | POA: Diagnosis not present

## 2011-11-22 DIAGNOSIS — I10 Essential (primary) hypertension: Secondary | ICD-10-CM | POA: Insufficient documentation

## 2011-11-22 DIAGNOSIS — H269 Unspecified cataract: Secondary | ICD-10-CM | POA: Diagnosis not present

## 2011-11-22 DIAGNOSIS — H2589 Other age-related cataract: Secondary | ICD-10-CM | POA: Insufficient documentation

## 2011-11-22 HISTORY — PX: CATARACT EXTRACTION W/PHACO: SHX586

## 2011-11-22 SURGERY — PHACOEMULSIFICATION, CATARACT, WITH IOL INSERTION
Anesthesia: Monitor Anesthesia Care | Site: Eye | Laterality: Left | Wound class: Clean

## 2011-11-22 MED ORDER — MIDAZOLAM HCL 2 MG/2ML IJ SOLN
INTRAMUSCULAR | Status: AC
  Filled 2011-11-22: qty 2

## 2011-11-22 MED ORDER — EPINEPHRINE HCL 1 MG/ML IJ SOLN
INTRAMUSCULAR | Status: AC
  Filled 2011-11-22: qty 1

## 2011-11-22 MED ORDER — NEOMYCIN-POLYMYXIN-DEXAMETH 0.1 % OP OINT
TOPICAL_OINTMENT | OPHTHALMIC | Status: DC | PRN
  Administered 2011-11-22: 1 via OPHTHALMIC

## 2011-11-22 MED ORDER — PHENYLEPHRINE HCL 2.5 % OP SOLN
OPHTHALMIC | Status: AC
  Filled 2011-11-22: qty 2

## 2011-11-22 MED ORDER — LIDOCAINE HCL 3.5 % OP GEL
OPHTHALMIC | Status: AC
  Filled 2011-11-22: qty 5

## 2011-11-22 MED ORDER — TETRACAINE HCL 0.5 % OP SOLN
1.0000 [drp] | OPHTHALMIC | Status: AC
  Administered 2011-11-22 (×3): 1 [drp] via OPHTHALMIC

## 2011-11-22 MED ORDER — MIDAZOLAM HCL 2 MG/2ML IJ SOLN
1.0000 mg | INTRAMUSCULAR | Status: DC | PRN
  Administered 2011-11-22 (×2): 1 mg via INTRAVENOUS

## 2011-11-22 MED ORDER — CYCLOPENTOLATE-PHENYLEPHRINE 0.2-1 % OP SOLN
1.0000 [drp] | OPHTHALMIC | Status: AC
  Administered 2011-11-22 (×3): 1 [drp] via OPHTHALMIC

## 2011-11-22 MED ORDER — POVIDONE-IODINE 5 % OP SOLN
OPHTHALMIC | Status: DC | PRN
  Administered 2011-11-22: 1 via OPHTHALMIC

## 2011-11-22 MED ORDER — LIDOCAINE HCL (PF) 1 % IJ SOLN
INTRAMUSCULAR | Status: AC
  Filled 2011-11-22: qty 2

## 2011-11-22 MED ORDER — PROVISC 10 MG/ML IO SOLN
INTRAOCULAR | Status: DC | PRN
  Administered 2011-11-22: 8.5 mg via INTRAOCULAR

## 2011-11-22 MED ORDER — LACTATED RINGERS IV SOLN
INTRAVENOUS | Status: DC
  Administered 2011-11-22: 11:00:00 via INTRAVENOUS

## 2011-11-22 MED ORDER — NEOMYCIN-POLYMYXIN-DEXAMETH 3.5-10000-0.1 OP OINT
TOPICAL_OINTMENT | OPHTHALMIC | Status: AC
  Filled 2011-11-22: qty 3.5

## 2011-11-22 MED ORDER — LIDOCAINE HCL (PF) 1 % IJ SOLN
INTRAOCULAR | Status: DC | PRN
  Administered 2011-11-22: 12:00:00 via OPHTHALMIC

## 2011-11-22 MED ORDER — LIDOCAINE 3.5 % OP GEL OPTIME - NO CHARGE
OPHTHALMIC | Status: DC | PRN
  Administered 2011-11-22: 2 [drp] via OPHTHALMIC

## 2011-11-22 MED ORDER — PHENYLEPHRINE HCL 2.5 % OP SOLN
1.0000 [drp] | OPHTHALMIC | Status: AC
  Administered 2011-11-22 (×3): 1 [drp] via OPHTHALMIC

## 2011-11-22 MED ORDER — EPINEPHRINE HCL 1 MG/ML IJ SOLN
INTRAOCULAR | Status: DC | PRN
  Administered 2011-11-22: 12:00:00

## 2011-11-22 MED ORDER — TETRACAINE HCL 0.5 % OP SOLN
OPHTHALMIC | Status: AC
  Filled 2011-11-22: qty 2

## 2011-11-22 MED ORDER — BSS IO SOLN
INTRAOCULAR | Status: DC | PRN
  Administered 2011-11-22: 15 mL via INTRAOCULAR

## 2011-11-22 MED ORDER — LIDOCAINE HCL 3.5 % OP GEL
1.0000 "application " | Freq: Once | OPHTHALMIC | Status: AC
  Administered 2011-11-22: 1 via OPHTHALMIC

## 2011-11-22 MED ORDER — CYCLOPENTOLATE-PHENYLEPHRINE 0.2-1 % OP SOLN
OPHTHALMIC | Status: AC
  Filled 2011-11-22: qty 2

## 2011-11-22 MED ORDER — LIDOCAINE HCL (PF) 1 % IJ SOLN: INTRAMUSCULAR | Status: DC | PRN

## 2011-11-22 SURGICAL SUPPLY — 32 items
CAPSULAR TENSION RING-AMO (OPHTHALMIC RELATED) IMPLANT
CLOTH BEACON ORANGE TIMEOUT ST (SAFETY) ×1 IMPLANT
EYE SHIELD UNIVERSAL CLEAR (GAUZE/BANDAGES/DRESSINGS) ×1 IMPLANT
GLOVE BIO SURGEON STRL SZ 6.5 (GLOVE) IMPLANT
GLOVE BIOGEL PI IND STRL 6.5 (GLOVE) IMPLANT
GLOVE BIOGEL PI IND STRL 7.0 (GLOVE) IMPLANT
GLOVE BIOGEL PI IND STRL 7.5 (GLOVE) IMPLANT
GLOVE BIOGEL PI INDICATOR 6.5 (GLOVE) ×1
GLOVE BIOGEL PI INDICATOR 7.0 (GLOVE)
GLOVE BIOGEL PI INDICATOR 7.5 (GLOVE)
GLOVE ECLIPSE 6.5 STRL STRAW (GLOVE) IMPLANT
GLOVE ECLIPSE 7.0 STRL STRAW (GLOVE) IMPLANT
GLOVE ECLIPSE 7.5 STRL STRAW (GLOVE) IMPLANT
GLOVE EXAM NITRILE LRG STRL (GLOVE) IMPLANT
GLOVE EXAM NITRILE MD LF STRL (GLOVE) ×1 IMPLANT
GLOVE SKINSENSE NS SZ6.5 (GLOVE)
GLOVE SKINSENSE NS SZ7.0 (GLOVE)
GLOVE SKINSENSE STRL SZ6.5 (GLOVE) IMPLANT
GLOVE SKINSENSE STRL SZ7.0 (GLOVE) IMPLANT
KIT VITRECTOMY (OPHTHALMIC RELATED) IMPLANT
PAD ARMBOARD 7.5X6 YLW CONV (MISCELLANEOUS) ×1 IMPLANT
PROC W NO LENS (INTRAOCULAR LENS)
PROC W SPEC LENS (INTRAOCULAR LENS)
PROCESS W NO LENS (INTRAOCULAR LENS) IMPLANT
PROCESS W SPEC LENS (INTRAOCULAR LENS) IMPLANT
RING MALYGIN (MISCELLANEOUS) ×1 IMPLANT
SIGHTPATH CAT PROC W REG LENS (Ophthalmic Related) ×2 IMPLANT
SYR TB 1ML LL NO SAFETY (SYRINGE) ×1 IMPLANT
TAPE SURG TRANSPORE 1 IN (GAUZE/BANDAGES/DRESSINGS) IMPLANT
TAPE SURGICAL TRANSPORE 1 IN (GAUZE/BANDAGES/DRESSINGS) ×1
VISCOELASTIC ADDITIONAL (OPHTHALMIC RELATED) IMPLANT
WATER STERILE IRR 250ML POUR (IV SOLUTION) ×1 IMPLANT

## 2011-11-22 NOTE — H&P (Signed)
I have reviewed the H&P, the patient was re-examined, and I have identified no interval changes in medical condition and plan of care since the history and physical of record  

## 2011-11-22 NOTE — Transfer of Care (Signed)
Immediate Anesthesia Transfer of Care Note  Patient: Ashley Olsen  Procedure(s) Performed: Procedure(s) (LRB) with comments: CATARACT EXTRACTION PHACO AND INTRAOCULAR LENS PLACEMENT (IOC) (Left) - CDE:  12.85  Patient Location: PACU and Short Stay  Anesthesia Type:MAC  Level of Consciousness: awake  Airway & Oxygen Therapy: Patient Spontanous Breathing  Post-op Assessment: Report given to PACU RN  Post vital signs: Reviewed  Complications: No apparent anesthesia complications

## 2011-11-22 NOTE — Anesthesia Postprocedure Evaluation (Signed)
  Anesthesia Post-op Note  Patient: Ashley Olsen  Procedure(s) Performed: Procedure(s) (LRB) with comments: CATARACT EXTRACTION PHACO AND INTRAOCULAR LENS PLACEMENT (IOC) (Left) - CDE:  12.85  Patient Location: PACU and Short Stay  Anesthesia Type:MAC  Level of Consciousness: awake  Airway and Oxygen Therapy: Patient Spontanous Breathing  Post-op Pain: none  Post-op Assessment: Post-op Vital signs reviewed, Patient's Cardiovascular Status Stable, Respiratory Function Stable, Patent Airway and No signs of Nausea or vomiting  Post-op Vital Signs: Reviewed and stable  Complications: No apparent anesthesia complications

## 2011-11-22 NOTE — Anesthesia Preprocedure Evaluation (Addendum)
Anesthesia Evaluation  Patient identified by MRN, date of birth, ID band Patient awake    Reviewed: Allergy & Precautions, H&P , NPO status , Patient's Chart, lab work & pertinent test results  Airway Mallampati: II      Dental  (+) Edentulous Upper   Pulmonary neg pulmonary ROS,    Pulmonary exam normal       Cardiovascular hypertension, Pt. on medications + dysrhythmias + pacemaker Rhythm:Regular Rate:Normal     Neuro/Psych    GI/Hepatic hiatal hernia,   Endo/Other    Renal/GU Renal disease     Musculoskeletal   Abdominal   Peds  Hematology   Anesthesia Other Findings   Reproductive/Obstetrics                          Anesthesia Physical Anesthesia Plan  ASA: III  Anesthesia Plan: MAC   Post-op Pain Management:    Induction: Intravenous  Airway Management Planned: Nasal Cannula  Additional Equipment:   Intra-op Plan:   Post-operative Plan:   Informed Consent: I have reviewed the patients History and Physical, chart, labs and discussed the procedure including the risks, benefits and alternatives for the proposed anesthesia with the patient or authorized representative who has indicated his/her understanding and acceptance.     Plan Discussed with:   Anesthesia Plan Comments:         Anesthesia Quick Evaluation

## 2011-11-22 NOTE — Brief Op Note (Signed)
Pre-Op Dx: Cataract OS Post-Op Dx: Cataract OS Surgeon: Addie Alonge Anesthesia: Topical with MAC Surgery: Cataract Extraction with Intraocular lens Implant OS Implant: B&L enVista Specimen: None Complications: None 

## 2011-11-23 NOTE — Op Note (Signed)
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  SURGEON:  Richardo Hanks, MD  ANESTHESIA:  Topical with monitored anesthesia care and IV sedation.  SURGERY PERFORMED:  Phacoemulsification posterior chamber intraocular lens implantation, left eye.  DESCRIPTION OF OPERATION:  In the preoperative holding area, dilating drops and Viscous lidocaine were placed into the left eye.  The patient was then brought to the operating room where she was prepped and draped. Beginning with a 75 blade, a paracentesis port was made at the surgeon's 2o'clock position.  Anterior chamber was then filled with 1% nonpreserved lidocaine solution with epinephrine.  This failed to dilate the pupil beyond 4 mm.  The decision was made to use a Malyugin ring. Anterior chamber then filled with Provisc and a 2.4 mm keratome blade was then used to make a clear corneal incision at the temporal limbus. Decision was made to use a Malyugin ring.  So at this stage the Malyugin ring was placed into the anterior chamber and the iris margins engaged in the loops of the ring.  After proper placement, it was noted that there was a anterior capsular tear, so decision was made to convert to a postage stamp capsulotomy using the bent cystotome needle. Hydrodissection was performed with balanced salt solution on a fine cannula.  The lens nucleus was then removed using phacoemulsification and quadrant  cracking technique.  Residual cortex was removed with irrigation and aspiration handpiece.  Capsular bag and anterior chamber were refilled with Provisc and a posterior chamber intraocular lens was placed into the capsular bag without difficulty using lens injection system.  The Malyugin ring was then disengaged from the iris margins and removed with its injector.  The Provisc was then removed from the capsular bag and anterior chamber using the irrigation and aspiration handpiece.  The implant was very well centered.  Stromal hydration of the main incision and paracentesis ports were performed with balanced salt solution on a fine cannula.  The wounds were tested for leak which was negative.  The patient tolerated the procedure well.  There were no operative complications and she was returned to the recovery area in satisfactory condition.  No surgical specimens.  Prosthetic device used is a [employer], model EnVista, model number MX60, power of 21.0, serial number is [id number].          ______________________________ Richardo Hanks, MD     KEH/MEDQ  D:  11/22/2011  T:  11/23/2011  Job:  [id number]

## 2011-11-24 ENCOUNTER — Encounter (HOSPITAL_COMMUNITY): Payer: Self-pay | Admitting: Ophthalmology

## 2011-12-02 ENCOUNTER — Encounter: Payer: Medicaid Other | Admitting: Internal Medicine

## 2011-12-13 ENCOUNTER — Encounter: Payer: Medicaid Other | Admitting: Internal Medicine

## 2012-01-15 DIAGNOSIS — Z23 Encounter for immunization: Secondary | ICD-10-CM | POA: Diagnosis not present

## 2012-02-02 DIAGNOSIS — N2581 Secondary hyperparathyroidism of renal origin: Secondary | ICD-10-CM | POA: Diagnosis not present

## 2012-02-02 DIAGNOSIS — N183 Chronic kidney disease, stage 3 unspecified: Secondary | ICD-10-CM | POA: Diagnosis not present

## 2012-02-16 DIAGNOSIS — I129 Hypertensive chronic kidney disease with stage 1 through stage 4 chronic kidney disease, or unspecified chronic kidney disease: Secondary | ICD-10-CM | POA: Diagnosis not present

## 2012-02-29 DIAGNOSIS — I10 Essential (primary) hypertension: Secondary | ICD-10-CM | POA: Diagnosis not present

## 2012-02-29 DIAGNOSIS — E785 Hyperlipidemia, unspecified: Secondary | ICD-10-CM | POA: Diagnosis not present

## 2012-03-08 ENCOUNTER — Encounter: Payer: Self-pay | Admitting: Cardiology

## 2012-03-31 DIAGNOSIS — N3944 Nocturnal enuresis: Secondary | ICD-10-CM | POA: Diagnosis not present

## 2012-03-31 DIAGNOSIS — N3946 Mixed incontinence: Secondary | ICD-10-CM | POA: Diagnosis not present

## 2012-05-01 ENCOUNTER — Ambulatory Visit (INDEPENDENT_AMBULATORY_CARE_PROVIDER_SITE_OTHER): Payer: Medicare Other | Admitting: *Deleted

## 2012-05-01 ENCOUNTER — Encounter: Payer: Self-pay | Admitting: *Deleted

## 2012-05-01 ENCOUNTER — Other Ambulatory Visit: Payer: Self-pay | Admitting: Internal Medicine

## 2012-05-01 DIAGNOSIS — I495 Sick sinus syndrome: Secondary | ICD-10-CM

## 2012-05-01 LAB — PACEMAKER DEVICE OBSERVATION
BATTERY VOLTAGE: 2.71 V
RV LEAD IMPEDENCE PM: 523 Ohm
RV LEAD THRESHOLD: 2 V

## 2012-05-01 NOTE — Progress Notes (Signed)
PPM CHECK

## 2012-05-12 ENCOUNTER — Encounter: Payer: Self-pay | Admitting: Internal Medicine

## 2012-05-16 DIAGNOSIS — N3944 Nocturnal enuresis: Secondary | ICD-10-CM | POA: Diagnosis not present

## 2012-05-16 DIAGNOSIS — N3946 Mixed incontinence: Secondary | ICD-10-CM | POA: Diagnosis not present

## 2012-05-19 DIAGNOSIS — E210 Primary hyperparathyroidism: Secondary | ICD-10-CM | POA: Diagnosis not present

## 2012-05-19 DIAGNOSIS — I129 Hypertensive chronic kidney disease with stage 1 through stage 4 chronic kidney disease, or unspecified chronic kidney disease: Secondary | ICD-10-CM | POA: Diagnosis not present

## 2012-05-22 DIAGNOSIS — N3946 Mixed incontinence: Secondary | ICD-10-CM | POA: Diagnosis not present

## 2012-05-22 DIAGNOSIS — N3944 Nocturnal enuresis: Secondary | ICD-10-CM | POA: Diagnosis not present

## 2012-07-11 DIAGNOSIS — N3946 Mixed incontinence: Secondary | ICD-10-CM | POA: Diagnosis not present

## 2012-07-11 DIAGNOSIS — N3944 Nocturnal enuresis: Secondary | ICD-10-CM | POA: Diagnosis not present

## 2012-08-22 DIAGNOSIS — L989 Disorder of the skin and subcutaneous tissue, unspecified: Secondary | ICD-10-CM | POA: Diagnosis not present

## 2012-08-22 DIAGNOSIS — D2360 Other benign neoplasm of skin of unspecified upper limb, including shoulder: Secondary | ICD-10-CM | POA: Diagnosis not present

## 2012-08-22 DIAGNOSIS — L821 Other seborrheic keratosis: Secondary | ICD-10-CM | POA: Diagnosis not present

## 2012-09-05 DIAGNOSIS — D2360 Other benign neoplasm of skin of unspecified upper limb, including shoulder: Secondary | ICD-10-CM | POA: Diagnosis not present

## 2012-09-13 DIAGNOSIS — Z6832 Body mass index (BMI) 32.0-32.9, adult: Secondary | ICD-10-CM | POA: Diagnosis not present

## 2012-09-13 DIAGNOSIS — L259 Unspecified contact dermatitis, unspecified cause: Secondary | ICD-10-CM | POA: Diagnosis not present

## 2012-10-05 DIAGNOSIS — H5200 Hypermetropia, unspecified eye: Secondary | ICD-10-CM | POA: Diagnosis not present

## 2012-10-05 DIAGNOSIS — H52229 Regular astigmatism, unspecified eye: Secondary | ICD-10-CM | POA: Diagnosis not present

## 2012-10-05 DIAGNOSIS — D3130 Benign neoplasm of unspecified choroid: Secondary | ICD-10-CM | POA: Diagnosis not present

## 2012-10-05 DIAGNOSIS — H524 Presbyopia: Secondary | ICD-10-CM | POA: Diagnosis not present

## 2012-11-08 ENCOUNTER — Ambulatory Visit (INDEPENDENT_AMBULATORY_CARE_PROVIDER_SITE_OTHER): Payer: Medicare Other | Admitting: Internal Medicine

## 2012-11-08 ENCOUNTER — Encounter: Payer: Self-pay | Admitting: Internal Medicine

## 2012-11-08 VITALS — BP 129/82 | HR 65 | Ht 63.0 in | Wt 173.0 lb

## 2012-11-08 DIAGNOSIS — Z950 Presence of cardiac pacemaker: Secondary | ICD-10-CM

## 2012-11-08 DIAGNOSIS — I10 Essential (primary) hypertension: Secondary | ICD-10-CM

## 2012-11-08 DIAGNOSIS — I495 Sick sinus syndrome: Secondary | ICD-10-CM

## 2012-11-08 LAB — PACEMAKER DEVICE OBSERVATION
BRDY-0002RV: 60 {beats}/min
RV LEAD AMPLITUDE: 4 mv
RV LEAD THRESHOLD: 2 V

## 2012-11-08 NOTE — Patient Instructions (Signed)
Your physician recommends that you schedule a follow-up appointment in: [place]

## 2012-11-08 NOTE — Assessment & Plan Note (Signed)
Interrogation of the patient's [employer] Jude single chamber pacemaker demonstrates normal pacemaker function, and over one year of estimated battery longevity. We'll plan to recheck in several months.

## 2012-11-08 NOTE — Assessment & Plan Note (Signed)
Her blood pressure is well controlled today. Her weight remains elevated. She is working on trying to lose weight. No change in blood pressure medications.
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
## 2012-11-13 DIAGNOSIS — I10 Essential (primary) hypertension: Secondary | ICD-10-CM | POA: Diagnosis not present

## 2012-11-27 DIAGNOSIS — Z23 Encounter for immunization: Secondary | ICD-10-CM | POA: Diagnosis not present

## 2013-03-10 ENCOUNTER — Emergency Department (HOSPITAL_COMMUNITY)
Admission: EM | Admit: 2013-03-10 | Discharge: 2013-03-10 | Disposition: A | Discharge status: Home / Self Care | Payer: Medicare Other | Attending: Emergency Medicine | Admitting: Emergency Medicine

## 2013-03-10 ENCOUNTER — Encounter (HOSPITAL_COMMUNITY): Payer: Self-pay | Admitting: Emergency Medicine

## 2013-03-10 ENCOUNTER — Emergency Department (HOSPITAL_COMMUNITY): Payer: Medicare Other

## 2013-03-10 DIAGNOSIS — Z950 Presence of cardiac pacemaker: Secondary | ICD-10-CM | POA: Diagnosis not present

## 2013-03-10 DIAGNOSIS — S20219A Contusion of unspecified front wall of thorax, initial encounter: Secondary | ICD-10-CM | POA: Diagnosis not present

## 2013-03-10 DIAGNOSIS — Z8719 Personal history of other diseases of the digestive system: Secondary | ICD-10-CM | POA: Diagnosis not present

## 2013-03-10 DIAGNOSIS — E785 Hyperlipidemia, unspecified: Secondary | ICD-10-CM | POA: Diagnosis not present

## 2013-03-10 DIAGNOSIS — S298XXA Other specified injuries of thorax, initial encounter: Secondary | ICD-10-CM | POA: Diagnosis not present

## 2013-03-10 DIAGNOSIS — I10 Essential (primary) hypertension: Secondary | ICD-10-CM | POA: Insufficient documentation

## 2013-03-10 DIAGNOSIS — Z8739 Personal history of other diseases of the musculoskeletal system and connective tissue: Secondary | ICD-10-CM | POA: Insufficient documentation

## 2013-03-10 DIAGNOSIS — Z79899 Other long term (current) drug therapy: Secondary | ICD-10-CM | POA: Insufficient documentation

## 2013-03-10 DIAGNOSIS — Y929 Unspecified place or not applicable: Secondary | ICD-10-CM | POA: Insufficient documentation

## 2013-03-10 DIAGNOSIS — W1809XA Striking against other object with subsequent fall, initial encounter: Secondary | ICD-10-CM | POA: Insufficient documentation

## 2013-03-10 DIAGNOSIS — Z87448 Personal history of other diseases of urinary system: Secondary | ICD-10-CM | POA: Insufficient documentation

## 2013-03-10 DIAGNOSIS — Y939 Activity, unspecified: Secondary | ICD-10-CM | POA: Insufficient documentation

## 2013-03-10 DIAGNOSIS — Z87891 Personal history of nicotine dependence: Secondary | ICD-10-CM | POA: Insufficient documentation

## 2013-03-10 DIAGNOSIS — R079 Chest pain, unspecified: Secondary | ICD-10-CM | POA: Diagnosis not present

## 2013-03-10 MED ORDER — HYDROCODONE-ACETAMINOPHEN 5-325 MG PO TABS
1.0000 | ORAL_TABLET | Freq: Once | ORAL | Status: AC
  Administered 2013-03-10: 1 via ORAL

## 2013-03-10 MED ORDER — HYDROCODONE-ACETAMINOPHEN 5-325 MG PO TABS
ORAL_TABLET | ORAL | Status: AC
  Filled 2013-03-10: qty 1

## 2013-03-10 MED ORDER — HYDROCODONE-ACETAMINOPHEN 5-325 MG PO TABS: 1.0000 | ORAL_TABLET | Freq: Four times a day (QID) | ORAL | Status: DC | PRN

## 2013-03-10 NOTE — Discharge Instructions (Signed)
Follow up with your md in a week as needed

## 2013-03-10 NOTE — ED Notes (Signed)
Left sided rib pain. States she stumbled and fell against bed post night before last. Pain to area when coughing, laughing, or taking a deep breath. Pt was here last night by LWBS due to wait.
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
## 2013-03-16 DIAGNOSIS — R079 Chest pain, unspecified: Secondary | ICD-10-CM | POA: Diagnosis not present

## 2013-05-06 ENCOUNTER — Encounter (HOSPITAL_COMMUNITY): Payer: Self-pay | Admitting: Emergency Medicine

## 2013-05-06 ENCOUNTER — Emergency Department (HOSPITAL_COMMUNITY)
Admission: EM | Admit: 2013-05-06 | Discharge: 2013-05-06 | Disposition: A | Discharge status: Home / Self Care | Payer: Medicare Other | Attending: Emergency Medicine | Admitting: Emergency Medicine

## 2013-05-06 DIAGNOSIS — Z87891 Personal history of nicotine dependence: Secondary | ICD-10-CM | POA: Diagnosis not present

## 2013-05-06 DIAGNOSIS — Z8719 Personal history of other diseases of the digestive system: Secondary | ICD-10-CM | POA: Diagnosis not present

## 2013-05-06 DIAGNOSIS — Z79899 Other long term (current) drug therapy: Secondary | ICD-10-CM | POA: Insufficient documentation

## 2013-05-06 DIAGNOSIS — N390 Urinary tract infection, site not specified: Secondary | ICD-10-CM | POA: Diagnosis not present

## 2013-05-06 DIAGNOSIS — I10 Essential (primary) hypertension: Secondary | ICD-10-CM | POA: Insufficient documentation

## 2013-05-06 DIAGNOSIS — M546 Pain in thoracic spine: Secondary | ICD-10-CM | POA: Insufficient documentation

## 2013-05-06 DIAGNOSIS — Z950 Presence of cardiac pacemaker: Secondary | ICD-10-CM | POA: Diagnosis not present

## 2013-05-06 DIAGNOSIS — Z87448 Personal history of other diseases of urinary system: Secondary | ICD-10-CM | POA: Diagnosis not present

## 2013-05-06 DIAGNOSIS — Z8739 Personal history of other diseases of the musculoskeletal system and connective tissue: Secondary | ICD-10-CM | POA: Insufficient documentation

## 2013-05-06 DIAGNOSIS — E785 Hyperlipidemia, unspecified: Secondary | ICD-10-CM | POA: Insufficient documentation

## 2013-05-06 LAB — URINE MICROSCOPIC-ADD ON

## 2013-05-06 LAB — URINALYSIS, ROUTINE W REFLEX MICROSCOPIC
Bilirubin Urine: NEGATIVE
GLUCOSE, UA: NEGATIVE mg/dL
Ketones, ur: NEGATIVE mg/dL
Nitrite: NEGATIVE
PH: 5.5 (ref 5.0–8.0)
PROTEIN: 100 mg/dL — AB
SPECIFIC GRAVITY, URINE: 1.025 (ref 1.005–1.030)
Urobilinogen, UA: 0.2 mg/dL (ref 0.0–1.0)

## 2013-05-06 LAB — CBG MONITORING, ED: GLUCOSE-CAPILLARY: 91 mg/dL (ref 70–99)

## 2013-05-06 MED ORDER — CIPROFLOXACIN HCL 500 MG PO TABS: 500.0000 mg | ORAL_TABLET | Freq: Two times a day (BID) | ORAL | Status: DC

## 2013-05-06 MED ORDER — CIPROFLOXACIN HCL 250 MG PO TABS
500.0000 mg | ORAL_TABLET | Freq: Once | ORAL | Status: AC
  Administered 2013-05-06: 500 mg via ORAL
  Filled 2013-05-06: qty 2

## 2013-05-06 NOTE — ED Notes (Signed)
POC CBG 91 in triage.

## 2013-05-06 NOTE — ED Notes (Signed)
Pt reports increased urinary frequency, dysuria for several days. Pt reports "I only pee a little bit when I make water and it burns when I pee." Pt denies any n/v/d,abdominal pain.
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
## 2013-05-06 NOTE — Discharge Instructions (Signed)
Keep your appointment for follow up with your doctor. Return here for worsening symptoms. You may continue the take the AZO

## 2013-05-07 NOTE — ED Provider Notes (Signed)
Medical screening examination/treatment/procedure(s) were conducted as a shared visit with non-physician practitioner(s) and myself.  I personally evaluated the patient during the encounter.   EKG Interpretation None     Patient is nontoxic. Well-hydrated. Urinalysis shows too numerous to count white cells. Will treat for urinary tract infection.  Nat Christen, MD 05/07/13 1623

## 2013-05-08 LAB — URINE CULTURE: Colony Count: >=100000

## 2013-05-11 ENCOUNTER — Telehealth (HOSPITAL_BASED_OUTPATIENT_CLINIC_OR_DEPARTMENT_OTHER): Payer: Self-pay | Admitting: Emergency Medicine

## 2013-05-11 NOTE — Telephone Encounter (Signed)
Post ED Visit - Positive Culture Follow-up  Culture report reviewed by antimicrobial stewardship pharmacist: []  Wes Dulaney, Pharm.D., BCPS []  Heide Guile, Pharm.D., BCPS [x]  Alycia Rossetti, Pharm.D., BCPS []  [place], Pharm.D., BCPS, AAHIVP []  Legrand Como, Pharm.D., BCPS, AAHIVP []  Juliene Pina, Pharm.D.  Positive urine culture Treated with Cipro, organism sensitive to the same and no further patient follow-up is required at this time.  Kyshawn Teal 05/11/2013, 2:05 PM

## 2013-05-21 ENCOUNTER — Other Ambulatory Visit (HOSPITAL_COMMUNITY): Payer: Self-pay | Admitting: Family Medicine

## 2013-05-21 DIAGNOSIS — E785 Hyperlipidemia, unspecified: Secondary | ICD-10-CM

## 2013-05-21 DIAGNOSIS — R7301 Impaired fasting glucose: Secondary | ICD-10-CM

## 2013-05-21 DIAGNOSIS — M1990 Unspecified osteoarthritis, unspecified site: Secondary | ICD-10-CM

## 2013-05-21 DIAGNOSIS — I10 Essential (primary) hypertension: Secondary | ICD-10-CM | POA: Diagnosis not present

## 2013-05-21 DIAGNOSIS — Z6831 Body mass index (BMI) 31.0-31.9, adult: Secondary | ICD-10-CM | POA: Diagnosis not present

## 2013-05-22 ENCOUNTER — Other Ambulatory Visit (HOSPITAL_COMMUNITY): Payer: Self-pay | Admitting: Family Medicine

## 2013-05-22 DIAGNOSIS — M8580 Other specified disorders of bone density and structure, unspecified site: Secondary | ICD-10-CM

## 2013-05-23 ENCOUNTER — Encounter: Payer: Self-pay | Admitting: *Deleted

## 2013-05-23 DIAGNOSIS — M1990 Unspecified osteoarthritis, unspecified site: Secondary | ICD-10-CM | POA: Diagnosis not present

## 2013-05-28 ENCOUNTER — Ambulatory Visit (HOSPITAL_COMMUNITY)
Admission: RE | Admit: 2013-05-28 | Discharge: 2013-05-28 | Disposition: A | Discharge status: Home / Self Care | Payer: Medicare Other | Source: Ambulatory Visit | Attending: Family Medicine | Admitting: Family Medicine

## 2013-05-28 DIAGNOSIS — M899 Disorder of bone, unspecified: Secondary | ICD-10-CM | POA: Insufficient documentation

## 2013-05-28 DIAGNOSIS — Z780 Asymptomatic menopausal state: Secondary | ICD-10-CM | POA: Diagnosis not present

## 2013-05-28 DIAGNOSIS — M810 Age-related osteoporosis without current pathological fracture: Secondary | ICD-10-CM | POA: Diagnosis not present

## 2013-05-28 DIAGNOSIS — M8580 Other specified disorders of bone density and structure, unspecified site: Secondary | ICD-10-CM

## 2013-05-28 DIAGNOSIS — M949 Disorder of cartilage, unspecified: Principal | ICD-10-CM

## 2013-05-29 DIAGNOSIS — I10 Essential (primary) hypertension: Secondary | ICD-10-CM | POA: Diagnosis not present

## 2013-05-29 DIAGNOSIS — N183 Chronic kidney disease, stage 3 unspecified: Secondary | ICD-10-CM | POA: Diagnosis not present

## 2013-06-06 DIAGNOSIS — N183 Chronic kidney disease, stage 3 unspecified: Secondary | ICD-10-CM | POA: Diagnosis not present

## 2013-06-11 ENCOUNTER — Ambulatory Visit (INDEPENDENT_AMBULATORY_CARE_PROVIDER_SITE_OTHER): Payer: Medicare Other | Admitting: *Deleted

## 2013-06-11 DIAGNOSIS — I495 Sick sinus syndrome: Secondary | ICD-10-CM

## 2013-06-11 LAB — MDC_IDC_ENUM_SESS_TYPE_INCLINIC
Implantable Pulse Generator Serial Number: 15732
Lead Channel Impedance Value: 484 Ohm
Lead Channel Setting Pacing Amplitude: 4 V
Lead Channel Setting Pacing Pulse Width: 0.8 ms
Lead Channel Setting Sensing Sensitivity: 2 mV
MDC IDC MSMT BATTERY REMAINING LONGEVITY: 12 mo
MDC IDC MSMT BATTERY VOLTAGE: 2.62 V
MDC IDC MSMT LEADCHNL RV PACING THRESHOLD AMPLITUDE: 2.5 V
MDC IDC MSMT LEADCHNL RV PACING THRESHOLD PULSEWIDTH: 0.8 ms
MDC IDC MSMT LEADCHNL RV SENSING INTR AMPL: 4 mV

## 2013-06-11 NOTE — Progress Notes (Signed)
PPM check in office. 

## 2013-06-19 ENCOUNTER — Encounter: Payer: Self-pay | Admitting: Internal Medicine

## 2013-07-05 ENCOUNTER — Ambulatory Visit (HOSPITAL_COMMUNITY)
Admission: RE | Admit: 2013-07-05 | Discharge: 2013-07-05 | Disposition: A | Discharge status: Home / Self Care | Payer: Medicare Other | Source: Ambulatory Visit | Attending: Internal Medicine | Admitting: Internal Medicine

## 2013-07-05 ENCOUNTER — Inpatient Hospital Stay (HOSPITAL_COMMUNITY)
Admission: EM | Admit: 2013-07-05 | Discharge: 2013-07-09 | DRG: 392 | Disposition: A | Discharge status: Home / Self Care | Payer: Medicare Other | Attending: Internal Medicine | Admitting: Internal Medicine

## 2013-07-05 ENCOUNTER — Encounter (HOSPITAL_COMMUNITY): Payer: Self-pay | Admitting: Emergency Medicine

## 2013-07-05 ENCOUNTER — Other Ambulatory Visit (HOSPITAL_COMMUNITY): Payer: Self-pay | Admitting: Internal Medicine

## 2013-07-05 DIAGNOSIS — D72829 Elevated white blood cell count, unspecified: Secondary | ICD-10-CM | POA: Diagnosis present

## 2013-07-05 DIAGNOSIS — N183 Chronic kidney disease, stage 3 unspecified: Secondary | ICD-10-CM | POA: Diagnosis not present

## 2013-07-05 DIAGNOSIS — N184 Chronic kidney disease, stage 4 (severe): Secondary | ICD-10-CM | POA: Diagnosis not present

## 2013-07-05 DIAGNOSIS — Z87891 Personal history of nicotine dependence: Secondary | ICD-10-CM

## 2013-07-05 DIAGNOSIS — K631 Perforation of intestine (nontraumatic): Secondary | ICD-10-CM | POA: Diagnosis not present

## 2013-07-05 DIAGNOSIS — E785 Hyperlipidemia, unspecified: Secondary | ICD-10-CM | POA: Diagnosis present

## 2013-07-05 DIAGNOSIS — Z9049 Acquired absence of other specified parts of digestive tract: Secondary | ICD-10-CM

## 2013-07-05 DIAGNOSIS — N179 Acute kidney failure, unspecified: Secondary | ICD-10-CM | POA: Diagnosis present

## 2013-07-05 DIAGNOSIS — Z6830 Body mass index (BMI) 30.0-30.9, adult: Secondary | ICD-10-CM | POA: Diagnosis not present

## 2013-07-05 DIAGNOSIS — Z950 Presence of cardiac pacemaker: Secondary | ICD-10-CM | POA: Diagnosis present

## 2013-07-05 DIAGNOSIS — R319 Hematuria, unspecified: Secondary | ICD-10-CM | POA: Diagnosis not present

## 2013-07-05 DIAGNOSIS — K5900 Constipation, unspecified: Secondary | ICD-10-CM | POA: Diagnosis present

## 2013-07-05 DIAGNOSIS — I10 Essential (primary) hypertension: Secondary | ICD-10-CM | POA: Diagnosis not present

## 2013-07-05 DIAGNOSIS — E876 Hypokalemia: Secondary | ICD-10-CM | POA: Diagnosis present

## 2013-07-05 DIAGNOSIS — R1032 Left lower quadrant pain: Secondary | ICD-10-CM

## 2013-07-05 DIAGNOSIS — D696 Thrombocytopenia, unspecified: Secondary | ICD-10-CM | POA: Diagnosis not present

## 2013-07-05 DIAGNOSIS — M129 Arthropathy, unspecified: Secondary | ICD-10-CM | POA: Diagnosis present

## 2013-07-05 DIAGNOSIS — K5720 Diverticulitis of large intestine with perforation and abscess without bleeding: Secondary | ICD-10-CM

## 2013-07-05 DIAGNOSIS — N281 Cyst of kidney, acquired: Secondary | ICD-10-CM | POA: Diagnosis not present

## 2013-07-05 DIAGNOSIS — E538 Deficiency of other specified B group vitamins: Secondary | ICD-10-CM | POA: Diagnosis not present

## 2013-07-05 DIAGNOSIS — J449 Chronic obstructive pulmonary disease, unspecified: Secondary | ICD-10-CM | POA: Diagnosis not present

## 2013-07-05 DIAGNOSIS — M810 Age-related osteoporosis without current pathological fracture: Secondary | ICD-10-CM | POA: Diagnosis present

## 2013-07-05 DIAGNOSIS — N189 Chronic kidney disease, unspecified: Secondary | ICD-10-CM

## 2013-07-05 DIAGNOSIS — I129 Hypertensive chronic kidney disease with stage 1 through stage 4 chronic kidney disease, or unspecified chronic kidney disease: Secondary | ICD-10-CM | POA: Diagnosis present

## 2013-07-05 DIAGNOSIS — R109 Unspecified abdominal pain: Secondary | ICD-10-CM

## 2013-07-05 DIAGNOSIS — K5732 Diverticulitis of large intestine without perforation or abscess without bleeding: Principal | ICD-10-CM

## 2013-07-05 DIAGNOSIS — K5792 Diverticulitis of intestine, part unspecified, without perforation or abscess without bleeding: Secondary | ICD-10-CM | POA: Diagnosis present

## 2013-07-05 DIAGNOSIS — D3500 Benign neoplasm of unspecified adrenal gland: Secondary | ICD-10-CM | POA: Diagnosis not present

## 2013-07-05 HISTORY — DX: Thrombocytopenia, unspecified: D69.6

## 2013-07-05 HISTORY — DX: Chronic kidney disease, unspecified: N18.9

## 2013-07-05 HISTORY — DX: Deficiency of other specified B group vitamins: E53.8

## 2013-07-05 HISTORY — DX: Diverticulitis of intestine, part unspecified, without perforation or abscess without bleeding: K57.92

## 2013-07-05 LAB — CBC WITH DIFFERENTIAL/PLATELET
BASOS ABS: 0 10*3/uL (ref 0.0–0.1)
BASOS PCT: 0 % (ref 0–1)
EOS ABS: 0.3 10*3/uL (ref 0.0–0.7)
Eosinophils Relative: 3 % (ref 0–5)
HCT: 44.3 % (ref 36.0–46.0)
Hemoglobin: 14.5 g/dL (ref 12.0–15.0)
Lymphocytes Relative: 13 % (ref 12–46)
Lymphs Abs: 1.6 10*3/uL (ref 0.7–4.0)
MCH: 30.1 pg (ref 26.0–34.0)
MCHC: 32.7 g/dL (ref 30.0–36.0)
MCV: 91.9 fL (ref 78.0–100.0)
Monocytes Absolute: 0.9 10*3/uL (ref 0.1–1.0)
Monocytes Relative: 8 % (ref 3–12)
NEUTROS PCT: 76 % (ref 43–77)
Neutro Abs: 9.2 10*3/uL — ABNORMAL HIGH (ref 1.7–7.7)
Platelets: 137 10*3/uL — ABNORMAL LOW (ref 150–400)
RBC: 4.82 MIL/uL (ref 3.87–5.11)
RDW: 13.8 % (ref 11.5–15.5)
WBC: 12.1 10*3/uL — ABNORMAL HIGH (ref 4.0–10.5)

## 2013-07-05 LAB — BASIC METABOLIC PANEL
BUN: 33 mg/dL — ABNORMAL HIGH (ref 6–23)
CHLORIDE: 103 meq/L (ref 96–112)
CO2: 28 mEq/L (ref 19–32)
Calcium: 9.4 mg/dL (ref 8.4–10.5)
Creatinine, Ser: 2.21 mg/dL — ABNORMAL HIGH (ref 0.50–1.10)
GFR calc Af Amer: 23 mL/min — ABNORMAL LOW (ref >90)
GFR, EST NON AFRICAN AMERICAN: 20 mL/min — AB (ref >90)
Glucose, Bld: 96 mg/dL (ref 70–99)
Potassium: 3.5 mEq/L — ABNORMAL LOW (ref 3.7–5.3)
Sodium: 145 mEq/L (ref 137–147)

## 2013-07-05 MED ORDER — ONDANSETRON HCL 4 MG/2ML IJ SOLN
4.0000 mg | Freq: Four times a day (QID) | INTRAMUSCULAR | Status: DC | PRN
  Administered 2013-07-06 – 2013-07-09 (×2): 4 mg via INTRAVENOUS
  Filled 2013-07-05 (×2): qty 2

## 2013-07-05 MED ORDER — POTASSIUM CHLORIDE 10 MEQ/100ML IV SOLN
10.0000 meq | INTRAVENOUS | Status: AC
  Administered 2013-07-05 (×2): 10 meq via INTRAVENOUS
  Filled 2013-07-05 (×2): qty 100

## 2013-07-05 MED ORDER — METRONIDAZOLE IN NACL 5-0.79 MG/ML-% IV SOLN
500.0000 mg | Freq: Once | INTRAVENOUS | Status: AC
  Administered 2013-07-05: 500 mg via INTRAVENOUS
  Filled 2013-07-05: qty 100

## 2013-07-05 MED ORDER — SODIUM CHLORIDE 0.9 % IV BOLUS (SEPSIS)
500.0000 mL | Freq: Once | INTRAVENOUS | Status: AC
  Administered 2013-07-05: 500 mL via INTRAVENOUS

## 2013-07-05 MED ORDER — HYDROMORPHONE HCL PF 1 MG/ML IJ SOLN: 0.5000 mg | INTRAMUSCULAR | Status: DC | PRN

## 2013-07-05 MED ORDER — ONDANSETRON HCL 4 MG/2ML IJ SOLN: 4.0000 mg | Freq: Three times a day (TID) | INTRAMUSCULAR | Status: DC | PRN

## 2013-07-05 MED ORDER — CIPROFLOXACIN IN D5W 400 MG/200ML IV SOLN
400.0000 mg | Freq: Once | INTRAVENOUS | Status: AC
  Administered 2013-07-05: 400 mg via INTRAVENOUS
  Filled 2013-07-05: qty 200

## 2013-07-05 MED ORDER — POTASSIUM CHLORIDE 10 MEQ/100ML IV SOLN: 10.0000 meq | INTRAVENOUS | Status: DC

## 2013-07-05 MED ORDER — SODIUM CHLORIDE 0.9 % IV SOLN: INTRAVENOUS | Status: DC

## 2013-07-05 MED ORDER — METRONIDAZOLE IN NACL 5-0.79 MG/ML-% IV SOLN
500.0000 mg | Freq: Three times a day (TID) | INTRAVENOUS | Status: DC
  Administered 2013-07-05 – 2013-07-09 (×12): 500 mg via INTRAVENOUS
  Filled 2013-07-05 (×12): qty 100

## 2013-07-05 MED ORDER — ACETAMINOPHEN 325 MG PO TABS: 650.0000 mg | ORAL_TABLET | Freq: Four times a day (QID) | ORAL | Status: DC | PRN

## 2013-07-05 MED ORDER — ONDANSETRON HCL 4 MG PO TABS: 4.0000 mg | ORAL_TABLET | Freq: Four times a day (QID) | ORAL | Status: DC | PRN

## 2013-07-05 MED ORDER — ALBUTEROL SULFATE (2.5 MG/3ML) 0.083% IN NEBU: 2.5000 mg | INHALATION_SOLUTION | RESPIRATORY_TRACT | Status: DC | PRN

## 2013-07-05 MED ORDER — POTASSIUM CHLORIDE IN NACL 20-0.9 MEQ/L-% IV SOLN
INTRAVENOUS | Status: DC
  Administered 2013-07-05 – 2013-07-09 (×6): via INTRAVENOUS

## 2013-07-05 MED ORDER — ACETAMINOPHEN 650 MG RE SUPP: 650.0000 mg | Freq: Four times a day (QID) | RECTAL | Status: DC | PRN

## 2013-07-05 MED ORDER — FAMOTIDINE IN NACL 20-0.9 MG/50ML-% IV SOLN
20.0000 mg | INTRAVENOUS | Status: DC
  Administered 2013-07-05 – 2013-07-08 (×4): 20 mg via INTRAVENOUS
  Filled 2013-07-05 (×5): qty 50

## 2013-07-05 MED ORDER — CIPROFLOXACIN IN D5W 400 MG/200ML IV SOLN
400.0000 mg | INTRAVENOUS | Status: DC
  Administered 2013-07-06 – 2013-07-09 (×4): 400 mg via INTRAVENOUS
  Filled 2013-07-05 (×4): qty 200

## 2013-07-05 NOTE — ED Notes (Signed)
Per Pt, received CT scan this am and per Dr. Gerarda Fraction to be seen and admitted today for diverticulitis. Pt reports abdominal pain x1 week. Pt reports intermittent nausea. Per Pt, pt received "injection for pain and nausea" at PCP office prior to CT scan being completed.
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
## 2013-07-05 NOTE — ED Provider Notes (Signed)
Medical screening examination/treatment/procedure(s) were performed by non-physician practitioner and as supervising physician I was immediately available for consultation/collaboration.   EKG Interpretation None        Maudry Diego, MD 07/05/13 1402

## 2013-07-05 NOTE — Progress Notes (Signed)
ANTIBIOTIC CONSULT NOTE - INITIAL  Pharmacy Consult for Cipro Indication: Intra-abdominal infection  Allergies  Allergen Reactions  . Codeine Nausea And Vomiting  . Sulfonamide Derivatives Other (See Comments)    Tongue rash    Patient Measurements: Height: 5\' 3"  (160 cm) Weight: 169 lb (76.658 kg) IBW/kg (Calculated) : 52.4 Adjusted Body Weight:   Vital Signs: Temp: 98.1 F (36.7 C) (06/25 1244) Temp src: Oral (06/25 1029) BP: 120/80 mmHg (06/25 1244) Pulse Rate: 60 (06/25 1244) Intake/Output from previous day:   Intake/Output from this shift:    Labs:  Recent Labs  07/05/13 1041  WBC 12.1*  HGB 14.5  PLT 137*  CREATININE 2.21*   Estimated Creatinine Clearance: 20.2 ml/min (by C-G formula based on Cr of 2.21). No results found for this basename: VANCOTROUGH, VANCOPEAK, VANCORANDOM, GENTTROUGH, GENTPEAK, GENTRANDOM, TOBRATROUGH, TOBRAPEAK, TOBRARND, AMIKACINPEAK, AMIKACINTROU, AMIKACIN,  in the last 72 hours   Microbiology: No results found for this or any previous visit (from the past 720 hour(s)).  Medical History: Past Medical History  Diagnosis Date  . Hypertension   . Hyperlipidemia   . Pacemaker   . Pain in joint, shoulder region   . Blood in urine   . History of blood clots   . CKD (chronic kidney disease)     sees Dr Yvonne Kendall insufficiency  . H/O hiatal hernia   . Arthritis   . Osteoporosis   . Sinoatrial node dysfunction   . Diverticulitis Recurrent    2007-status post partial colectomy    Medications:  Scheduled:  . [START ON 07/06/2013] ciprofloxacin  400 mg Intravenous Q24H  . famotidine (PEPCID) IV  20 mg Intravenous Q24H  . metronidazole  500 mg Intravenous Q8H  . potassium chloride  10 mEq Intravenous Q1 Hr x 2   Assessment: Known history of diverticulitis Cipro 400 mg IV given in ED CrCl 20.2 ml/min  Goal of Therapy:  Eradicate infection  Plan:  Cipro 400 mg IV every 24 hours, next dose 07/06/2013 at 11 AM Monitor renal  function Labs per protocol F/U LOT  Abner Greenspan, Kylieann Eagles Bennett 07/05/2013,4:35 PM

## 2013-07-06 DIAGNOSIS — D696 Thrombocytopenia, unspecified: Secondary | ICD-10-CM | POA: Diagnosis not present

## 2013-07-06 DIAGNOSIS — K631 Perforation of intestine (nontraumatic): Secondary | ICD-10-CM | POA: Diagnosis not present

## 2013-07-06 DIAGNOSIS — I10 Essential (primary) hypertension: Secondary | ICD-10-CM

## 2013-07-06 DIAGNOSIS — N179 Acute kidney failure, unspecified: Secondary | ICD-10-CM

## 2013-07-06 DIAGNOSIS — K5732 Diverticulitis of large intestine without perforation or abscess without bleeding: Secondary | ICD-10-CM | POA: Diagnosis not present

## 2013-07-06 LAB — COMPREHENSIVE METABOLIC PANEL
ALT: 9 U/L (ref 0–35)
AST: 12 U/L (ref 0–37)
Albumin: 2.9 g/dL — ABNORMAL LOW (ref 3.5–5.2)
Alkaline Phosphatase: 56 U/L (ref 39–117)
BUN: 26 mg/dL — ABNORMAL HIGH (ref 6–23)
CO2: 26 mEq/L (ref 19–32)
Calcium: 8.2 mg/dL — ABNORMAL LOW (ref 8.4–10.5)
Chloride: 110 mEq/L (ref 96–112)
Creatinine, Ser: 1.95 mg/dL — ABNORMAL HIGH (ref 0.50–1.10)
GFR calc non Af Amer: 23 mL/min — ABNORMAL LOW (ref >90)
GFR, EST AFRICAN AMERICAN: 27 mL/min — AB (ref >90)
Glucose, Bld: 89 mg/dL (ref 70–99)
POTASSIUM: 4.2 meq/L (ref 3.7–5.3)
SODIUM: 147 meq/L (ref 137–147)
TOTAL PROTEIN: 5.6 g/dL — AB (ref 6.0–8.3)
Total Bilirubin: 0.4 mg/dL (ref 0.3–1.2)

## 2013-07-06 LAB — VITAMIN B12: VITAMIN B 12: 137 pg/mL — AB (ref 211–911)

## 2013-07-06 LAB — CBC
HEMATOCRIT: 36.4 % (ref 36.0–46.0)
Hemoglobin: 11.9 g/dL — ABNORMAL LOW (ref 12.0–15.0)
MCH: 30.3 pg (ref 26.0–34.0)
MCHC: 32.7 g/dL (ref 30.0–36.0)
MCV: 92.6 fL (ref 78.0–100.0)
Platelets: 111 10*3/uL — ABNORMAL LOW (ref 150–400)
RBC: 3.93 MIL/uL (ref 3.87–5.11)
RDW: 13.8 % (ref 11.5–15.5)
WBC: 7 10*3/uL (ref 4.0–10.5)

## 2013-07-06 LAB — MAGNESIUM: Magnesium: 1.9 mg/dL (ref 1.5–2.5)

## 2013-07-06 LAB — TSH: TSH: 1.95 u[IU]/mL (ref 0.350–4.500)

## 2013-07-06 NOTE — Care Management Note (Addendum)
    Page 1 of 1   07/09/2013     10:48:09 AM CARE MANAGEMENT NOTE 07/09/2013  Patient:  Ashley Olsen, Ashley Olsen   Account Number:  [id number]  Date Initiated:  07/06/2013  Documentation initiated by:  Theophilus Kinds  Subjective/Objective Assessment:   Pt admitted from home with diverticulitis, Pt lives alone and has a daughter that lives close by and is very active in the care of the pt. Pt is independent with ADL's.     Action/Plan:   No CM needs noted.   Anticipated DC Date:  07/09/2013   Anticipated DC Plan:  [place]  CM consult      Choice offered to / List presented to:             Status of service:  Completed, signed off Medicare Important Message given?  YES (If response is "NO", the following Medicare IM given date fields will be blank) Date Medicare IM given:  07/06/2013 Date Additional Medicare IM given:  07/09/2013  Discharge Disposition:  HOME/SELF CARE  Per UR Regulation:    If discussed at Long Length of Stay Meetings, dates discussed:    Comments:  07/09/13 [place], RN BSN CM Pt potential discharge today. No CM needs noted.  07/06/13 [place], RN BSN CM

## 2013-07-06 NOTE — Progress Notes (Signed)
Patient ambulated in hallway without difficulty,no c/o pain or discomfort noted.

## 2013-07-06 NOTE — Progress Notes (Signed)
SURGERY/[place]  Filed Vitals:   07/06/13 0455  BP: 96/63  Pulse: 63  Temp: 97.3 F (36.3 C)  Resp: 18     Pt seen and examined and studies reviewed.  In essence, This 80 yr old W. Female with hx of diverticular disease which required partial L colectomy in past, presets with recurrence. She states she began with LLQ pain on Sat which resolved and then returned obliging her to seek help at ER at [facility].  She is improving with antibiotics and bowel rest.  Clinically her abdomen is soft without much guarding.  No tenderness somatically over proximal transverse colon area but some mild tenderness in LLQ.  Agree with present course of treatment and will follow but pt states that if surgery were necessary she would prefer to go to [facility] where all her other Doctors are. I will help as  needed in this rather limited role.  Thanks for consult.

## 2013-07-06 NOTE — Progress Notes (Signed)
PATIENT DETAILS Name: Ashley Olsen Age: 80 y.o. Sex: female Date of Birth: 1932/02/24 Admit Date: 07/05/2013 Admitting Physician Rexene Alberts, MD [id number], Betsy Coder, MD  Subjective: Feels significantly better, less abdominal pain.  Assessment/Plan: Principal Problem:   Acute diverticulitis with microperforation. - Admitted, kept n.p.o., started on Cipro/Flagyl-day 2. Significantly better, leukocytosis has resolved, abdominal exam is benign, continue n.p.o. status for another 24 hours, before starting clear liquids. Await general surgery evaluation (Dr. Romona Curls)  Active Problems:   HYPERTENSION, UNSPECIFIED - Blood pressure currently controlled without the use of antihypertensive medications, resume amlodipine when able.  Acute on chronic kidney failure stage III - Acute renal failure likely secondary to above, resolving with IV fluids. Continue to monitor.  Mild thrombocytopenia -? Etiology,  continue to monitor. We'll use SCDs for DVT prophylaxis.   History of dyslipidemia - Resume statins on discharge.  History of symptomatic bradycardia - s/p PPM placement  Disposition: Remain inpatient  DVT Prophylaxis:  SCD's and ambulation every shift.  Code Status: Full code   Family Communication Daughter at bedside  Procedures:  None  CONSULTS:  general surgery  Time spent 40 minutes-which includes 50% of the time with face-to-face with patient/ family and coordinating care related to the above assessment and plan.    MEDICATIONS: Scheduled Meds: . ciprofloxacin  400 mg Intravenous Q24H  . famotidine (PEPCID) IV  20 mg Intravenous Q24H  . metronidazole  500 mg Intravenous Q8H   Continuous Infusions: . 0.9 % NaCl with KCl 20 mEq / L 100 mL/hr at 07/06/13 0451   PRN Meds:.acetaminophen, acetaminophen, albuterol, HYDROmorphone (DILAUDID) injection, ondansetron (ZOFRAN) IV, ondansetron  Antibiotics: Anti-infectives   Start     Dose/Rate  Route Frequency Ordered Stop   07/06/13 1100  ciprofloxacin (CIPRO) IVPB 400 mg     400 mg 200 mL/hr over 60 Minutes Intravenous Every 24 hours 07/05/13 1633     07/05/13 1900  metroNIDAZOLE (FLAGYL) IVPB 500 mg     500 mg 100 mL/hr over 60 Minutes Intravenous Every 8 hours 07/05/13 1620     07/05/13 1130  ciprofloxacin (CIPRO) IVPB 400 mg     400 mg 200 mL/hr over 60 Minutes Intravenous  Once 07/05/13 1126 07/05/13 1241   07/05/13 1130  metroNIDAZOLE (FLAGYL) IVPB 500 mg     500 mg 100 mL/hr over 60 Minutes Intravenous  Once 07/05/13 1126 07/05/13 1241       PHYSICAL EXAM: Vital signs in last 24 hours: Filed Vitals:   07/05/13 1244 07/05/13 2233 07/06/13 0455 07/06/13 0612  BP: 120/80 145/55 96/63   Pulse: 60 62 63   Temp: 98.1 F (36.7 C) 98.3 F (36.8 C) 97.3 F (36.3 C)   TempSrc:  Oral Oral   Resp: 18 18 18    Height: 5\' 3"  (1.6 m)     Weight: 76.658 kg (169 lb)   78.9 kg (173 lb 15.1 oz)  SpO2: 96% 91% 93%     Weight change:  Filed Weights   07/05/13 1029 07/05/13 1244 07/06/13 0612  Weight: 76.658 kg (169 lb) 76.658 kg (169 lb) 78.9 kg (173 lb 15.1 oz)   Body mass index is 30.82 kg/(m^2).   Gen Exam: Awake and alert with clear speech.  Neck: Supple, No JVD.   Chest: B/L Clear.   CVS: S1 S2 Regular, no murmurs.  Abdomen: soft, BS +, non tender, non distended.  Extremities: no edema, lower extremities warm to touch. Neurologic: Non Focal.  Skin: No Rash.   Wounds: N/A.   Intake/Output from previous day:  Intake/Output Summary (Last 24 hours) at 07/06/13 1250 Last data filed at 07/06/13 0800  Gross per 24 hour  Intake 1686.67 ml  Output    600 ml  Net 1086.67 ml     LAB RESULTS: CBC  Recent Labs Lab 07/05/13 1041 07/06/13 0546  WBC 12.1* 7.0  HGB 14.5 11.9*  HCT 44.3 36.4  PLT 137* 111*  MCV 91.9 92.6  MCH 30.1 30.3  MCHC 32.7 32.7  RDW 13.8 13.8  LYMPHSABS 1.6  --   MONOABS 0.9  --   EOSABS 0.3  --   BASOSABS 0.0  --      Chemistries   Recent Labs Lab 07/05/13 1041 07/06/13 0546  NA 145 147  K 3.5* 4.2  CL 103 110  CO2 28 26  GLUCOSE 96 89  BUN 33* 26*  CREATININE 2.21* 1.95*  CALCIUM 9.4 8.2*  MG  --  1.9    CBG: No results found for this basename: GLUCAP,  in the last 168 hours  GFR Estimated Creatinine Clearance: 23.3 ml/min (by C-G formula based on Cr of 1.95).  Coagulation profile No results found for this basename: INR, PROTIME,  in the last 168 hours  Cardiac Enzymes No results found for this basename: CK, CKMB, TROPONINI, MYOGLOBIN,  in the last 168 hours  No components found with this basename: POCBNP,  No results found for this basename: DDIMER,  in the last 72 hours No results found for this basename: HGBA1C,  in the last 72 hours No results found for this basename: CHOL, HDL, LDLCALC, TRIG, CHOLHDL, LDLDIRECT,  in the last 72 hours No results found for this basename: TSH, T4TOTAL, FREET3, T3FREE, THYROIDAB,  in the last 72 hours No results found for this basename: VITAMINB12, FOLATE, FERRITIN, TIBC, IRON, RETICCTPCT,  in the last 72 hours No results found for this basename: LIPASE, AMYLASE,  in the last 72 hours  Urine Studies No results found for this basename: UACOL, UAPR, USPG, UPH, UTP, UGL, UKET, UBIL, UHGB, UNIT, UROB, ULEU, UEPI, UWBC, URBC, UBAC, CAST, CRYS, UCOM, BILUA,  in the last 72 hours  MICROBIOLOGY: No results found for this or any previous visit (from the past 240 hour(s)).  RADIOLOGY STUDIES/RESULTS: Ct Abdomen Pelvis Wo Contrast  07/05/2013   CLINICAL DATA:  Abdominal pain, unspecified site/CKD (chronic kidney disease), unspecified stage/Hematuria/LLQ pain  EXAM: CT ABDOMEN AND PELVIS WITHOUT CONTRAST  TECHNIQUE: Multidetector CT imaging of the abdomen and pelvis was performed following the standard protocol without IV contrast.  COMPARISON:  CT abdomen pelvis 06/17/2010, abdominal CT 11/27/2007  FINDINGS: Linear areas of increased density within the  lung bases scar versus atelectasis.  Stable multiple low attenuating foci within the right and left lobes of liver. Stable 2.4 cm low attenuating nodule anterior periphery of the left lobe of the liver. Liver is otherwise unremarkable. Stable right adrenal adenoma. The left adrenal is unremarkable.  Stable benign-appearing cysts within the right kidney. The kidneys are atrophic. There is no evidence of obstructive uropathy nor renal calculus disease.  Atherosclerotic calcification aorta. No evidence of abdominal aortic aneurysm.  The pancreas and spleen are unremarkable.  Transverse colon (images 32 through 42 series 2) there is diffuse inflammatory change in the surrounding mesenteric fat, mild bowel wall thickening, and small punctate foci of air within the mesenteric. Foci of diverticulosis are appreciated involving dissection of bowel. No drainable loculated fluid collections are appreciated. Further  areas of diverticulosis are appreciated within the descending colon and to a lesser extent sigmoid colon.  There is no evidence of bowel obstruction.  Within the limitations of a noncontrast CT, no further evidence of abdominal or pelvic masses, loculated fluid collections, nor adenopathy.  Very small fat containing umbilical hernia is appreciated. A fat containing right inguinal hernia is appreciated. No evidence of an inguinal hernia on the left.  No aggressive appearing osseous lesions.  IMPRESSION: Diverticulitis with microperforation involving the proximal transverse colon.  Stable benign appearing low attenuating foci within the liver.  Stable Bosniak type 1 cysts right kidney  Stable right adrenal adenoma   Electronically Signed   By: Margaree Mackintosh M.D.   On: 07/05/2013 09:38    Oren Binet, MD  [facility] Pager:[phone]  If 7PM-7AM, please contact night-coverage [email or web address] Password [email or web address] 07/06/2013, 12:50 PM   LOS: 1 day   **Disclaimer: This note may have been dictated with  voice recognition software. Similar sounding words can inadvertently be transcribed and this note may contain transcription errors which may not have been corrected upon publication of note.**

## 2013-07-06 NOTE — Progress Notes (Signed)
UR chart review completed.  

## 2013-07-07 DIAGNOSIS — K631 Perforation of intestine (nontraumatic): Secondary | ICD-10-CM | POA: Diagnosis not present

## 2013-07-07 DIAGNOSIS — K5732 Diverticulitis of large intestine without perforation or abscess without bleeding: Secondary | ICD-10-CM | POA: Diagnosis not present

## 2013-07-07 LAB — BASIC METABOLIC PANEL
BUN: 20 mg/dL (ref 6–23)
CHLORIDE: 110 meq/L (ref 96–112)
CO2: 23 meq/L (ref 19–32)
Calcium: 7.7 mg/dL — ABNORMAL LOW (ref 8.4–10.5)
Creatinine, Ser: 1.78 mg/dL — ABNORMAL HIGH (ref 0.50–1.10)
GFR calc non Af Amer: 26 mL/min — ABNORMAL LOW (ref >90)
GFR, EST AFRICAN AMERICAN: 30 mL/min — AB (ref >90)
Glucose, Bld: 78 mg/dL (ref 70–99)
POTASSIUM: 4.2 meq/L (ref 3.7–5.3)
Sodium: 145 mEq/L (ref 137–147)

## 2013-07-07 LAB — CBC
HEMATOCRIT: 35 % — AB (ref 36.0–46.0)
HEMOGLOBIN: 11.5 g/dL — AB (ref 12.0–15.0)
MCH: 30.5 pg (ref 26.0–34.0)
MCHC: 32.9 g/dL (ref 30.0–36.0)
MCV: 92.8 fL (ref 78.0–100.0)
Platelets: 113 10*3/uL — ABNORMAL LOW (ref 150–400)
RBC: 3.77 MIL/uL — ABNORMAL LOW (ref 3.87–5.11)
RDW: 13.8 % (ref 11.5–15.5)
WBC: 7.4 10*3/uL (ref 4.0–10.5)

## 2013-07-07 NOTE — Progress Notes (Signed)
Surgery  Filed Vitals:   07/07/13 0608  BP: 102/61  Pulse: 67  Temp: 97.6 F (36.4 C)  Resp: 20   No acute surgical change. Abdomen soft and pt remains afebrile with no bump in her WBC.  Tolerating liquids well.  I wouildnot advance diet until she is pain free and continue antibiotics total 10 -14 days.

## 2013-07-07 NOTE — Progress Notes (Signed)
PATIENT DETAILS Name: Ashley Olsen Age: 80 y.o. Sex: female Date of Birth: 30-Jul-1932 Admit Date: 07/05/2013 Admitting Physician Rexene Alberts, MD [id number], Betsy Coder, MD  Subjective: Feels significantly better, less abdominal pain.  Assessment/Plan:     Acute diverticulitis with microperforation. - Admitted, kept n.p.o., started on Cipro/Flagyl on 07/05/2013. Significantly better, leukocytosis has resolved, abdominal exam is benign, start on clear liquids and monitor closely. Being followed by general surgery evaluation (Dr. Romona Curls)       HYPERTENSION, UNSPECIFIED - Blood pressure currently controlled without the use of antihypertensive medications, resume amlodipine when able.    Acute on chronic kidney failure stage III - Baseline creatinine around 1.72 years ago, with gentle hydration she is back to baseline    Mild thrombocytopenia -? Etiology,  continue to monitor. We'll use SCDs for DVT prophylaxis.     History of dyslipidemia - Resume statins on discharge.     History of symptomatic bradycardia - s/p PPM placement      Disposition: Remain inpatient  DVT Prophylaxis:  SCD's and ambulation every shift.     Code Status: Full code   Family Communication Daughter at bedside  Procedures:  None  CONSULTS:  general surgery  Time spent 40 minutes-which includes 50% of the time with face-to-face with patient/ family and coordinating care related to the above assessment and plan.    MEDICATIONS: Scheduled Meds: . ciprofloxacin  400 mg Intravenous Q24H  . famotidine (PEPCID) IV  20 mg Intravenous Q24H  . metronidazole  500 mg Intravenous Q8H   Continuous Infusions: . 0.9 % NaCl with KCl 20 mEq / L 100 mL/hr at 07/07/13 0635   PRN Meds:.acetaminophen, acetaminophen, albuterol, HYDROmorphone (DILAUDID) injection, ondansetron (ZOFRAN) IV, ondansetron  Antibiotics: Anti-infectives   Start     Dose/Rate Route  Frequency Ordered Stop   07/06/13 1100  ciprofloxacin (CIPRO) IVPB 400 mg     400 mg 200 mL/hr over 60 Minutes Intravenous Every 24 hours 07/05/13 1633     07/05/13 1900  metroNIDAZOLE (FLAGYL) IVPB 500 mg     500 mg 100 mL/hr over 60 Minutes Intravenous Every 8 hours 07/05/13 1620     07/05/13 1130  ciprofloxacin (CIPRO) IVPB 400 mg     400 mg 200 mL/hr over 60 Minutes Intravenous  Once 07/05/13 1126 07/05/13 1241   07/05/13 1130  metroNIDAZOLE (FLAGYL) IVPB 500 mg     500 mg 100 mL/hr over 60 Minutes Intravenous  Once 07/05/13 1126 07/05/13 1241       PHYSICAL EXAM: Vital signs in last 24 hours: Filed Vitals:   07/06/13 0612 07/06/13 1417 07/06/13 2042 07/07/13 0608  BP:  111/60 127/79 102/61  Pulse:  62 62 67  Temp:  98.1 F (36.7 C) 97.7 F (36.5 C) 97.6 F (36.4 C)  TempSrc:  Oral Oral Oral  Resp:  20 20 20   Height:      Weight: 78.9 kg (173 lb 15.1 oz)   79.4 kg (175 lb 0.7 oz)  SpO2:  98% 96% 99%    Weight change: 2.742 kg (6 lb 0.7 oz) Filed Weights   07/05/13 1244 07/06/13 0612 07/07/13 0608  Weight: 76.658 kg (169 lb) 78.9 kg (173 lb 15.1 oz) 79.4 kg (175 lb 0.7 oz)   Body mass index is 31.02 kg/(m^2).   Gen Exam: Awake and alert with clear speech.  Neck: Supple, No JVD.   Chest: B/L Clear.   CVS: S1 S2 Regular, no  murmurs.  Abdomen: soft, BS +, mildly tender upper quadrent, non distended.  Extremities: no edema, lower extremities warm to touch. Neurologic: Non Focal.   Skin: No Rash.   Wounds: N/A.   Intake/Output from previous day:  Intake/Output Summary (Last 24 hours) at 07/07/13 0951 Last data filed at 07/06/13 1827  Gross per 24 hour  Intake 1716.67 ml  Output      0 ml  Net 1716.67 ml     LAB RESULTS: CBC  Recent Labs Lab 07/05/13 1041 07/06/13 0546 07/07/13 0512  WBC 12.1* 7.0 7.4  HGB 14.5 11.9* 11.5*  HCT 44.3 36.4 35.0*  PLT 137* 111* 113*  MCV 91.9 92.6 92.8  MCH 30.1 30.3 30.5  MCHC 32.7 32.7 32.9  RDW 13.8 13.8 13.8    LYMPHSABS 1.6  --   --   MONOABS 0.9  --   --   EOSABS 0.3  --   --   BASOSABS 0.0  --   --     Chemistries   Recent Labs Lab 07/05/13 1041 07/06/13 0546 07/07/13 0512  NA 145 147 145  K 3.5* 4.2 4.2  CL 103 110 110  CO2 28 26 23   GLUCOSE 96 89 78  BUN 33* 26* 20  CREATININE 2.21* 1.95* 1.78*  CALCIUM 9.4 8.2* 7.7*  MG  --  1.9  --     CBG: No results found for this basename: GLUCAP,  in the last 168 hours  GFR Estimated Creatinine Clearance: 25.6 ml/min (by C-G formula based on Cr of 1.78).  Coagulation profile No results found for this basename: INR, PROTIME,  in the last 168 hours  Cardiac Enzymes No results found for this basename: CK, CKMB, TROPONINI, MYOGLOBIN,  in the last 168 hours  No components found with this basename: POCBNP,  No results found for this basename: DDIMER,  in the last 72 hours No results found for this basename: HGBA1C,  in the last 72 hours No results found for this basename: CHOL, HDL, LDLCALC, TRIG, CHOLHDL, LDLDIRECT,  in the last 72 hours  Recent Labs  07/05/13 2139  TSH 1.950    Recent Labs  07/06/13 0546  VITAMINB12 137*   No results found for this basename: LIPASE, AMYLASE,  in the last 72 hours  Urine Studies No results found for this basename: UACOL, UAPR, USPG, UPH, UTP, UGL, UKET, UBIL, UHGB, UNIT, UROB, ULEU, UEPI, UWBC, URBC, UBAC, CAST, CRYS, UCOM, BILUA,  in the last 72 hours  MICROBIOLOGY: No results found for this or any previous visit (from the past 240 hour(s)).  RADIOLOGY STUDIES/RESULTS: Ct Abdomen Pelvis Wo Contrast  07/05/2013   CLINICAL DATA:  Abdominal pain, unspecified site/CKD (chronic kidney disease), unspecified stage/Hematuria/LLQ pain  EXAM: CT ABDOMEN AND PELVIS WITHOUT CONTRAST  TECHNIQUE: Multidetector CT imaging of the abdomen and pelvis was performed following the standard protocol without IV contrast.  COMPARISON:  CT abdomen pelvis 06/17/2010, abdominal CT 11/27/2007  FINDINGS: Linear  areas of increased density within the lung bases scar versus atelectasis.  Stable multiple low attenuating foci within the right and left lobes of liver. Stable 2.4 cm low attenuating nodule anterior periphery of the left lobe of the liver. Liver is otherwise unremarkable. Stable right adrenal adenoma. The left adrenal is unremarkable.  Stable benign-appearing cysts within the right kidney. The kidneys are atrophic. There is no evidence of obstructive uropathy nor renal calculus disease.  Atherosclerotic calcification aorta. No evidence of abdominal aortic aneurysm.  The pancreas and spleen  are unremarkable.  Transverse colon (images 32 through 42 series 2) there is diffuse inflammatory change in the surrounding mesenteric fat, mild bowel wall thickening, and small punctate foci of air within the mesenteric. Foci of diverticulosis are appreciated involving dissection of bowel. No drainable loculated fluid collections are appreciated. Further areas of diverticulosis are appreciated within the descending colon and to a lesser extent sigmoid colon.  There is no evidence of bowel obstruction.  Within the limitations of a noncontrast CT, no further evidence of abdominal or pelvic masses, loculated fluid collections, nor adenopathy.  Very small fat containing umbilical hernia is appreciated. A fat containing right inguinal hernia is appreciated. No evidence of an inguinal hernia on the left.  No aggressive appearing osseous lesions.  IMPRESSION: Diverticulitis with microperforation involving the proximal transverse colon.  Stable benign appearing low attenuating foci within the liver.  Stable Bosniak type 1 cysts right kidney  Stable right adrenal adenoma   Electronically Signed   By: Margaree Mackintosh M.D.   On: 07/05/2013 09:38    Thurnell Lose, MD  [facility] Pager:[phone]  If 7PM-7AM, please contact night-coverage [email or web address] Password [email or web address] 07/07/2013, 9:51 AM   LOS: 2 days   **Disclaimer:  This note may have been dictated with voice recognition software. Similar sounding words can inadvertently be transcribed and this note may contain transcription errors which may not have been corrected upon publication of note.**

## 2013-07-07 NOTE — Progress Notes (Signed)
Pt consumed 100% of dinner. No c/o pain or nausea at this time.

## 2013-07-07 NOTE — Progress Notes (Signed)
Pt up to chair most of the day and ambulating hallways without c/o pain or discomfort.  Will continue to monitor.

## 2013-07-08 DIAGNOSIS — K631 Perforation of intestine (nontraumatic): Secondary | ICD-10-CM | POA: Diagnosis not present

## 2013-07-08 DIAGNOSIS — D696 Thrombocytopenia, unspecified: Secondary | ICD-10-CM | POA: Diagnosis not present

## 2013-07-08 DIAGNOSIS — K5732 Diverticulitis of large intestine without perforation or abscess without bleeding: Secondary | ICD-10-CM | POA: Diagnosis not present

## 2013-07-08 DIAGNOSIS — N179 Acute kidney failure, unspecified: Secondary | ICD-10-CM | POA: Diagnosis not present

## 2013-07-08 LAB — CBC
HEMATOCRIT: 37.6 % (ref 36.0–46.0)
HEMOGLOBIN: 12.2 g/dL (ref 12.0–15.0)
MCH: 29.8 pg (ref 26.0–34.0)
MCHC: 32.4 g/dL (ref 30.0–36.0)
MCV: 91.9 fL (ref 78.0–100.0)
Platelets: 130 10*3/uL — ABNORMAL LOW (ref 150–400)
RBC: 4.09 MIL/uL (ref 3.87–5.11)
RDW: 13.8 % (ref 11.5–15.5)
WBC: 6.7 10*3/uL (ref 4.0–10.5)

## 2013-07-08 LAB — BASIC METABOLIC PANEL
BUN: 15 mg/dL (ref 6–23)
CHLORIDE: 109 meq/L (ref 96–112)
CO2: 22 mEq/L (ref 19–32)
CREATININE: 1.58 mg/dL — AB (ref 0.50–1.10)
Calcium: 8.2 mg/dL — ABNORMAL LOW (ref 8.4–10.5)
GFR calc non Af Amer: 30 mL/min — ABNORMAL LOW (ref >90)
GFR, EST AFRICAN AMERICAN: 35 mL/min — AB (ref >90)
Glucose, Bld: 81 mg/dL (ref 70–99)
Potassium: 4.3 mEq/L (ref 3.7–5.3)
Sodium: 145 mEq/L (ref 137–147)

## 2013-07-08 LAB — CLOSTRIDIUM DIFFICILE BY PCR: Toxigenic C. Difficile by PCR: NEGATIVE

## 2013-07-08 LAB — TROPONIN I

## 2013-07-08 LAB — TSH: TSH: 2.14 u[IU]/mL (ref 0.350–4.500)

## 2013-07-08 MED ORDER — METOPROLOL TARTRATE 25 MG PO TABS
25.0000 mg | ORAL_TABLET | Freq: Two times a day (BID) | ORAL | Status: DC
  Administered 2013-07-08 (×2): 25 mg via ORAL
  Filled 2013-07-08 (×3): qty 1

## 2013-07-08 MED ORDER — MAGNESIUM SULFATE IN D5W 10-5 MG/ML-% IV SOLN
1.0000 g | Freq: Once | INTRAVENOUS | Status: AC
  Administered 2013-07-08: 1 g via INTRAVENOUS
  Filled 2013-07-08: qty 100

## 2013-07-08 MED ORDER — ASPIRIN 81 MG PO CHEW
81.0000 mg | CHEWABLE_TABLET | Freq: Every day | ORAL | Status: DC
  Administered 2013-07-08 – 2013-07-09 (×2): 81 mg via ORAL
  Filled 2013-07-08 (×2): qty 1

## 2013-07-08 MED ORDER — DOCUSATE SODIUM 100 MG PO CAPS
100.0000 mg | ORAL_CAPSULE | Freq: Two times a day (BID) | ORAL | Status: DC
  Administered 2013-07-08 – 2013-07-09 (×3): 100 mg via ORAL
  Filled 2013-07-08 (×3): qty 1

## 2013-07-08 NOTE — Progress Notes (Addendum)
PATIENT DETAILS Name: Ashley Olsen Age: 80 y.o. Sex: female Date of Birth: March 12, 1932 Admit Date: 07/05/2013 Admitting Physician Rexene Alberts, MD [id number], Betsy Coder, MD   Subjective: Feels significantly better, no headache, no chest pain cough or shortness of breath, denies any abdominal pain except some bloating from gas.  Assessment/Plan:     Acute diverticulitis with microperforation. - Admitted, kept n.p.o., started on Cipro/Flagyl on 07/05/2013. Significantly better, leukocytosis has resolved, abdominal exam is benign and is now largely pain-free, she has tolerated clear liquids. Is being followed by general surgery evaluation (Dr. Romona Curls) we'll defer advancing diet to him.   Will place her on a stool softener at this time as she complains of some constipation.       HYPERTENSION, UNSPECIFIED - Blood pressure currently controlled without the use of antihypertensive medications, resume amlodipine when able.    Acute on chronic kidney failure stage III - Baseline creatinine around 1.72 years ago, with gentle hydration she is back to baseline    Mild thrombocytopenia -? Etiology,  continue to monitor. We'll use SCDs for DVT prophylaxis.     History of dyslipidemia - Resume statins on discharge.     History of symptomatic bradycardia - s/p PPM placement  - Felt palpitations later on 07/08/2013, EKG showed ventricular paced regular rhythm with a few PVCs, placed on a low-dose beta blocker, 1 g IV magnesium, EF few years ago is 65%, cycle troponin monitor on telemetry. Electrolytes were stable.      Disposition: Remain inpatient    DVT Prophylaxis:  SCD's and ambulation every shift.     Code Status: Full code   Family Communication Daughter at bedside  Procedures:  None  CONSULTS:  general surgery  Time spent 40 minutes-which includes 50% of the time with face-to-face with patient/ family and coordinating care related  to the above assessment and plan.    MEDICATIONS: Scheduled Meds: . ciprofloxacin  400 mg Intravenous Q24H  . famotidine (PEPCID) IV  20 mg Intravenous Q24H  . metronidazole  500 mg Intravenous Q8H   Continuous Infusions: . 0.9 % NaCl with KCl 20 mEq / L 65 mL/hr at 07/08/13 0101   PRN Meds:.acetaminophen, acetaminophen, albuterol, HYDROmorphone (DILAUDID) injection, ondansetron (ZOFRAN) IV, ondansetron  Antibiotics: Anti-infectives   Start     Dose/Rate Route Frequency Ordered Stop   07/06/13 1100  ciprofloxacin (CIPRO) IVPB 400 mg     400 mg 200 mL/hr over 60 Minutes Intravenous Every 24 hours 07/05/13 1633     07/05/13 1900  metroNIDAZOLE (FLAGYL) IVPB 500 mg     500 mg 100 mL/hr over 60 Minutes Intravenous Every 8 hours 07/05/13 1620     07/05/13 1130  ciprofloxacin (CIPRO) IVPB 400 mg     400 mg 200 mL/hr over 60 Minutes Intravenous  Once 07/05/13 1126 07/05/13 1241   07/05/13 1130  metroNIDAZOLE (FLAGYL) IVPB 500 mg     500 mg 100 mL/hr over 60 Minutes Intravenous  Once 07/05/13 1126 07/05/13 1241       PHYSICAL EXAM: Vital signs in last 24 hours: Filed Vitals:   07/07/13 0608 07/07/13 1334 07/07/13 2220 07/08/13 0549  BP: 102/61 111/58 145/66 122/75  Pulse: 67 67 60 62  Temp: 97.6 F (36.4 C) 98.2 F (36.8 C) 98.6 F (37 C) 97.8 F (36.6 C)  TempSrc: Oral Oral Oral Oral  Resp: 20 20 20 20   Height:      Weight: 79.4 kg (175  lb 0.7 oz)   79.1 kg (174 lb 6.1 oz)  SpO2: 99% 97% 100% 96%    Weight change: -0.3 kg (-10.6 oz) Filed Weights   07/06/13 0612 07/07/13 0608 07/08/13 0549  Weight: 78.9 kg (173 lb 15.1 oz) 79.4 kg (175 lb 0.7 oz) 79.1 kg (174 lb 6.1 oz)   Body mass index is 30.9 kg/(m^2).   Gen Exam: Awake and alert with clear speech.  Neck: Supple, No JVD.   Chest: B/L Clear.   CVS: S1 S2 Regular, no murmurs.  Abdomen: soft, BS +, nontender on exam, non distended.  Extremities: no edema, lower extremities warm to touch. Neurologic: Non  Focal.   Skin: No Rash.   Wounds: N/A.   Intake/Output from previous day:  Intake/Output Summary (Last 24 hours) at 07/08/13 0835 Last data filed at 07/07/13 1745  Gross per 24 hour  Intake   1105 ml  Output      0 ml  Net   1105 ml     LAB RESULTS: CBC  Recent Labs Lab 07/05/13 1041 07/06/13 0546 07/07/13 0512 07/08/13 0508  WBC 12.1* 7.0 7.4 6.7  HGB 14.5 11.9* 11.5* 12.2  HCT 44.3 36.4 35.0* 37.6  PLT 137* 111* 113* 130*  MCV 91.9 92.6 92.8 91.9  MCH 30.1 30.3 30.5 29.8  MCHC 32.7 32.7 32.9 32.4  RDW 13.8 13.8 13.8 13.8  LYMPHSABS 1.6  --   --   --   MONOABS 0.9  --   --   --   EOSABS 0.3  --   --   --   BASOSABS 0.0  --   --   --     Chemistries   Recent Labs Lab 07/05/13 1041 07/06/13 0546 07/07/13 0512 07/08/13 0508  NA 145 147 145 145  K 3.5* 4.2 4.2 4.3  CL 103 110 110 109  CO2 28 26 23 22   GLUCOSE 96 89 78 81  BUN 33* 26* 20 15  CREATININE 2.21* 1.95* 1.78* 1.58*  CALCIUM 9.4 8.2* 7.7* 8.2*  MG  --  1.9  --   --     CBG: No results found for this basename: GLUCAP,  in the last 168 hours  GFR Estimated Creatinine Clearance: 28.8 ml/min (by C-G formula based on Cr of 1.58).  Coagulation profile No results found for this basename: INR, PROTIME,  in the last 168 hours  Cardiac Enzymes No results found for this basename: CK, CKMB, TROPONINI, MYOGLOBIN,  in the last 168 hours  No components found with this basename: POCBNP,  No results found for this basename: DDIMER,  in the last 72 hours No results found for this basename: HGBA1C,  in the last 72 hours No results found for this basename: CHOL, HDL, LDLCALC, TRIG, CHOLHDL, LDLDIRECT,  in the last 72 hours  Recent Labs  07/05/13 2139  TSH 1.950    Recent Labs  07/06/13 0546  VITAMINB12 137*   No results found for this basename: LIPASE, AMYLASE,  in the last 72 hours  Urine Studies No results found for this basename: UACOL, UAPR, USPG, UPH, UTP, UGL, UKET, UBIL, UHGB, UNIT,  UROB, ULEU, UEPI, UWBC, URBC, UBAC, CAST, CRYS, UCOM, BILUA,  in the last 72 hours  MICROBIOLOGY: No results found for this or any previous visit (from the past 240 hour(s)).  RADIOLOGY STUDIES/RESULTS: Ct Abdomen Pelvis Wo Contrast  07/05/2013   CLINICAL DATA:  Abdominal pain, unspecified site/CKD (chronic kidney disease), unspecified stage/Hematuria/LLQ pain  EXAM: CT  ABDOMEN AND PELVIS WITHOUT CONTRAST  TECHNIQUE: Multidetector CT imaging of the abdomen and pelvis was performed following the standard protocol without IV contrast.  COMPARISON:  CT abdomen pelvis 06/17/2010, abdominal CT 11/27/2007  FINDINGS: Linear areas of increased density within the lung bases scar versus atelectasis.  Stable multiple low attenuating foci within the right and left lobes of liver. Stable 2.4 cm low attenuating nodule anterior periphery of the left lobe of the liver. Liver is otherwise unremarkable. Stable right adrenal adenoma. The left adrenal is unremarkable.  Stable benign-appearing cysts within the right kidney. The kidneys are atrophic. There is no evidence of obstructive uropathy nor renal calculus disease.  Atherosclerotic calcification aorta. No evidence of abdominal aortic aneurysm.  The pancreas and spleen are unremarkable.  Transverse colon (images 32 through 42 series 2) there is diffuse inflammatory change in the surrounding mesenteric fat, mild bowel wall thickening, and small punctate foci of air within the mesenteric. Foci of diverticulosis are appreciated involving dissection of bowel. No drainable loculated fluid collections are appreciated. Further areas of diverticulosis are appreciated within the descending colon and to a lesser extent sigmoid colon.  There is no evidence of bowel obstruction.  Within the limitations of a noncontrast CT, no further evidence of abdominal or pelvic masses, loculated fluid collections, nor adenopathy.  Very small fat containing umbilical hernia is appreciated. A fat  containing right inguinal hernia is appreciated. No evidence of an inguinal hernia on the left.  No aggressive appearing osseous lesions.  IMPRESSION: Diverticulitis with microperforation involving the proximal transverse colon.  Stable benign appearing low attenuating foci within the liver.  Stable Bosniak type 1 cysts right kidney  Stable right adrenal adenoma   Electronically Signed   By: Margaree Mackintosh M.D.   On: 07/05/2013 09:38    Thurnell Lose, MD  [facility] Pager:[phone]  If 7PM-7AM, please contact night-coverage [email or web address] Password [email or web address] 07/08/2013, 8:35 AM   LOS: 3 days   **Disclaimer: This note may have been dictated with voice recognition software. Similar sounding words can inadvertently be transcribed and this note may contain transcription errors which may not have been corrected upon publication of note.**

## 2013-07-08 NOTE — Progress Notes (Signed)
Pt ambulated to nurses station after refusal. She tolerated it well with steady gait.

## 2013-07-08 NOTE — Progress Notes (Signed)
ANTIBIOTIC CONSULT NOTE  Pharmacy Consult for Cipro Indication: Intra-abdominal infection  Allergies  Allergen Reactions  . Codeine Nausea And Vomiting  . Sulfonamide Derivatives Other (See Comments)    Tongue rash    Patient Measurements: Height: 5\' 3"  (160 cm) Weight: 174 lb 6.1 oz (79.1 kg) IBW/kg (Calculated) : 52.4 Adjusted Body Weight:   Vital Signs: Temp: 97.8 F (36.6 C) (06/28 0549) Temp src: Oral (06/28 0549) BP: 122/75 mmHg (06/28 0549) Pulse Rate: 62 (06/28 0549) Intake/Output from previous day: 06/27 0701 - 06/28 0700 In: 1425 [P.O.:1125; IV Piggyback:300] Out: -  Intake/Output from this shift:    Labs:  Recent Labs  07/06/13 0546 07/07/13 0512 07/08/13 0508  WBC 7.0 7.4 6.7  HGB 11.9* 11.5* 12.2  PLT 111* 113* 130*  CREATININE 1.95* 1.78* 1.58*   Estimated Creatinine Clearance: 28.8 ml/min (by C-G formula based on Cr of 1.58). No results found for this basename: VANCOTROUGH, Corlis Leak, VANCORANDOM, [place], GENTPEAK, GENTRANDOM, TOBRATROUGH, TOBRAPEAK, TOBRARND, AMIKACINPEAK, AMIKACINTROU, AMIKACIN,  in the last 72 hours   Microbiology: Recent Results (from the past 720 hour(s))  CLOSTRIDIUM DIFFICILE BY PCR     Status: None   Collection Time    07/08/13 11:47 AM      Result Value Ref Range Status   C difficile by pcr NEGATIVE  NEGATIVE Final    Medical History: Past Medical History  Diagnosis Date  . Hypertension   . Hyperlipidemia   . Pacemaker   . Pain in joint, shoulder region   . Blood in urine   . History of blood clots   . CKD (chronic kidney disease)     sees Dr Yvonne Kendall insufficiency  . H/O hiatal hernia   . Arthritis   . Osteoporosis   . Sinoatrial node dysfunction   . Diverticulitis Recurrent    2007-status post partial colectomy    Medications:  Scheduled:  . aspirin  81 mg Oral Daily  . ciprofloxacin  400 mg Intravenous Q24H  . docusate sodium  100 mg Oral BID  . famotidine (PEPCID) IV  20 mg Intravenous  Q24H  . magnesium sulfate 1 - 4 g bolus IVPB  1 g Intravenous Once  . metoprolol tartrate  25 mg Oral BID  . metronidazole  500 mg Intravenous Q8H   Assessment: Known history of diverticulitis, renal function improving but does not warrant does adjustment at this time.  Also on IV Flagyl.  Goal of Therapy:  Eradicate infection  Plan:  Continue Cipro 400 mg IV every 24 hours Monitor renal function Labs per protocol F/U LOT  Pricilla Larsson 07/08/2013,12:57 PM

## 2013-07-08 NOTE — Progress Notes (Signed)
Patient ambulated in hallway this am without difficulty.

## 2013-07-08 NOTE — Progress Notes (Signed)
Patient reassessed says"I no longer feel the fluttering of my heart."Family at bedside,no c/o pain or discomfort noted.Dr Candiss Norse aware of EKG results.Will continue to monitor patient.

## 2013-07-08 NOTE — Progress Notes (Signed)
Patient c/o chest discomfort says"I feel my heart fluttering,it started yesterday,but it went away,now it's back,and continues". B/P 133/78,HR 60,resp 20,temp 97.9.Dr Candiss Norse notified.Patient placed on telemetry.Orders received,and given,will continue to monitor patient.

## 2013-07-09 ENCOUNTER — Encounter (HOSPITAL_COMMUNITY): Payer: Self-pay | Admitting: Internal Medicine

## 2013-07-09 DIAGNOSIS — K5732 Diverticulitis of large intestine without perforation or abscess without bleeding: Secondary | ICD-10-CM | POA: Diagnosis not present

## 2013-07-09 DIAGNOSIS — E538 Deficiency of other specified B group vitamins: Secondary | ICD-10-CM | POA: Diagnosis not present

## 2013-07-09 DIAGNOSIS — N179 Acute kidney failure, unspecified: Secondary | ICD-10-CM | POA: Diagnosis not present

## 2013-07-09 LAB — BASIC METABOLIC PANEL
BUN: 11 mg/dL (ref 6–23)
CALCIUM: 8.3 mg/dL — AB (ref 8.4–10.5)
CO2: 25 mEq/L (ref 19–32)
Chloride: 109 mEq/L (ref 96–112)
Creatinine, Ser: 1.54 mg/dL — ABNORMAL HIGH (ref 0.50–1.10)
GFR, EST AFRICAN AMERICAN: 36 mL/min — AB (ref >90)
GFR, EST NON AFRICAN AMERICAN: 31 mL/min — AB (ref >90)
GLUCOSE: 87 mg/dL (ref 70–99)
Potassium: 4.6 mEq/L (ref 3.7–5.3)
SODIUM: 143 meq/L (ref 137–147)

## 2013-07-09 LAB — TROPONIN I: Troponin I: <0.3 ng/mL (ref <0.30)

## 2013-07-09 LAB — MAGNESIUM: Magnesium: 1.8 mg/dL (ref 1.5–2.5)

## 2013-07-09 MED ORDER — CIPROFLOXACIN HCL 500 MG PO TABS: 500.0000 mg | ORAL_TABLET | Freq: Every day | ORAL | Status: DC

## 2013-07-09 MED ORDER — ONDANSETRON HCL 4 MG PO TABS: 4.0000 mg | ORAL_TABLET | Freq: Four times a day (QID) | ORAL | Status: DC | PRN

## 2013-07-09 MED ORDER — CIPROFLOXACIN HCL 250 MG PO TABS: 500.0000 mg | ORAL_TABLET | Freq: Every day | ORAL | Status: DC

## 2013-07-09 MED ORDER — METRONIDAZOLE 500 MG PO TABS
500.0000 mg | ORAL_TABLET | Freq: Three times a day (TID) | ORAL | Status: DC
  Administered 2013-07-09: 500 mg via ORAL
  Filled 2013-07-09: qty 1

## 2013-07-09 MED ORDER — CYANOCOBALAMIN 1000 MCG/ML IJ SOLN
1000.0000 ug | Freq: Every day | INTRAMUSCULAR | Status: DC
  Administered 2013-07-09: 1000 ug via INTRAMUSCULAR
  Filled 2013-07-09: qty 1

## 2013-07-09 MED ORDER — CIPROFLOXACIN HCL 250 MG PO TABS: 250.0000 mg | ORAL_TABLET | Freq: Two times a day (BID) | ORAL | Status: DC

## 2013-07-09 MED ORDER — METRONIDAZOLE 500 MG PO TABS: 500.0000 mg | ORAL_TABLET | Freq: Three times a day (TID) | ORAL | Status: DC

## 2013-07-09 NOTE — Discharge Summary (Signed)
The patient was seen and examined. Her vital signs, laboratory studies, in chart were reviewed. She was discussed with nurse practitioner, Ms. Renard Hamper. Agree with her assessment and plan.  Discharge diagnoses: 1. Acute diverticulitis with microperforation. Clinically improved on medical therapy alone. We'll continue metronidazole and Cipro for 7 more days for a total of 10 days of treatment. The patient was instructed to follow a full liquid low fat/bland diet for the next several days. She will followup with general surgeon Dr. Romona Curls. 2. Newly diagnosed vitamin B 12 deficiency. Her vitamin B12 was low at 137. She was given 1000 mcg of IM vitamin B12. She will likely need further supplementation with monthly vitamin B12 injections. Treatment/management will be deferred to her PCP. 3. Thrombocytopenia. Her platelet count ranged from 111-137. For evaluation, a vitamin B12 level and TSH were ordered. As stated above, her vitamin B12 level was low, but her TSH was within normal limits. Recommend further outpatient monitoring. 4. Acute renal failure superimposed on stage III chronic kidney disease. This was thought to be secondary to prerenal azotemia. Her creatinine returned to baseline at 1.5. 5. Hypertension. Her blood pressure remained controlled on amlodipine. 6. History of bradycardia, status post PPM-[employer]. Her heart rate was within normal limits with occasional heart rate in the upper 50s. She was asymptomatic.

## 2013-07-09 NOTE — Progress Notes (Signed)
Patient discharged with instructions, prescription, and care notes.  Verbalized understanding via teach back.  IV was removed and the site was WNL. Patient voiced no further complaints or concerns at the time of discharge.  Appointments scheduled per instructions.  Patient left the floor via w/c with staff and family in stable condition.  Appointments were made prior to discharge.  IV removed and the site was WNL AEB no redness, c/o pain or swelling.

## 2013-07-09 NOTE — Progress Notes (Signed)
ANTIBIOTIC CONSULT NOTE  Pharmacy Consult for Cipro Indication: Intra-abdominal infection  Allergies  Allergen Reactions  . Codeine Nausea And Vomiting  . Sulfonamide Derivatives Other (See Comments)    Tongue rash    Patient Measurements: Height: 5\' 3"  (160 cm) Weight: 173 lb 15.1 oz (78.9 kg) IBW/kg (Calculated) : 52.4  Vital Signs: Temp: 97.1 F (36.2 C) (06/29 0515) Temp src: Oral (06/29 0515) BP: 97/64 mmHg (06/29 1026) Pulse Rate: 61 (06/29 1026) Intake/Output from previous day: 06/28 0701 - 06/29 0700 In: 3936.2 [P.O.:120; I.V.:3816.2] Out: -  Intake/Output from this shift:    Labs:  Recent Labs  07/07/13 0512 07/08/13 0508 07/09/13 0524  WBC 7.4 6.7  --   HGB 11.5* 12.2  --   PLT 113* 130*  --   CREATININE 1.78* 1.58* 1.54*   Estimated Creatinine Clearance: 29.5 ml/min (by C-G formula based on Cr of 1.54). No results found for this basename: VANCOTROUGH, Corlis Leak, VANCORANDOM, [place], GENTPEAK, GENTRANDOM, TOBRATROUGH, TOBRAPEAK, TOBRARND, AMIKACINPEAK, AMIKACINTROU, AMIKACIN,  in the last 72 hours   Microbiology: Recent Results (from the past 720 hour(s))  CLOSTRIDIUM DIFFICILE BY PCR     Status: None   Collection Time    07/08/13 11:47 AM      Result Value Ref Range Status   C difficile by pcr NEGATIVE  NEGATIVE Final    Medical History: Past Medical History  Diagnosis Date  . Hypertension   . Hyperlipidemia   . Pacemaker   . Pain in joint, shoulder region   . Blood in urine   . History of blood clots   . CKD (chronic kidney disease)     sees Dr Yvonne Kendall insufficiency  . H/O hiatal hernia   . Arthritis   . Osteoporosis   . Sinoatrial node dysfunction   . Diverticulitis Recurrent    2007-status post partial colectomy  . Vitamin B12 deficiency 07/09/2013    Medications:  Scheduled:  . aspirin  81 mg Oral Daily  . ciprofloxacin  250 mg Oral BID  . cyanocobalamin  1,000 mcg Intramuscular Daily  . docusate sodium  100 mg Oral  BID  . famotidine (PEPCID) IV  20 mg Intravenous Q24H  . metoprolol tartrate  25 mg Oral BID  . metroNIDAZOLE  500 mg Oral 3 times per day   Assessment: 80 yo F with acute diverticulitis with perforation.  Her diet is being advanced.  Cipro/Flagyl are being switched to PO. Renal function improving but still<69ml/min.  Goal of Therapy:  Eradicate infection  Plan:  Change Cipro 250mg  PO every 12 hours Monitor renal function  Biagio Borg 07/09/2013,11:58 AM

## 2013-07-09 NOTE — Progress Notes (Signed)
Notified Dyanne Carrel, NP of the patient c/o nausea after her IV CIPRO.  She ate breakfast this am and had no complaints after her breakfast.  She ate approximately 12 oz grits and drank almost a carton of milk.  I will continue to monitor her and see how she does for lunch.

## 2013-07-09 NOTE — Progress Notes (Signed)
Notified Dr. Romona Curls for the patients request to eat.   She stated that she had not had pain since admission and feels as if she can eat.  Dr. Romona Curls stated that the patient could be advanced to full liquids only.  He also went on to say that if the patient is discharged she should remain on a full liquid diet for a while because she need to advance the diet slowly.  I voiced that I would notify Dr. Caryn Section or Dyanne Carrel NP.

## 2013-07-09 NOTE — Discharge Summary (Signed)
Physician Discharge Summary  [name] [id number] DOB: [birth year] DOA: 07/05/2013  PCP: Ashley Kilts, MD  Admit date: 07/05/2013 Discharge date: 07/09/2013  Time spent: 40 minutes  Recommendations for Outpatient Follow-up:  1. Follow up with Dr. Hilma Olsen on 07/17/13 for evaluation of symptoms and slow advancement of diet.  Follow B12 deficiency and likely need for monthly injections. Recommend BMET for evaluation of kidney function 2. Dr. Romona Olsen general surgery in 1-2 weeks for evaluation of diverticulitis with small perforation.   Discharge Diagnoses:  Principal Problem:   Acute diverticulitis Active Problems:   HYPERTENSION, UNSPECIFIED   PPM-St.Jude   Perforation of colon   CKD (chronic kidney disease) stage 3, GFR 30-59 ml/min   Acute renal failure   Hypokalemia   Thrombocytopenia   Vitamin B12 deficiency   Discharge Condition: stable. Tolerating soft diet  Diet recommendation: soft diet for 3-4 days advancing slowly.   Filed Weights   07/07/13 0608 07/08/13 0549 07/09/13 0600  Weight: 79.4 kg (175 lb 0.7 oz) 79.1 kg (174 lb 6.1 oz) 78.9 kg (173 lb 15.1 oz)    History of present illness:  Ashley Olsen is a 80 y.o. female with a history of hypertension, chronic kidney disease, and recurrent diverticulitis with a prior complicated diverticulitis requiring a partial colectomy in 2007, who presented to the hospital on 07/05/13 with a chief complaint of abdominal pain. She was actually seen by her primary care provider who referred her to radiology for an outpatient CT scan. When the CT revealed diverticulitis with microperforation, she was transferred to the ED. Her abdominal pain started approximately 2 nights prior. At that time, it was 10 over 10 in intensity. She took tramadol which relieved the pain. From that time she had intermittent mild to moderate abdominal pain, located primarily on the left upper and left lower quadrants. She had several loose  stools, but no black tarry stools and no obvious bright red blood in her stools. However, she did wipe a little blood from her rectum/anus once because "I was raw". She had nausea, but no vomiting. She denied pain with urination. Reportedly she had a urinalysis performed in the office of her primary care physician and it revealed hematuria which is chronic, but no evidence of infection. She denied fever or chills. She denied chest pain, but occasionally had shortness of breath.  In the ED, she was afebrile and hemodynamically stable. However, her initial blood pressure was slightly low, but improved with hydration. Her white blood cell count was elevated at 12.1. Her serum potassium is low at 3.5. Her BUN was elevated at 33 and her creatinine was elevated at 2.21. CT of her abdomen and pelvis revealed diverticulitis with microperforation involving the proximal transverse colon, stable right adrenal adenoma, I cyst right kidney, and stable benign appearing foci within the liver.   Hospital Course:  Acute diverticulitis with microperforation.  - Patient with hx of same s/p L colectomy 2007.  Admitted to tele and kept n.p.o. And provided Cipro/Flagyl. She quickly improved. Evaluated by Dr. Romona Olsen with general surgery who opined no surgery needed at this time. Recommended slow advancement of diet. Diet  advanced diet to clear liquids which she tolerated well and then full liquids/soft bland food. At time of discharge leukocytosis has resolved, abdominal exam is benign and she is tolerating full liquid diet. She will be discharged with 7 more days of flagyl and cipro to complete 10 day course. Follow up with PCP for evaluation of symptoms 07/17/13.  Also recommend follow up with Dr. Romona Olsen 1-2 weeks.  HYPERTENSION, UNSPECIFIED  - Blood pressure remained controlled.  Continue amlodipine.  Acute on chronic kidney failure stage III  - Baseline creatinine around 1.7 2 years ago. Provided with gentle hydration and  at discharge creatinine 1.54 close to baseline  Mild thrombocytopenia  - Likel related to Vit B12 deficiency. She received 1022mcg cyanocobalamin. Platelets trending up at discharge. Recommend OP follow up. No s/sx bleeding during this hospitalization. History of dyslipidemia  - Continue statins on discharge.  History of symptomatic bradycardia  - s/p PPM placement. No episodes during this hospitalziation Vitamin B12 deficinecy Provided with 1036mcg cyanocobalamin. Will likely need monthly injections. Recommend close OP follow up for management  Procedures:  none  Consultations:  Dr. Romona Olsen general surgery  Discharge Exam: Filed Vitals:   07/09/13 1410  BP: 133/71  Pulse: 67  Temp: 96.9 F (36.1 C)  Resp: 18    General: well nourished NAD Cardiovascular: S1 and S2 RRR No MGR No LE edema Respiratory: normal effort BS clear bilaterally with no wheeze or crackles. Abdomen: obese and soft, +BS in all 4 quadrants. Non-tender to palpation  Discharge Instructions You were cared for by a hospitalist during your hospital stay. If you have any questions about your discharge medications or the care you received while you were in the hospital after you are discharged, you can call the unit and asked to speak with the hospitalist on call if the hospitalist that took care of you is not available. Once you are discharged, your primary care physician will handle any further medical issues. Please note that NO REFILLS for any discharge medications will be authorized once you are discharged, as it is imperative that you return to your primary care physician (or establish a relationship with a primary care physician if you do not have one) for your aftercare needs so that they can reassess your need for medications and monitor your lab values.     Medication List         amLODipine 10 MG tablet  Commonly known as:  NORVASC  Take 10 mg by mouth daily.     ciprofloxacin 500 MG tablet   Commonly known as:  CIPRO  Take 1 tablet (500 mg total) by mouth daily.     GNP CALCIUM PLUS 600 +D PO  Take 1 tablet by mouth 2 (two) times daily.     metroNIDAZOLE 500 MG tablet  Commonly known as:  FLAGYL  Take 1 tablet (500 mg total) by mouth every 8 (eight) hours.     ondansetron 4 MG tablet  Commonly known as:  ZOFRAN  Take 1 tablet (4 mg total) by mouth every 6 (six) hours as needed for nausea.     simvastatin 20 MG tablet  Commonly known as:  ZOCOR  Take 20 mg by mouth at bedtime.     traMADol-acetaminophen 37.5-325 MG per tablet  Commonly known as:  ULTRACET  Take 1 tablet by mouth as needed for moderate pain.       Allergies  Allergen Reactions  . Codeine Nausea And Vomiting  . Sulfonamide Derivatives Other (See Comments)    Tongue rash   Follow-up Information   Follow up with Ashley Kilts, MD. Schedule an appointment as soon as possible for a visit on 07/17/2013. (follow up with on symptoms as well as BP and HR , vitamin B12 injections. appointment at 2pm)    Specialty:  Family Medicine   Contact  information:   [address] [id number] [phone]       Follow up with Scherry Ran, MD In 1 week. (follow up on symptoms. )    Specialty:  General Surgery   Contact information:   [address] [place] [address] [phone]        The results of significant diagnostics from this hospitalization (including imaging, microbiology, ancillary and laboratory) are listed below for reference.    Significant Diagnostic Studies: Ct Abdomen Pelvis Wo Contrast  07/05/2013   CLINICAL DATA:  Abdominal pain, unspecified site/CKD (chronic kidney disease), unspecified stage/Hematuria/LLQ pain  EXAM: CT ABDOMEN AND PELVIS WITHOUT CONTRAST  TECHNIQUE: Multidetector CT imaging of the abdomen and pelvis was performed following the standard protocol without IV contrast.  COMPARISON:  CT abdomen pelvis 06/17/2010, abdominal CT 11/27/2007   FINDINGS: Linear areas of increased density within the lung bases scar versus atelectasis.  Stable multiple low attenuating foci within the right and left lobes of liver. Stable 2.4 cm low attenuating nodule anterior periphery of the left lobe of the liver. Liver is otherwise unremarkable. Stable right adrenal adenoma. The left adrenal is unremarkable.  Stable benign-appearing cysts within the right kidney. The kidneys are atrophic. There is no evidence of obstructive uropathy nor renal calculus disease.  Atherosclerotic calcification aorta. No evidence of abdominal aortic aneurysm.  The pancreas and spleen are unremarkable.  Transverse colon (images 32 through 42 series 2) there is diffuse inflammatory change in the surrounding mesenteric fat, mild bowel wall thickening, and small punctate foci of air within the mesenteric. Foci of diverticulosis are appreciated involving dissection of bowel. No drainable loculated fluid collections are appreciated. Further areas of diverticulosis are appreciated within the descending colon and to a lesser extent sigmoid colon.  There is no evidence of bowel obstruction.  Within the limitations of a noncontrast CT, no further evidence of abdominal or pelvic masses, loculated fluid collections, nor adenopathy.  Very small fat containing umbilical hernia is appreciated. A fat containing right inguinal hernia is appreciated. No evidence of an inguinal hernia on the left.  No aggressive appearing osseous lesions.  IMPRESSION: Diverticulitis with microperforation involving the proximal transverse colon.  Stable benign appearing low attenuating foci within the liver.  Stable Bosniak type 1 cysts right kidney  Stable right adrenal adenoma   Electronically Signed   By: Margaree Mackintosh M.D.   On: 07/05/2013 09:38    Microbiology: Recent Results (from the past 240 hour(s))  CLOSTRIDIUM DIFFICILE BY PCR     Status: None   Collection Time    07/08/13 11:47 AM      Result Value Ref Range  Status   C difficile by pcr NEGATIVE  NEGATIVE Final     Labs: Basic Metabolic Panel:  Recent Labs Lab 07/05/13 1041 07/06/13 0546 07/07/13 0512 07/08/13 0508 07/09/13 0524  NA 145 147 145 145 143  K 3.5* 4.2 4.2 4.3 4.6  CL 103 110 110 109 109  CO2 28 26 23 22 25   GLUCOSE 96 89 78 81 87  BUN 33* 26* 20 15 11   CREATININE 2.21* 1.95* 1.78* 1.58* 1.54*  CALCIUM 9.4 8.2* 7.7* 8.2* 8.3*  MG  --  1.9  --   --  1.8   Liver Function Tests:  Recent Labs Lab 07/06/13 0546  AST 12  ALT 9  ALKPHOS 56  BILITOT 0.4  PROT 5.6*  ALBUMIN 2.9*   No results found for this basename: LIPASE, AMYLASE,  in the  last 168 hours No results found for this basename: AMMONIA,  in the last 168 hours CBC:  Recent Labs Lab 07/05/13 1041 07/06/13 0546 07/07/13 0512 07/08/13 0508  WBC 12.1* 7.0 7.4 6.7  NEUTROABS 9.2*  --   --   --   HGB 14.5 11.9* 11.5* 12.2  HCT 44.3 36.4 35.0* 37.6  MCV 91.9 92.6 92.8 91.9  PLT 137* 111* 113* 130*   Cardiac Enzymes:  Recent Labs Lab 07/08/13 1146 07/08/13 1728 07/08/13 2342  TROPONINI <0.30 <0.30 <0.30   BNP: BNP (last 3 results) No results found for this basename: PROBNP,  in the last 8760 hours CBG: No results found for this basename: GLUCAP,  in the last 168 hours     Signed:  Radene Gunning  [facility] 07/09/2013, 3:06 PM

## 2013-07-09 NOTE — Progress Notes (Signed)
She ate her lunch she has about 75% of the meal and stated that she feels fine.  Especially after getting the nausea medication.   She states she thinks the nausea is from her being weak.

## 2013-07-10 NOTE — Progress Notes (Signed)
UR chart review completed.  

## 2013-07-17 DIAGNOSIS — N183 Chronic kidney disease, stage 3 unspecified: Secondary | ICD-10-CM | POA: Diagnosis not present

## 2013-07-17 DIAGNOSIS — E538 Deficiency of other specified B group vitamins: Secondary | ICD-10-CM | POA: Diagnosis not present

## 2013-07-17 DIAGNOSIS — K631 Perforation of intestine (nontraumatic): Secondary | ICD-10-CM | POA: Diagnosis not present

## 2013-07-17 DIAGNOSIS — Z6830 Body mass index (BMI) 30.0-30.9, adult: Secondary | ICD-10-CM | POA: Diagnosis not present

## 2013-07-17 DIAGNOSIS — K5732 Diverticulitis of large intestine without perforation or abscess without bleeding: Secondary | ICD-10-CM | POA: Diagnosis not present

## 2013-07-19 ENCOUNTER — Encounter (INDEPENDENT_AMBULATORY_CARE_PROVIDER_SITE_OTHER): Payer: Self-pay | Admitting: Surgery

## 2013-07-23 ENCOUNTER — Telehealth (HOSPITAL_COMMUNITY): Payer: Self-pay | Admitting: Dietician

## 2013-07-23 NOTE — Telephone Encounter (Signed)
Received voicemail left by Ashley Olsen on 07/19/13. Called back at 1400. She reports that she was discharged on a full liquid diet and was told to advance to a soft diet by her doctor, but is unsure what to eat.  Educated Ashley Olsen on low fiber diet. Provided examples of foods allowed on diet. Encouraged her to avoid high fiber foods and raw fruits and vegetables at this time. Sent "Low Fiber Nutrition Therapy" handout to pt Ashley Olsen via [place] [employer].

## 2013-07-24 DIAGNOSIS — K631 Perforation of intestine (nontraumatic): Secondary | ICD-10-CM | POA: Diagnosis not present

## 2013-07-24 DIAGNOSIS — K5732 Diverticulitis of large intestine without perforation or abscess without bleeding: Secondary | ICD-10-CM | POA: Diagnosis not present

## 2013-08-03 ENCOUNTER — Ambulatory Visit (INDEPENDENT_AMBULATORY_CARE_PROVIDER_SITE_OTHER): Payer: Medicare Other | Admitting: Surgery

## 2013-08-03 ENCOUNTER — Encounter (INDEPENDENT_AMBULATORY_CARE_PROVIDER_SITE_OTHER): Payer: Self-pay | Admitting: Surgery

## 2013-08-03 VITALS — BP 132/86 | HR 68 | Temp 98.2°F | Resp 14 | Ht 63.0 in | Wt 163.8 lb

## 2013-08-03 DIAGNOSIS — K5792 Diverticulitis of intestine, part unspecified, without perforation or abscess without bleeding: Secondary | ICD-10-CM

## 2013-08-03 DIAGNOSIS — K5732 Diverticulitis of large intestine without perforation or abscess without bleeding: Secondary | ICD-10-CM | POA: Diagnosis not present
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
## 2013-08-07 ENCOUNTER — Encounter: Payer: Self-pay | Admitting: *Deleted

## 2013-09-05 ENCOUNTER — Telehealth: Payer: Self-pay

## 2013-09-05 NOTE — Telephone Encounter (Signed)
Pt called and had questions about her referral for colonoscopy. She wanted to know how quickly Dr. Oneida Alar will do it. I told her she will discuss with her at appt tomorrow.

## 2013-09-06 ENCOUNTER — Encounter (INDEPENDENT_AMBULATORY_CARE_PROVIDER_SITE_OTHER): Payer: Self-pay

## 2013-09-06 ENCOUNTER — Other Ambulatory Visit: Payer: Self-pay | Admitting: Gastroenterology

## 2013-09-06 ENCOUNTER — Encounter: Payer: Self-pay | Admitting: Gastroenterology

## 2013-09-06 ENCOUNTER — Ambulatory Visit (INDEPENDENT_AMBULATORY_CARE_PROVIDER_SITE_OTHER): Payer: Medicare Other | Admitting: Gastroenterology

## 2013-09-06 VITALS — BP 131/75 | HR 66 | Temp 97.6°F | Ht 63.0 in | Wt 167.8 lb

## 2013-09-06 DIAGNOSIS — K5732 Diverticulitis of large intestine without perforation or abscess without bleeding: Secondary | ICD-10-CM

## 2013-09-06 DIAGNOSIS — K5720 Diverticulitis of large intestine with perforation and abscess without bleeding: Secondary | ICD-10-CM
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
## 2013-09-06 NOTE — Assessment & Plan Note (Signed)
Recurrent AND COMPLICATED BY MICROPERFORATION. LAST TCS 2007. SX RESOLVED.  TCS AUG 28 WITH MIRALAX PREP-DISCUSSED PROCEDURE, BENEFITS, & RISKS: < 1% chance of medication reaction, bleeding, perforation, or rupture of spleen/liver.  CLEAR LIQUIDS TODAY DULCOLAX AT 3P AND 5 P FOLLOW UP TBS AFTER TCS

## 2013-09-06 NOTE — Patient Instructions (Signed)
CLEAR LIQUIDS TODAY. SEE INFO BELOW.  DULCOLAX 10 MG AT 3P AND 5 P  MIRALAX PREP: TAKE MIRALAX 17 GMS PO Q1H FROM 12N TO 6PM. DRINK 1 CUP OF LIQUID 30 MINUTES AFTER EACH DOSE: 1230P TO 630PM.   YOU MAY HAVE A CLEAR LIQUID BREAKFAST ON FRI MORNING. NOTHING TO EAT OR DRINK AFTER 9 AM.  COLONOSCOPY TOMORROW AT AUG 28.     CLEAR LIQUID DIET  Nutrition Facts A clear liquid diet is not adequate in calories and nutrients. It should not be used for more than five days unless high-protein gelatin or other low-residue supplements are added. Special Considerations  1. Limitations The physician may limit certain liquids, depending on the patient's condition, or the surgery or test being performed. Therefore, individual instructions should be strictly followed. 2. What is a clear liquid? A good rule-of-thumb is anything you can see through. For example, apple juice is a clear liquid; milk is not. If unsure, check with the physician or registered dietition. 3. After surgery and fasting Should persistent abdominal cramps or discomfort occur with a clear liquid diet, the patient should notify the physician, nurse, or dietitian at once. 4. Preparing for a medical test It is important that the clear liquid diet be followed exactly. Remember that the value of the examination will depend on getting a thoroughly clean digestive tract.   Food Groups  Group Recommend Avoid  Milk & milk products none all  Vegetables none all  Fruits fruit juices without pulp nectars; all fresh, canned, and frozen fruits  Breads & grains none all  Meat or meat substitutes none all  Fats & oils none all  Sweets & desserts gelatin, fruit ice, popsicle without pulp, clear hard candy all others  Beverages coffee; tea; soft drinks; water; lactose-free, low residue supplements if approved by physiciancoffee; tea; soft drinks; water; lactose-free, low residue supplements if approved by physician all others  Soups bouillon,  consomm fat free broth all others   Sample Menu  Breakfast Lunch Dinner  strained fruit juice 1 cup  gelatin 1 cup  hot tea with sugar & lemon consomm 3/4 cup  strained fruit juice 1 cup  fruit ice 1/2 cup  gelatin 1/2 cup  hot tea with sugar & lemon consomm 3/4 cup  strained fruit juice 1 cup  fruit ice 1/2 cup  gelatin 1/2 cup  hot tea with sugar & lemon   This Sample Diet Provides the Following  Calories 600 Fat virtually none  Protein 6 gm Sodium 1500 mg  Carbohydrates 209 gm Potassium 1440 mg

## 2013-09-07 ENCOUNTER — Encounter (HOSPITAL_COMMUNITY)
Admission: RE | Disposition: A | Discharge status: Home / Self Care | Payer: Self-pay | Source: Ambulatory Visit | Attending: Gastroenterology

## 2013-09-07 ENCOUNTER — Encounter (INDEPENDENT_AMBULATORY_CARE_PROVIDER_SITE_OTHER): Payer: Medicare Other | Admitting: Surgery

## 2013-09-07 ENCOUNTER — Ambulatory Visit (HOSPITAL_COMMUNITY)
Admission: RE | Admit: 2013-09-07 | Discharge: 2013-09-07 | Disposition: A | Discharge status: Home / Self Care | Payer: Medicare Other | Source: Ambulatory Visit | Attending: Gastroenterology | Admitting: Gastroenterology

## 2013-09-07 ENCOUNTER — Encounter (HOSPITAL_COMMUNITY): Payer: Self-pay | Admitting: *Deleted

## 2013-09-07 DIAGNOSIS — D129 Benign neoplasm of anus and anal canal: Secondary | ICD-10-CM | POA: Diagnosis not present

## 2013-09-07 DIAGNOSIS — I129 Hypertensive chronic kidney disease with stage 1 through stage 4 chronic kidney disease, or unspecified chronic kidney disease: Secondary | ICD-10-CM | POA: Diagnosis not present

## 2013-09-07 DIAGNOSIS — Z885 Allergy status to narcotic agent status: Secondary | ICD-10-CM | POA: Insufficient documentation

## 2013-09-07 DIAGNOSIS — N189 Chronic kidney disease, unspecified: Secondary | ICD-10-CM | POA: Insufficient documentation

## 2013-09-07 DIAGNOSIS — E538 Deficiency of other specified B group vitamins: Secondary | ICD-10-CM | POA: Insufficient documentation

## 2013-09-07 DIAGNOSIS — Q438 Other specified congenital malformations of intestine: Secondary | ICD-10-CM | POA: Insufficient documentation

## 2013-09-07 DIAGNOSIS — Z882 Allergy status to sulfonamides status: Secondary | ICD-10-CM | POA: Insufficient documentation

## 2013-09-07 DIAGNOSIS — K5732 Diverticulitis of large intestine without perforation or abscess without bleeding: Secondary | ICD-10-CM | POA: Diagnosis present

## 2013-09-07 DIAGNOSIS — K5730 Diverticulosis of large intestine without perforation or abscess without bleeding: Secondary | ICD-10-CM | POA: Insufficient documentation

## 2013-09-07 DIAGNOSIS — Z79899 Other long term (current) drug therapy: Secondary | ICD-10-CM | POA: Diagnosis not present

## 2013-09-07 DIAGNOSIS — Z87891 Personal history of nicotine dependence: Secondary | ICD-10-CM | POA: Diagnosis not present

## 2013-09-07 DIAGNOSIS — E785 Hyperlipidemia, unspecified: Secondary | ICD-10-CM | POA: Insufficient documentation

## 2013-09-07 DIAGNOSIS — D128 Benign neoplasm of rectum: Secondary | ICD-10-CM | POA: Diagnosis not present

## 2013-09-07 DIAGNOSIS — Z90710 Acquired absence of both cervix and uterus: Secondary | ICD-10-CM | POA: Diagnosis not present

## 2013-09-07 DIAGNOSIS — D126 Benign neoplasm of colon, unspecified: Secondary | ICD-10-CM | POA: Diagnosis not present

## 2013-09-07 HISTORY — PX: COLONOSCOPY: SHX5424

## 2013-09-07 SURGERY — COLONOSCOPY
Anesthesia: Moderate Sedation

## 2013-09-07 MED ORDER — MEPERIDINE HCL 100 MG/ML IJ SOLN
INTRAMUSCULAR | Status: AC
  Filled 2013-09-07: qty 2

## 2013-09-07 MED ORDER — MIDAZOLAM HCL 5 MG/5ML IJ SOLN
INTRAMUSCULAR | Status: DC | PRN
  Administered 2013-09-07 (×2): 1 mg via INTRAVENOUS
  Administered 2013-09-07: 2 mg via INTRAVENOUS

## 2013-09-07 MED ORDER — STERILE WATER FOR IRRIGATION IR SOLN
Status: DC | PRN
  Administered 2013-09-07: 14:00:00

## 2013-09-07 MED ORDER — MIDAZOLAM HCL 5 MG/5ML IJ SOLN
INTRAMUSCULAR | Status: DC
Start: 2013-09-07 — End: 2013-09-09
  Filled 2013-09-07: qty 10

## 2013-09-07 MED ORDER — MEPERIDINE HCL 100 MG/ML IJ SOLN
INTRAMUSCULAR | Status: DC | PRN
  Administered 2013-09-07 (×2): 25 mg via INTRAVENOUS

## 2013-09-07 MED ORDER — SODIUM CHLORIDE 0.9 % IV SOLN
INTRAVENOUS | Status: DC
  Administered 2013-09-07: 13:00:00 via INTRAVENOUS

## 2013-09-07 NOTE — Discharge Instructions (Signed)
You had 5 polyps removed. You have s internal hemorrhoids and diverticulosis IN YOUR RIGHT AND LEFT COLON.    FOLLOW A HIGH FIBER DIET. AVOID ITEMS THAT CAUSE BLOATING. SEE INFO BELOW.  YOUR BIOPSY RESULTS SHOULD BE BACK IN 14 DAYS.     Colonoscopy Care After Read the instructions outlined below and refer to this sheet in the next week. These discharge instructions provide you with general information on caring for yourself after you leave the hospital. While your treatment has been planned according to the most current medical practices available, unavoidable complications occasionally occur. If you have any problems or questions after discharge, call DR. Raya Mckinstry, [phone].  ACTIVITY  You may resume your regular activity, but move at a slower pace for the next 24 hours.   Take frequent rest periods for the next 24 hours.   Walking will help get rid of the air and reduce the bloated feeling in your belly (abdomen).   No driving for 24 hours (because of the medicine (anesthesia) used during the test).   You may shower.   Do not sign any important legal documents or operate any machinery for 24 hours (because of the anesthesia used during the test).    NUTRITION  Drink plenty of fluids.   You may resume your normal diet as instructed by your doctor.   Begin with a light meal and progress to your normal diet. Heavy or fried foods are harder to digest and may make you feel sick to your stomach (nauseated).   Avoid alcoholic beverages for 24 hours or as instructed.    MEDICATIONS  You may resume your normal medications.   WHAT YOU CAN EXPECT TODAY  Some feelings of bloating in the abdomen.   Passage of more gas than usual.   Spotting of blood in your stool or on the toilet paper  .  IF YOU HAD POLYPS REMOVED DURING THE COLONOSCOPY:  Eat a soft diet IF YOU HAVE NAUSEA, BLOATING, ABDOMINAL PAIN, OR VOMITING.    FINDING OUT THE RESULTS OF YOUR TEST Not all test  results are available during your visit. DR. Oneida Alar WILL CALL YOU WITHIN 7 DAYS OF YOUR PROCEDUE WITH YOUR RESULTS. Do not assume everything is normal if you have not heard from DR. Shanikqua Zarzycki IN ONE WEEK, CALL HER OFFICE AT [phone].  SEEK IMMEDIATE MEDICAL ATTENTION AND CALL THE OFFICE: [phone] IF:  You have more than a spotting of blood in your stool.   Your belly is swollen (abdominal distention).   You are nauseated or vomiting.   You have a temperature over 101F.   You have abdominal pain or discomfort that is severe or gets worse throughout the day.  Polyps, Colon  A polyp is extra tissue that grows inside your body. Colon polyps grow in the large intestine. The large intestine, also called the colon, is part of your digestive system. It is a long, hollow tube at the end of your digestive tract where your body makes and stores stool. Most polyps are not dangerous. They are benign. This means they are not cancerous. But over time, some types of polyps can turn into cancer. Polyps that are smaller than a pea are usually not harmful. But larger polyps could someday become or may already be cancerous. To be safe, doctors remove all polyps and test them.   WHO GETS POLYPS? Anyone can get polyps, but certain people are more likely than others. You may have a greater chance of getting polyps  if:  You are over 50.   You have had polyps before.   Someone in your family has had polyps.   Someone in your family has had cancer of the large intestine.   Find out if someone in your family has had polyps. You may also be more likely to get polyps if you:   Eat a lot of fatty foods   Smoke   Drink alcohol   Do not exercise  Eat too much   TREATMENT  The caregiver will remove the polyp during sigmoidoscopy or colonoscopy.  PREVENTION There is not one sure way to prevent polyps. You might be able to lower your risk of getting them if you:  Eat more fruits and vegetables and less  fatty food.   Do not smoke.   Avoid alcohol.   Exercise every day.   Lose weight if you are overweight.   Eating more calcium and folate can also lower your risk of getting polyps. Some foods that are rich in calcium are milk, cheese, and broccoli. Some foods that are rich in folate are chickpeas, kidney beans, and spinach.   High-Fiber Diet A high-fiber diet changes your normal diet to include more whole grains, legumes, fruits, and vegetables. Changes in the diet involve replacing refined carbohydrates with unrefined foods. The calorie level of the diet is essentially unchanged. The Dietary Reference Intake (recommended amount) for adult males is 38 grams per day. For adult females, it is 25 grams per day. Pregnant and lactating women should consume 28 grams of fiber per day. Fiber is the intact part of a plant that is not broken down during digestion. Functional fiber is fiber that has been isolated from the plant to provide a beneficial effect in the body. PURPOSE  Increase stool bulk.   Ease and regulate bowel movements.   Lower cholesterol.  INDICATIONS THAT YOU NEED MORE FIBER  Constipation and hemorrhoids.   Uncomplicated diverticulosis (intestine condition) and irritable bowel syndrome.   Weight management.   As a protective measure against hardening of the arteries (atherosclerosis), diabetes, and cancer.   GUIDELINES FOR INCREASING FIBER IN THE DIET  Start adding fiber to the diet slowly. A gradual increase of about 5 more grams (2 slices of whole-wheat bread, 2 servings of most fruits or vegetables, or 1 bowl of high-fiber cereal) per day is best. Too rapid an increase in fiber may result in constipation, flatulence, and bloating.   Drink enough water and fluids to keep your urine clear or pale yellow. Water, juice, or caffeine-free drinks are recommended. Not drinking enough fluid may cause constipation.   Eat a variety of high-fiber foods rather than one type of  fiber.   Try to increase your intake of fiber through using high-fiber foods rather than fiber pills or supplements that contain small amounts of fiber.   The goal is to change the types of food eaten. Do not supplement your present diet with high-fiber foods, but replace foods in your present diet.  INCLUDE A VARIETY OF FIBER SOURCES  Replace refined and processed grains with whole grains, canned fruits with fresh fruits, and incorporate other fiber sources. White rice, white breads, and most bakery goods contain little or no fiber.   Brown whole-grain rice, buckwheat oats, and many fruits and vegetables are all good sources of fiber. These include: broccoli, Brussels sprouts, cabbage, cauliflower, beets, sweet potatoes, white potatoes (skin on), carrots, tomatoes, eggplant, squash, berries, fresh fruits, and dried fruits.   Cereals appear  to be the richest source of fiber. Cereal fiber is found in whole grains and bran. Bran is the fiber-rich outer coat of cereal grain, which is largely removed in refining. In whole-grain cereals, the bran remains. In breakfast cereals, the largest amount of fiber is found in those with "bran" in their names. The fiber content is sometimes indicated on the label.   You may need to include additional fruits and vegetables each day.   In baking, for 1 cup white flour, you may use the following substitutions:   1 cup whole-wheat flour minus 2 tablespoons.   1/2 cup white flour plus 1/2 cup whole-wheat flour.   Diverticulosis Diverticulosis is a common condition that develops when small pouches (diverticula) form in the wall of the colon. The risk of diverticulosis increases with age. It happens more often in people who eat a low-fiber diet. Most individuals with diverticulosis have no symptoms. Those individuals with symptoms usually experience belly (abdominal) pain, constipation, or loose stools (diarrhea).  HOME CARE INSTRUCTIONS  Increase the amount of  fiber in your diet as directed by your caregiver or dietician. This may reduce symptoms of diverticulosis.   Drink at least 6 to 8 glasses of water each day to prevent constipation.   Try not to strain when you have a bowel movement.   Avoiding nuts and seeds to prevent complications is still an uncertain benefit.       FOODS HAVING HIGH FIBER CONTENT INCLUDE:  Fruits. Apple, peach, pear, tangerine, raisins, prunes.   Vegetables. Brussels sprouts, asparagus, broccoli, cabbage, carrot, cauliflower, romaine lettuce, spinach, summer squash, tomato, winter squash, zucchini.   Starchy Vegetables. Baked beans, kidney beans, lima beans, split peas, lentils, potatoes (with skin).   Grains. Whole wheat bread, brown rice, bran flake cereal, plain oatmeal, white rice, shredded wheat, bran muffins.    SEEK IMMEDIATE MEDICAL CARE IF:  You develop increasing pain or severe bloating.   You have an oral temperature above 101F.   You develop vomiting or bowel movements that are bloody or black.   Hemorrhoids Hemorrhoids are dilated (enlarged) veins around the rectum. Sometimes clots will form in the veins. This makes them swollen and painful. These are called thrombosed hemorrhoids. Causes of hemorrhoids include:  Constipation.   Straining to have a bowel movement.   HEAVY LIFTING HOME CARE INSTRUCTIONS  Eat a well balanced diet and drink 6 to 8 glasses of water every day to avoid constipation. You may also use a bulk laxative.   Avoid straining to have bowel movements.   Keep anal area dry and clean.   Do not use a donut shaped pillow or sit on the toilet for long periods. This increases blood pooling and pain.   Move your bowels when your body has the urge; this will require less straining and will decrease pain and pressure.
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
## 2013-09-07 NOTE — Interval H&P Note (Signed)
History and Physical Interval Note:  09/07/2013 1:16 PM  Ashley Olsen  has presented today for surgery, with the diagnosis of RECURRENT DIVERTICULITIS  The various methods of treatment have been discussed with the patient and family. After consideration of risks, benefits and other options for treatment, the patient has consented to  Procedure(s) with comments: COLONOSCOPY (N/A) - 2:00 as a surgical intervention .  The patient's history has been reviewed, patient examined, no change in status, stable for surgery.  I have reviewed the patient's chart and labs.  Questions were answered to the patient's satisfaction.     [employer]

## 2013-09-10 ENCOUNTER — Encounter (INDEPENDENT_AMBULATORY_CARE_PROVIDER_SITE_OTHER): Payer: Self-pay | Admitting: Surgery

## 2013-09-10 ENCOUNTER — Ambulatory Visit (INDEPENDENT_AMBULATORY_CARE_PROVIDER_SITE_OTHER): Payer: Medicare Other | Admitting: Surgery

## 2013-09-10 ENCOUNTER — Telehealth: Payer: Self-pay | Admitting: Gastroenterology

## 2013-09-10 VITALS — BP 122/72 | HR 70 | Temp 98.5°F | Ht 63.0 in | Wt 164.5 lb

## 2013-09-10 DIAGNOSIS — K5730 Diverticulosis of large intestine without perforation or abscess without bleeding: Secondary | ICD-10-CM | POA: Diagnosis not present

## 2013-09-10 NOTE — Telephone Encounter (Signed)
Ashley Olsen called and said that her mom seems fine now. She said that she and her sister feel that her mom is in early stages of dementia. She is kind of forgetful and gets a little upset easy. She said her mom had said she was having chills, but come to find out she had her air on. She had seen just a little blood but nothing to worry about, and she told her mom that was to be expected since she had polyps removed.  She said her mom is doing well today and they will call if they need to.

## 2013-09-10 NOTE — Telephone Encounter (Signed)
LMOM for daughter, [name], to return call and give me a little more information.

## 2013-09-10 NOTE — Op Note (Signed)
[facility] [address] [place], [address]   COLONOSCOPY PROCEDURE REPORT  PATIENT: Ashley Olsen, Ashley Olsen  MR#: [id number] BIRTHDATE: 09/12/1932 , 81  yrs. old GENDER: Female ENDOSCOPIST: Barney Drain, MD REFERRED [id number] Hilma Favors, M.D.  Storm Frisk, M.D. PROCEDURE DATE:  09/07/2013 PROCEDURE:   Submucosal injection, any substance and Colonoscopy with cold biopsy polypectomy INDICATIONS:RECURRENT DIVERTICULITIS. MEDICATIONS: Demerol 75 mg IV and Versed 4 mg IV  DESCRIPTION OF PROCEDURE:    Physical exam was performed.  Informed consent was obtained from the patient after explaining the benefits, risks, and alternatives to procedure.  The patient was connected to monitor and placed in left lateral position. Continuous oxygen was provided by nasal cannula and IV medicine administered through an indwelling cannula.  After administration of sedation and rectal exam, the patients rectum was intubated and the [id number]  colonoscope was advanced under direct visualization to the ileum.  The scope was removed slowly by carefully examining the color, texture, anatomy, and integrity mucosa on the way out.  The patient was recovered in endoscopy and discharged home in satisfactory condition.    COLON FINDINGS: There was moderate diverticulosis noted in the transverse colon and descending colon with associated muscular hypertrophy.  5 POLYPS REMOVED FROM TEH RECTUM VIA COLD FORCEPS. The LEFT colon IS SLIGHTLY redundant.  Manual abdominal counter-pressure was used to reach the cecum. NORMAL ANASTOMOSIS. 1 CC SPOT PLACED AT MOST PROXIMAL DIVERTICULA WHICH APPREARED TO BE MID-TRANSVERSE COLON.  PREP QUALITY: good.  CECAL W/D TIME: 17 minutes     COMPLICATIONS: None  ENDOSCOPIC IMPRESSION: 1.   Moderate diverticulosis noted in the transverse colon and descending colon 2.   The LEFT colon IS redundant 3.   NORMAL ANASTOMOSIS.  1 CC SPOT PLACED AT MOST  PROXIMAL DIVERTICULA.   RECOMMENDATIONS: FOLLOW A HIGH FIBER DIET.  AVOID ITEMS THAT CAUSE BLOATING. BIOPSY RESULTS SHOULD BE BACK IN 14 DAYS. FOLLOW UP WITH DR.  Georgette Dover.       _______________________________ eSignedBarney Drain, MD 09/10/2013 3:55 PM

## 2013-09-10 NOTE — Telephone Encounter (Signed)
PATIENT DAUGHTER CALLED REGARDING MOTHER WHO HAD COLONOSCOPY ON 09/07/13.  PATIENT DAUGHTER SAID SHE WAS SORE IN HER STOMACH, CAN HARDLY WALK, AND HAS SOME BLEEDING.  PLEASE CALL HER BACK AT [phone]
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
## 2013-09-11 NOTE — Telephone Encounter (Signed)
REVIEWED-NO ADDITIONAL RECOMMENDATIONS. 

## 2013-09-12 NOTE — Progress Notes (Signed)
Cc to pcp °

## 2013-09-19 DIAGNOSIS — N183 Chronic kidney disease, stage 3 unspecified: Secondary | ICD-10-CM | POA: Diagnosis not present

## 2013-09-19 DIAGNOSIS — D351 Benign neoplasm of parathyroid gland: Secondary | ICD-10-CM | POA: Diagnosis not present

## 2013-09-19 DIAGNOSIS — F17200 Nicotine dependence, unspecified, uncomplicated: Secondary | ICD-10-CM | POA: Diagnosis not present

## 2013-09-19 DIAGNOSIS — E785 Hyperlipidemia, unspecified: Secondary | ICD-10-CM | POA: Diagnosis not present

## 2013-09-19 DIAGNOSIS — I10 Essential (primary) hypertension: Secondary | ICD-10-CM | POA: Diagnosis not present

## 2013-09-20 NOTE — Telephone Encounter (Addendum)
Please call pt. She had a simple adenoma removed. FOLLOW A High fiber diet. TCS in 5-10 years IF THE BENEFITS OUTWEIGH THE RISKS.

## 2013-09-20 NOTE — Telephone Encounter (Signed)
Reminder in epic °

## 2013-09-21 NOTE — Telephone Encounter (Signed)
Also LMOM for pt's daughter, Fannie Knee, that all was OK and next colonoscopy in 5-10 years.

## 2013-09-21 NOTE — Telephone Encounter (Signed)
Called and informed pt.  

## 2013-09-27 DIAGNOSIS — Z23 Encounter for immunization: Secondary | ICD-10-CM | POA: Diagnosis not present

## 2013-10-08 ENCOUNTER — Telehealth: Payer: Self-pay | Admitting: Internal Medicine

## 2013-10-08 NOTE — Telephone Encounter (Signed)
Pt states has been having chest pain for a few days. She has appointment with Dr.Taylor in November in [place].   she can't wait that long. Pt states she can come to this office if needed. An appointment was made for pt for tomorrow 10/09/13 at 1:45 PM pt is aware.

## 2013-10-08 NOTE — Telephone Encounter (Signed)
New problem:   Per pt she has been CP sine.

## 2013-10-09 ENCOUNTER — Encounter: Payer: Self-pay | Admitting: Internal Medicine

## 2013-10-09 ENCOUNTER — Ambulatory Visit (INDEPENDENT_AMBULATORY_CARE_PROVIDER_SITE_OTHER): Payer: Medicare Other | Admitting: Internal Medicine

## 2013-10-09 ENCOUNTER — Ambulatory Visit: Payer: Medicare Other | Admitting: Adult Health

## 2013-10-09 VITALS — BP 136/84 | HR 73 | Ht 63.0 in | Wt 167.2 lb

## 2013-10-09 DIAGNOSIS — I495 Sick sinus syndrome: Secondary | ICD-10-CM | POA: Diagnosis not present

## 2013-10-09 DIAGNOSIS — Z950 Presence of cardiac pacemaker: Secondary | ICD-10-CM

## 2013-10-09 DIAGNOSIS — I10 Essential (primary) hypertension: Secondary | ICD-10-CM

## 2013-10-09 LAB — MDC_IDC_ENUM_SESS_TYPE_INCLINIC
Implantable Pulse Generator Serial Number: 15732
Lead Channel Pacing Threshold Amplitude: 1.5 V
Lead Channel Setting Pacing Pulse Width: 0.8 ms
Lead Channel Setting Sensing Sensitivity: 2 mV
MDC IDC MSMT LEADCHNL RV PACING THRESHOLD PULSEWIDTH: 0.8 ms
MDC IDC MSMT LEADCHNL RV SENSING INTR AMPL: 4 mV
MDC IDC SET LEADCHNL RV PACING AMPLITUDE: 4 V

## 2013-10-09 NOTE — Patient Instructions (Signed)
Your physician wants you to follow-up in: 3 months with device clinic and 12 months with Dr Taylor You will receive a reminder letter in the mail two months in advance. If you don't receive a letter, please call our office to schedule the follow-up appointment.  

## 2013-10-09 NOTE — Assessment & Plan Note (Signed)
Her [employer] DDD PM is working normally. Will recheck in several months.  

## 2013-10-09 NOTE — Assessment & Plan Note (Signed)
Her blood pressure is fairly well controlled. Will follow. No change in meds.
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
## 2013-10-12 ENCOUNTER — Encounter: Payer: Self-pay | Admitting: Internal Medicine

## 2013-11-22 ENCOUNTER — Encounter: Payer: Medicare Other | Admitting: Internal Medicine

## 2013-12-12 DIAGNOSIS — N2581 Secondary hyperparathyroidism of renal origin: Secondary | ICD-10-CM | POA: Diagnosis not present

## 2013-12-12 DIAGNOSIS — I10 Essential (primary) hypertension: Secondary | ICD-10-CM | POA: Diagnosis not present

## 2013-12-12 DIAGNOSIS — N183 Chronic kidney disease, stage 3 (moderate): Secondary | ICD-10-CM | POA: Diagnosis not present

## 2013-12-20 ENCOUNTER — Ambulatory Visit (INDEPENDENT_AMBULATORY_CARE_PROVIDER_SITE_OTHER): Payer: Medicare Other | Admitting: *Deleted

## 2013-12-20 DIAGNOSIS — I495 Sick sinus syndrome: Secondary | ICD-10-CM | POA: Diagnosis not present

## 2013-12-20 LAB — MDC_IDC_ENUM_SESS_TYPE_INCLINIC
Battery Impedance: 16000 Ohm
Battery Remaining Longevity: 10 mo
Battery Voltage: 2.57 V
Date Time Interrogation Session: 20151210104726
Lead Channel Impedance Value: 526 Ohm
Lead Channel Pacing Threshold Pulse Width: 0.8 ms
Lead Channel Sensing Intrinsic Amplitude: 4 mV
MDC IDC MSMT LEADCHNL RV PACING THRESHOLD AMPLITUDE: 2 V
MDC IDC PG SERIAL: 15732
MDC IDC SET LEADCHNL RV PACING AMPLITUDE: 3.5 V
MDC IDC SET LEADCHNL RV PACING PULSEWIDTH: 0.8 ms

## 2013-12-20 NOTE — Progress Notes (Signed)
PPM check in office. 

## 2013-12-28 ENCOUNTER — Encounter: Payer: Self-pay | Admitting: Internal Medicine

## 2014-03-22 ENCOUNTER — Emergency Department (HOSPITAL_COMMUNITY): Payer: Medicare Other

## 2014-03-22 ENCOUNTER — Emergency Department (HOSPITAL_COMMUNITY)
Admission: EM | Admit: 2014-03-22 | Discharge: 2014-03-22 | Disposition: A | Discharge status: Home / Self Care | Payer: Medicare Other | Attending: Emergency Medicine | Admitting: Emergency Medicine

## 2014-03-22 ENCOUNTER — Encounter (HOSPITAL_COMMUNITY): Payer: Self-pay

## 2014-03-22 DIAGNOSIS — Z79899 Other long term (current) drug therapy: Secondary | ICD-10-CM | POA: Diagnosis not present

## 2014-03-22 DIAGNOSIS — E785 Hyperlipidemia, unspecified: Secondary | ICD-10-CM | POA: Diagnosis not present

## 2014-03-22 DIAGNOSIS — I129 Hypertensive chronic kidney disease with stage 1 through stage 4 chronic kidney disease, or unspecified chronic kidney disease: Secondary | ICD-10-CM | POA: Diagnosis not present

## 2014-03-22 DIAGNOSIS — Z86718 Personal history of other venous thrombosis and embolism: Secondary | ICD-10-CM | POA: Diagnosis not present

## 2014-03-22 DIAGNOSIS — M25572 Pain in left ankle and joints of left foot: Secondary | ICD-10-CM | POA: Diagnosis not present

## 2014-03-22 DIAGNOSIS — M79662 Pain in left lower leg: Secondary | ICD-10-CM | POA: Diagnosis not present

## 2014-03-22 DIAGNOSIS — Z950 Presence of cardiac pacemaker: Secondary | ICD-10-CM | POA: Diagnosis not present

## 2014-03-22 DIAGNOSIS — M79605 Pain in left leg: Secondary | ICD-10-CM | POA: Insufficient documentation

## 2014-03-22 DIAGNOSIS — N189 Chronic kidney disease, unspecified: Secondary | ICD-10-CM | POA: Diagnosis not present

## 2014-03-22 DIAGNOSIS — Z8719 Personal history of other diseases of the digestive system: Secondary | ICD-10-CM | POA: Insufficient documentation

## 2014-03-22 DIAGNOSIS — Z720 Tobacco use: Secondary | ICD-10-CM | POA: Diagnosis not present

## 2014-03-22 MED ORDER — IBUPROFEN 800 MG PO TABS
800.0000 mg | ORAL_TABLET | Freq: Once | ORAL | Status: AC
  Administered 2014-03-22: 800 mg via ORAL

## 2014-03-22 MED ORDER — IBUPROFEN 800 MG PO TABS
ORAL_TABLET | ORAL | Status: AC
  Filled 2014-03-22: qty 1

## 2014-03-22 MED ORDER — HYDROCODONE-ACETAMINOPHEN 5-325 MG PO TABS: 1.0000 | ORAL_TABLET | Freq: Four times a day (QID) | ORAL | Status: DC | PRN

## 2014-03-22 NOTE — ED Notes (Signed)
Patient to US

## 2014-03-22 NOTE — ED Notes (Signed)
Pt c/o pain to left leg x 2 days, states it awoke her from sleep

## 2014-03-22 NOTE — ED Notes (Signed)
Patient with no complaints at this time. Respirations even and unlabored. Skin warm/dry. Discharge instructions reviewed with patient at this time. Patient given opportunity to voice concerns/ask questions. Patient discharged at this time and left Emergency Department with steady gait.   
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
## 2014-03-22 NOTE — Discharge Instructions (Signed)
Ibuprofen 600 mg 3 times daily for the next 5 days.  Hydrocodone as prescribed for pain not relieved with ibuprofen.  Return to the emergency department if your symptoms substantially worsen or change.   Musculoskeletal Pain Musculoskeletal pain is muscle and boney aches and pains. These pains can occur in any part of the body. Your caregiver may treat you without knowing the cause of the pain. They may treat you if blood or urine tests, X-rays, and other tests were normal.  CAUSES There is often not a definite cause or reason for these pains. These pains may be caused by a type of germ (virus). The discomfort may also come from overuse. Overuse includes working out too hard when your body is not fit. Boney aches also come from weather changes. Bone is sensitive to atmospheric pressure changes. HOME CARE INSTRUCTIONS   Ask when your test results will be ready. Make sure you get your test results.  Only take over-the-counter or prescription medicines for pain, discomfort, or fever as directed by your caregiver. If you were given medications for your condition, do not drive, operate machinery or power tools, or sign legal documents for 24 hours. Do not drink alcohol. Do not take sleeping pills or other medications that may interfere with treatment.  Continue all activities unless the activities cause more pain. When the pain lessens, slowly resume normal activities. Gradually increase the intensity and duration of the activities or exercise.  During periods of severe pain, bed rest may be helpful. Lay or sit in any position that is comfortable.  Putting ice on the injured area.  Put ice in a bag.  Place a towel between your skin and the bag.  Leave the ice on for 15 to 20 minutes, 3 to 4 times a day.  Follow up with your caregiver for continued problems and no reason can be found for the pain. If the pain becomes worse or does not go away, it may be necessary to repeat tests or do  additional testing. Your caregiver may need to look further for a possible cause. SEEK IMMEDIATE MEDICAL CARE IF:  You have pain that is getting worse and is not relieved by medications.  You develop chest pain that is associated with shortness or breath, sweating, feeling sick to your stomach (nauseous), or throw up (vomit).  Your pain becomes localized to the abdomen.  You develop any new symptoms that seem different or that concern you. MAKE SURE YOU:   Understand these instructions.  Will watch your condition.  Will get help right away if you are not doing well or get worse. Document Released: 12/28/2004 Document Revised: 03/22/2011 Document Reviewed: 09/01/2012 [facility] Patient Information 2015 [place], [place]. This information is not intended to replace advice given to you by your health care provider. Make sure you discuss any questions you have with your health care provider.

## 2014-03-25 DIAGNOSIS — M79605 Pain in left leg: Secondary | ICD-10-CM | POA: Diagnosis not present

## 2014-03-25 DIAGNOSIS — Z6829 Body mass index (BMI) 29.0-29.9, adult: Secondary | ICD-10-CM | POA: Diagnosis not present

## 2014-03-26 ENCOUNTER — Emergency Department (HOSPITAL_COMMUNITY)
Admission: EM | Admit: 2014-03-26 | Discharge: 2014-03-26 | Disposition: A | Discharge status: Home / Self Care | Payer: Medicare Other | Attending: Emergency Medicine | Admitting: Emergency Medicine

## 2014-03-26 ENCOUNTER — Encounter (HOSPITAL_COMMUNITY): Payer: Self-pay | Admitting: Emergency Medicine

## 2014-03-26 DIAGNOSIS — M791 Myalgia, unspecified site: Secondary | ICD-10-CM

## 2014-03-26 DIAGNOSIS — I129 Hypertensive chronic kidney disease with stage 1 through stage 4 chronic kidney disease, or unspecified chronic kidney disease: Secondary | ICD-10-CM | POA: Insufficient documentation

## 2014-03-26 DIAGNOSIS — E785 Hyperlipidemia, unspecified: Secondary | ICD-10-CM | POA: Insufficient documentation

## 2014-03-26 DIAGNOSIS — Z862 Personal history of diseases of the blood and blood-forming organs and certain disorders involving the immune mechanism: Secondary | ICD-10-CM | POA: Diagnosis not present

## 2014-03-26 DIAGNOSIS — N189 Chronic kidney disease, unspecified: Secondary | ICD-10-CM | POA: Insufficient documentation

## 2014-03-26 DIAGNOSIS — M1990 Unspecified osteoarthritis, unspecified site: Secondary | ICD-10-CM | POA: Insufficient documentation

## 2014-03-26 DIAGNOSIS — Z9889 Other specified postprocedural states: Secondary | ICD-10-CM | POA: Diagnosis not present

## 2014-03-26 DIAGNOSIS — Z86718 Personal history of other venous thrombosis and embolism: Secondary | ICD-10-CM | POA: Diagnosis not present

## 2014-03-26 DIAGNOSIS — Z79899 Other long term (current) drug therapy: Secondary | ICD-10-CM | POA: Diagnosis not present

## 2014-03-26 DIAGNOSIS — M79662 Pain in left lower leg: Secondary | ICD-10-CM | POA: Diagnosis not present

## 2014-03-26 DIAGNOSIS — Z8719 Personal history of other diseases of the digestive system: Secondary | ICD-10-CM | POA: Insufficient documentation

## 2014-03-26 DIAGNOSIS — Z950 Presence of cardiac pacemaker: Secondary | ICD-10-CM | POA: Diagnosis not present

## 2014-03-26 DIAGNOSIS — M810 Age-related osteoporosis without current pathological fracture: Secondary | ICD-10-CM | POA: Insufficient documentation

## 2014-03-26 DIAGNOSIS — Z720 Tobacco use: Secondary | ICD-10-CM | POA: Insufficient documentation

## 2014-03-26 LAB — CK: Total CK: 64 U/L (ref 7–177)

## 2014-03-26 MED ORDER — KETOROLAC TROMETHAMINE 60 MG/2ML IM SOLN
60.0000 mg | Freq: Once | INTRAMUSCULAR | Status: AC
  Administered 2014-03-26: 60 mg via INTRAMUSCULAR
  Filled 2014-03-26: qty 2

## 2014-03-26 MED ORDER — OXYCODONE-ACETAMINOPHEN 5-325 MG PO TABS
1.0000 | ORAL_TABLET | Freq: Once | ORAL | Status: AC
  Administered 2014-03-26: 1 via ORAL
  Filled 2014-03-26: qty 1

## 2014-03-26 MED ORDER — IBUPROFEN 400 MG PO TABS: 400.0000 mg | ORAL_TABLET | Freq: Four times a day (QID) | ORAL | Status: DC | PRN

## 2014-03-26 MED ORDER — METHOCARBAMOL 500 MG PO TABS: 500.0000 mg | ORAL_TABLET | Freq: Three times a day (TID) | ORAL | Status: DC | PRN

## 2014-03-26 MED ORDER — METHOCARBAMOL 500 MG PO TABS
1000.0000 mg | ORAL_TABLET | Freq: Once | ORAL | Status: AC
  Administered 2014-03-26: 1000 mg via ORAL
  Filled 2014-03-26: qty 2

## 2014-03-26 MED ORDER — OXYCODONE-ACETAMINOPHEN 5-325 MG PO TABS: 1.0000 | ORAL_TABLET | ORAL | Status: DC | PRN
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
## 2014-03-26 NOTE — Discharge Instructions (Signed)
Do not take your simvastatin until you're reevaluated by your primary doctor.  Musculoskeletal Pain Musculoskeletal pain is muscle and boney aches and pains. These pains can occur in any part of the body. Your caregiver may treat you without knowing the cause of the pain. They may treat you if blood or urine tests, X-rays, and other tests were normal.  CAUSES There is often not a definite cause or reason for these pains. These pains may be caused by a type of germ (virus). The discomfort may also come from overuse. Overuse includes working out too hard when your body is not fit. Boney aches also come from weather changes. Bone is sensitive to atmospheric pressure changes. HOME CARE INSTRUCTIONS   Ask when your test results will be ready. Make sure you get your test results.  Only take over-the-counter or prescription medicines for pain, discomfort, or fever as directed by your caregiver. If you were given medications for your condition, do not drive, operate machinery or power tools, or sign legal documents for 24 hours. Do not drink alcohol. Do not take sleeping pills or other medications that may interfere with treatment.  Continue all activities unless the activities cause more pain. When the pain lessens, slowly resume normal activities. Gradually increase the intensity and duration of the activities or exercise.  During periods of severe pain, bed rest may be helpful. Lay or sit in any position that is comfortable.  Putting ice on the injured area.  Put ice in a bag.  Place a towel between your skin and the bag.  Leave the ice on for 15 to 20 minutes, 3 to 4 times a day.  Follow up with your caregiver for continued problems and no reason can be found for the pain. If the pain becomes worse or does not go away, it may be necessary to repeat tests or do additional testing. Your caregiver may need to look further for a possible cause. SEEK IMMEDIATE MEDICAL CARE IF:  You have pain that  is getting worse and is not relieved by medications.  You develop chest pain that is associated with shortness or breath, sweating, feeling sick to your stomach (nauseous), or throw up (vomit).  Your pain becomes localized to the abdomen.  You develop any new symptoms that seem different or that concern you. MAKE SURE YOU:   Understand these instructions.  Will watch your condition.  Will get help right away if you are not doing well or get worse. Document Released: 12/28/2004 Document Revised: 03/22/2011 Document Reviewed: 09/01/2012 [facility] Patient Information 2015 [place], [place]. This information is not intended to replace advice given to you by your health care provider. Make sure you discuss any questions you have with your health care provider.

## 2014-03-26 NOTE — ED Notes (Signed)
Pt c/o left leg pain and pt was seen for the same last week.

## 2014-03-29 DIAGNOSIS — E663 Overweight: Secondary | ICD-10-CM | POA: Diagnosis not present

## 2014-03-29 DIAGNOSIS — B029 Zoster without complications: Secondary | ICD-10-CM | POA: Diagnosis not present

## 2014-03-29 DIAGNOSIS — Z6829 Body mass index (BMI) 29.0-29.9, adult: Secondary | ICD-10-CM | POA: Diagnosis not present

## 2014-04-18 DIAGNOSIS — E663 Overweight: Secondary | ICD-10-CM | POA: Diagnosis not present

## 2014-04-18 DIAGNOSIS — Z0000 Encounter for general adult medical examination without abnormal findings: Secondary | ICD-10-CM | POA: Diagnosis not present

## 2014-04-18 DIAGNOSIS — N183 Chronic kidney disease, stage 3 (moderate): Secondary | ICD-10-CM | POA: Diagnosis not present

## 2014-04-18 DIAGNOSIS — Z1389 Encounter for screening for other disorder: Secondary | ICD-10-CM | POA: Diagnosis not present

## 2014-04-18 DIAGNOSIS — I10 Essential (primary) hypertension: Secondary | ICD-10-CM | POA: Diagnosis not present

## 2014-04-18 DIAGNOSIS — Z6827 Body mass index (BMI) 27.0-27.9, adult: Secondary | ICD-10-CM | POA: Diagnosis not present

## 2014-05-16 DIAGNOSIS — N183 Chronic kidney disease, stage 3 (moderate): Secondary | ICD-10-CM | POA: Diagnosis not present

## 2014-05-16 DIAGNOSIS — I10 Essential (primary) hypertension: Secondary | ICD-10-CM | POA: Diagnosis not present

## 2014-06-17 DIAGNOSIS — N183 Chronic kidney disease, stage 3 (moderate): Secondary | ICD-10-CM | POA: Diagnosis not present

## 2014-06-17 DIAGNOSIS — H2511 Age-related nuclear cataract, right eye: Secondary | ICD-10-CM | POA: Diagnosis not present

## 2014-06-17 DIAGNOSIS — Z9842 Cataract extraction status, left eye: Secondary | ICD-10-CM | POA: Diagnosis not present

## 2014-06-17 DIAGNOSIS — D3131 Benign neoplasm of right choroid: Secondary | ICD-10-CM | POA: Diagnosis not present

## 2014-06-17 DIAGNOSIS — Z961 Presence of intraocular lens: Secondary | ICD-10-CM | POA: Diagnosis not present

## 2014-07-12 ENCOUNTER — Encounter: Payer: Self-pay | Admitting: *Deleted

## 2014-07-21 IMAGING — CT CT ABD-PELV W/O CM
2 of 4 series · 16 of 46 positions shown, 18 images · non-contrast
Comparison: CT abdomen pelvis 06/17/2010, abdominal CT 11/27/2007

CLINICAL DATA: Abdominal pain, unspecified site/CKD (chronic kidney
disease), unspecified stage/Hematuria/LLQ pain

EXAM:
CT ABDOMEN AND PELVIS WITHOUT CONTRAST
TECHNIQUE: Multidetector CT imaging of the abdomen and pelvis was performed
following the standard protocol without IV contrast.

[Series 2: standard/full over (age)lbs 5.0 · axial · 0.72mm/px · z∈[-462,-52]mm · 13 of 90 slices shown, 15 images]
[im 4/90  soft-tissue]
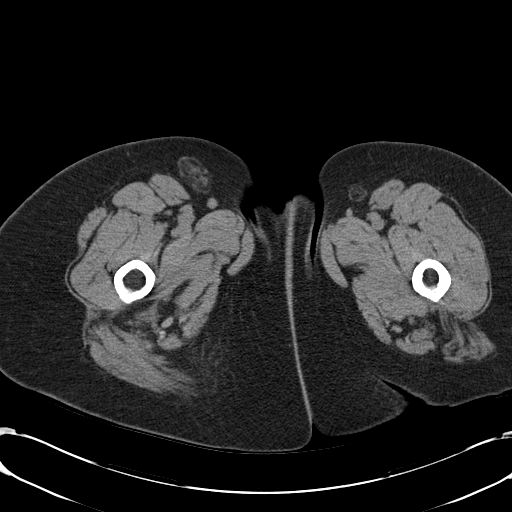
[im 4/90  bone]
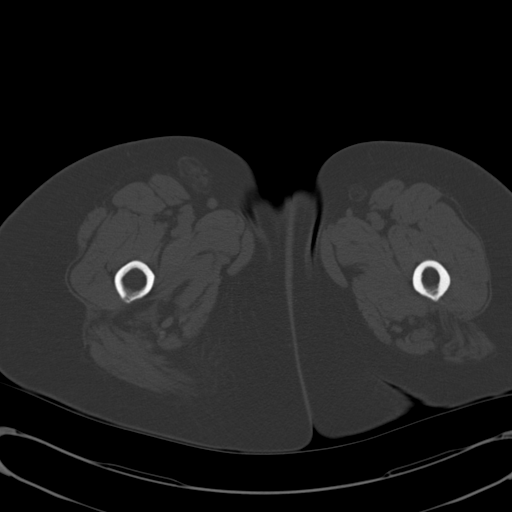
[im 11/90  soft-tissue]
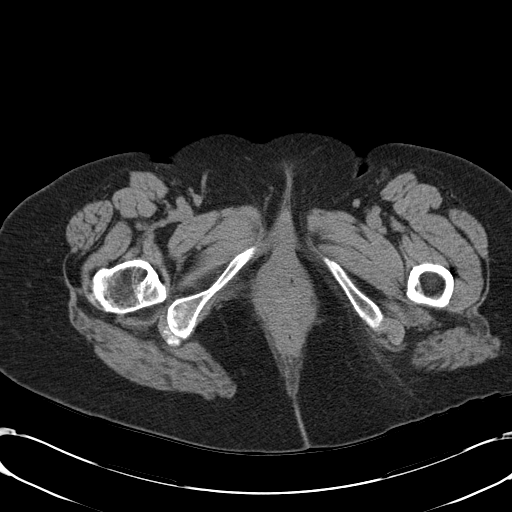
[im 18/90  soft-tissue]
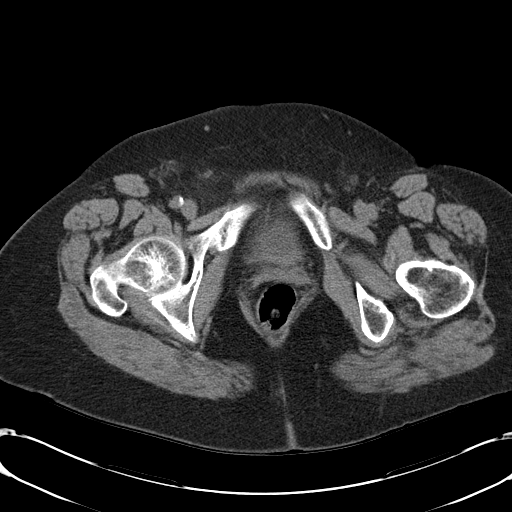
[im 25/90  soft-tissue]
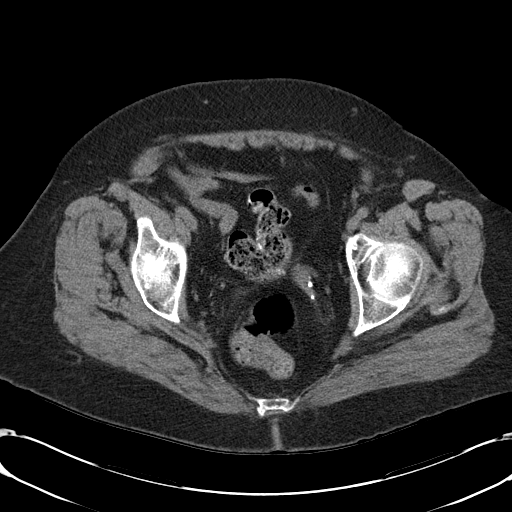
[im 33/90  soft-tissue]
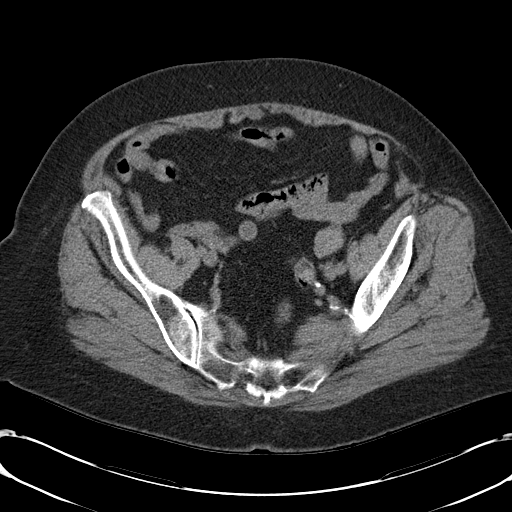
[im 40/90  soft-tissue]
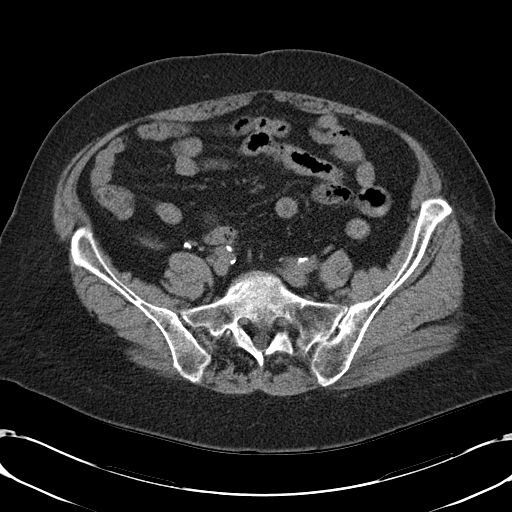
[im 47/90  soft-tissue]
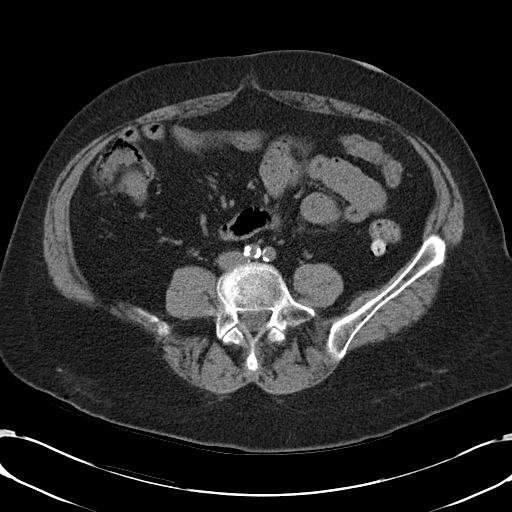
[im 50/90  soft-tissue]
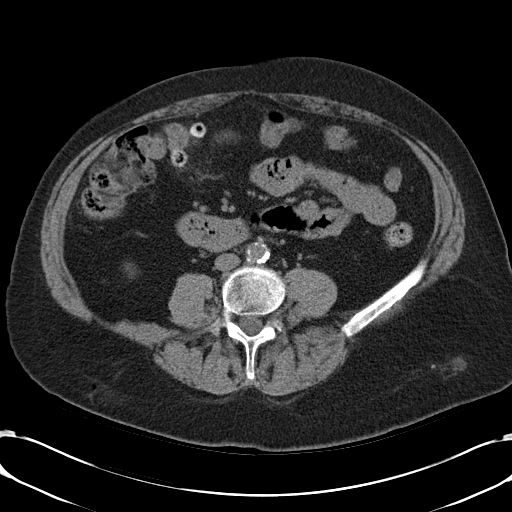
[im 57/90  soft-tissue]
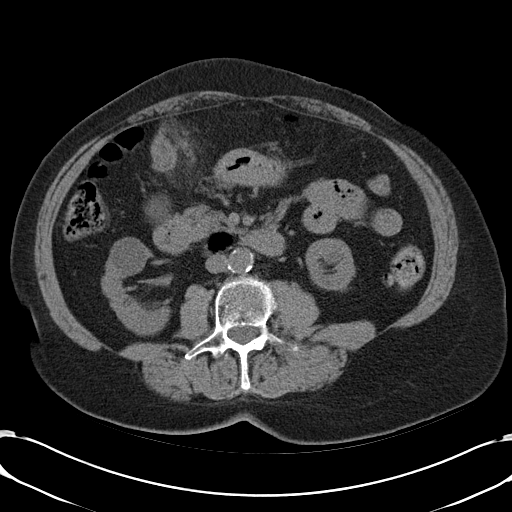
[im 57/90  bone]
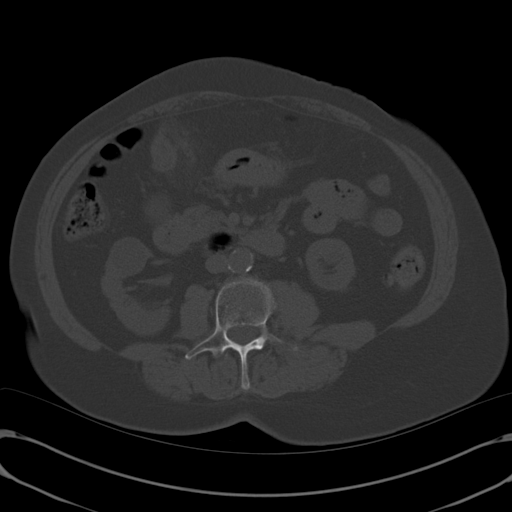
[im 65/90  soft-tissue]
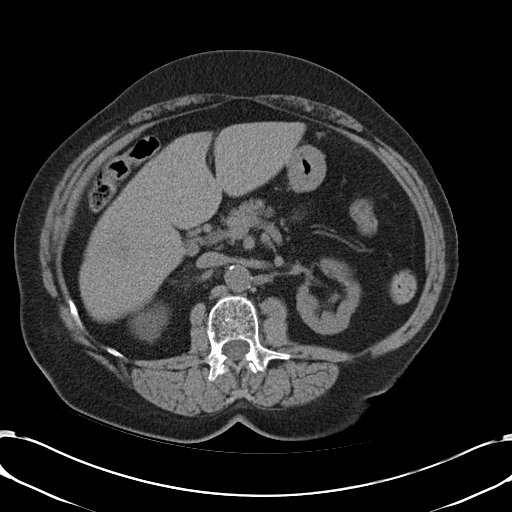
[im 72/90  soft-tissue]
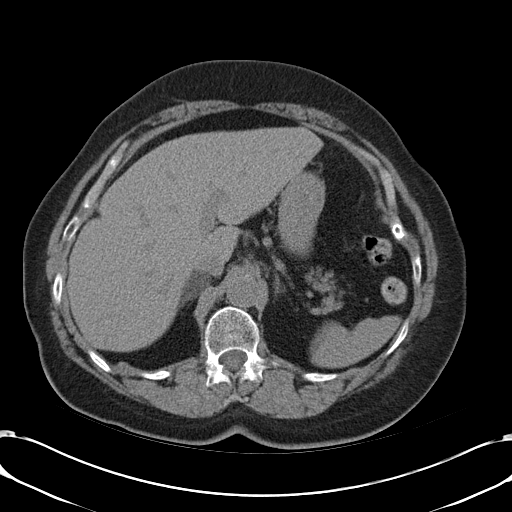
[im 79/90  soft-tissue]
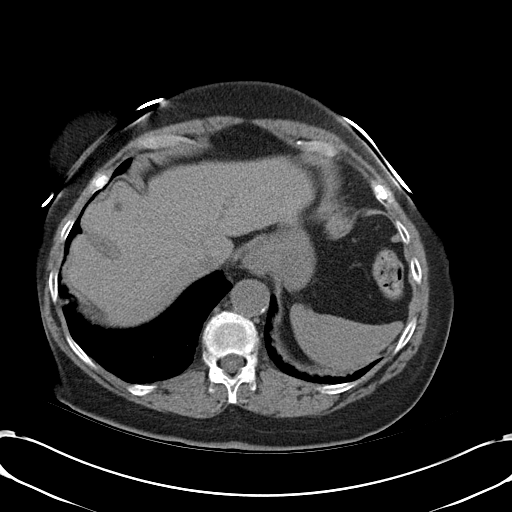
[im 86/90  soft-tissue]
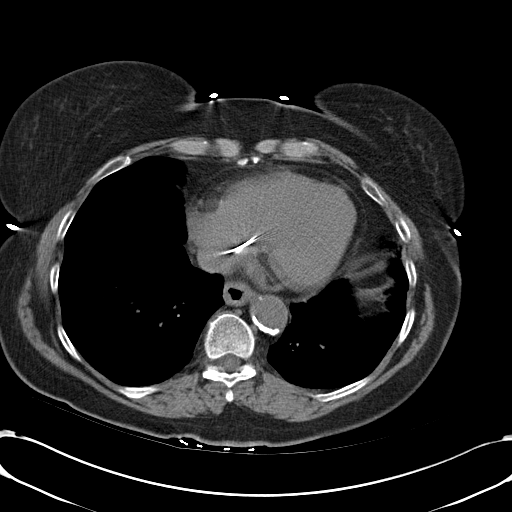

[Series 3: mpr coronal · coronal · 0.72mm/px · 3 of 85 slices shown]
[im 29/85  soft-tissue]
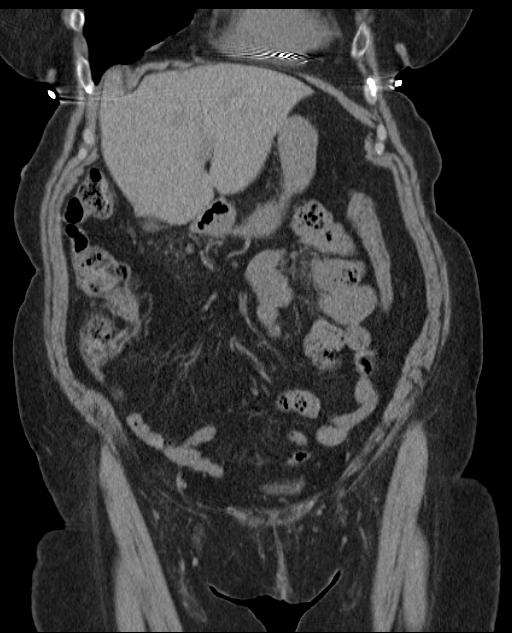
[im 38/85  soft-tissue]
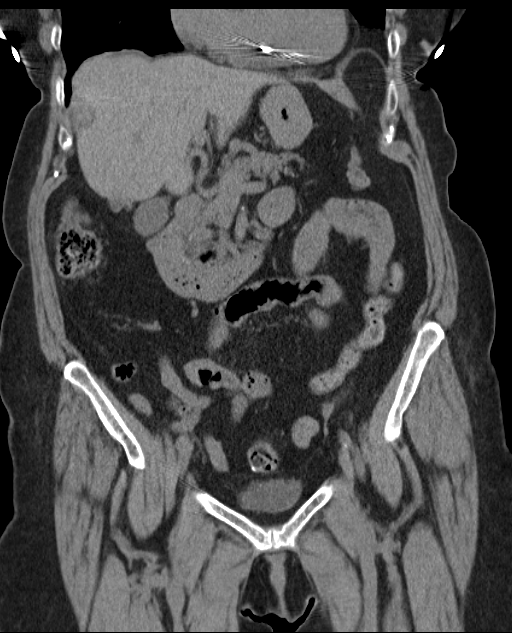
[im 47/85  soft-tissue]
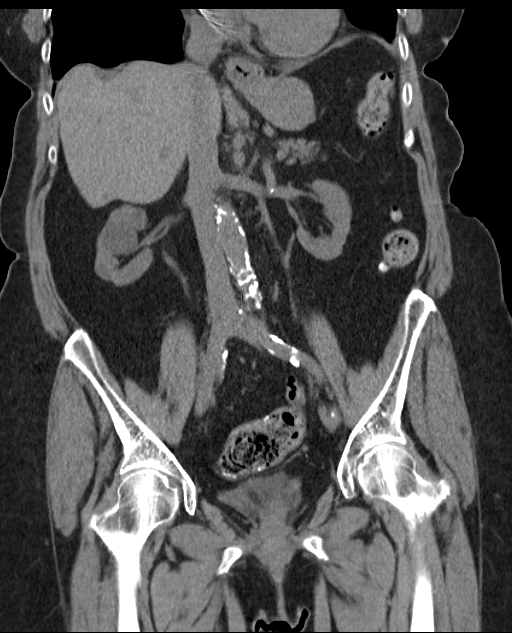

[16 of 46 positions shown; findings below may reference images not displayed]

FINDINGS: Linear areas of increased density within the lung bases scar versus
atelectasis.

Stable multiple low attenuating foci within the right and left lobes
of liver. Stable 2.4 cm low attenuating nodule anterior periphery of
the left lobe of the liver. Liver is otherwise unremarkable. Stable
right adrenal adenoma. The left adrenal is unremarkable.

Stable benign-appearing cysts within the right kidney. The kidneys
are atrophic. There is no evidence of obstructive uropathy nor renal
calculus disease.

Atherosclerotic calcification aorta. No evidence of abdominal aortic
aneurysm.

The pancreas and spleen are unremarkable.

Transverse colon (images 32 through 42 series 2) there is diffuse
inflammatory change in the surrounding mesenteric fat, mild bowel
wall thickening, and small punctate foci of air within the
mesenteric. Foci of diverticulosis are appreciated involving
dissection of bowel. No drainable loculated fluid collections are
appreciated. Further areas of diverticulosis are appreciated within
the descending colon and to a lesser extent sigmoid colon.

There is no evidence of bowel obstruction.

Within the limitations of a noncontrast CT, no further evidence of
abdominal or pelvic masses, loculated fluid collections, nor
adenopathy.

Very small fat containing umbilical hernia is appreciated. A fat
containing right inguinal hernia is appreciated. No evidence of an
inguinal hernia on the left.

No aggressive appearing osseous lesions.
IMPRESSION: Diverticulitis with microperforation involving the proximal
transverse colon.

Stable benign appearing low attenuating foci within the liver.

Stable Bosniak type 1 cysts right kidney

Stable right adrenal adenoma

## 2014-07-22 ENCOUNTER — Telehealth: Payer: Self-pay | Admitting: Internal Medicine

## 2014-07-22 NOTE — Telephone Encounter (Signed)
New Message       Pt calling stating that she got a letter from the device clinic and doesn't know what she needs to schedule. Please call back and advise.

## 2014-07-22 NOTE — Telephone Encounter (Signed)
LMOVM for pt to return call 

## 2014-07-23 NOTE — Telephone Encounter (Signed)
Informed pt that she got letter b/c she was suppose to come see device clinic in June 2016. Pt verbalized understanding and agreed to appt on 7-18 at 2:00 PM w/ device clinic in the [place] office.

## 2014-07-29 ENCOUNTER — Ambulatory Visit (INDEPENDENT_AMBULATORY_CARE_PROVIDER_SITE_OTHER): Payer: Medicare Other | Admitting: *Deleted

## 2014-07-29 DIAGNOSIS — I495 Sick sinus syndrome: Secondary | ICD-10-CM

## 2014-07-29 LAB — CUP PACEART INCLINIC DEVICE CHECK
Date Time Interrogation Session: 20160718141558
Lead Channel Sensing Intrinsic Amplitude: 4 mV
MDC IDC MSMT LEADCHNL RV PACING THRESHOLD AMPLITUDE: 1.5 V
MDC IDC MSMT LEADCHNL RV PACING THRESHOLD PULSEWIDTH: 0.8 ms
Pulse Gen Serial Number: 15732

## 2014-07-29 NOTE — Progress Notes (Addendum)
Pacemaker check in clinic. Battery voltage 2.53 V---per SJM 8 mths to ERI with 100% pacing. Normal device function. ROV in 3 mths w/GT in RDS.

## 2014-08-21 ENCOUNTER — Encounter: Payer: Self-pay | Admitting: Internal Medicine

## 2014-09-19 DIAGNOSIS — Z23 Encounter for immunization: Secondary | ICD-10-CM | POA: Diagnosis not present

## 2014-09-26 DIAGNOSIS — Z6829 Body mass index (BMI) 29.0-29.9, adult: Secondary | ICD-10-CM | POA: Diagnosis not present

## 2014-09-26 DIAGNOSIS — Z1389 Encounter for screening for other disorder: Secondary | ICD-10-CM | POA: Diagnosis not present

## 2014-09-26 DIAGNOSIS — E663 Overweight: Secondary | ICD-10-CM | POA: Diagnosis not present

## 2014-09-26 DIAGNOSIS — J069 Acute upper respiratory infection, unspecified: Secondary | ICD-10-CM | POA: Diagnosis not present

## 2014-09-26 DIAGNOSIS — I10 Essential (primary) hypertension: Secondary | ICD-10-CM | POA: Diagnosis not present

## 2014-10-02 ENCOUNTER — Emergency Department (HOSPITAL_COMMUNITY)
Admission: EM | Admit: 2014-10-02 | Discharge: 2014-10-02 | Disposition: A | Discharge status: Home / Self Care | Payer: Medicare Other | Attending: Emergency Medicine | Admitting: Emergency Medicine

## 2014-10-02 ENCOUNTER — Emergency Department (HOSPITAL_COMMUNITY): Payer: Medicare Other

## 2014-10-02 ENCOUNTER — Encounter (HOSPITAL_COMMUNITY): Payer: Self-pay | Admitting: Emergency Medicine

## 2014-10-02 DIAGNOSIS — Z720 Tobacco use: Secondary | ICD-10-CM | POA: Insufficient documentation

## 2014-10-02 DIAGNOSIS — Y9389 Activity, other specified: Secondary | ICD-10-CM | POA: Diagnosis not present

## 2014-10-02 DIAGNOSIS — Y929 Unspecified place or not applicable: Secondary | ICD-10-CM | POA: Insufficient documentation

## 2014-10-02 DIAGNOSIS — Z862 Personal history of diseases of the blood and blood-forming organs and certain disorders involving the immune mechanism: Secondary | ICD-10-CM | POA: Diagnosis not present

## 2014-10-02 DIAGNOSIS — Z79899 Other long term (current) drug therapy: Secondary | ICD-10-CM | POA: Insufficient documentation

## 2014-10-02 DIAGNOSIS — R05 Cough: Secondary | ICD-10-CM | POA: Diagnosis not present

## 2014-10-02 DIAGNOSIS — M1990 Unspecified osteoarthritis, unspecified site: Secondary | ICD-10-CM | POA: Insufficient documentation

## 2014-10-02 DIAGNOSIS — Z8719 Personal history of other diseases of the digestive system: Secondary | ICD-10-CM | POA: Diagnosis not present

## 2014-10-02 DIAGNOSIS — M25512 Pain in left shoulder: Secondary | ICD-10-CM | POA: Diagnosis present

## 2014-10-02 DIAGNOSIS — Y998 Other external cause status: Secondary | ICD-10-CM | POA: Diagnosis not present

## 2014-10-02 DIAGNOSIS — S46912A Strain of unspecified muscle, fascia and tendon at shoulder and upper arm level, left arm, initial encounter: Secondary | ICD-10-CM | POA: Diagnosis not present

## 2014-10-02 DIAGNOSIS — I129 Hypertensive chronic kidney disease with stage 1 through stage 4 chronic kidney disease, or unspecified chronic kidney disease: Secondary | ICD-10-CM | POA: Diagnosis not present

## 2014-10-02 DIAGNOSIS — X58XXXA Exposure to other specified factors, initial encounter: Secondary | ICD-10-CM | POA: Diagnosis not present

## 2014-10-02 DIAGNOSIS — N189 Chronic kidney disease, unspecified: Secondary | ICD-10-CM | POA: Diagnosis not present

## 2014-10-02 DIAGNOSIS — R079 Chest pain, unspecified: Secondary | ICD-10-CM | POA: Diagnosis not present

## 2014-10-02 DIAGNOSIS — Z950 Presence of cardiac pacemaker: Secondary | ICD-10-CM | POA: Diagnosis not present

## 2014-10-02 DIAGNOSIS — E785 Hyperlipidemia, unspecified: Secondary | ICD-10-CM | POA: Insufficient documentation

## 2014-10-02 DIAGNOSIS — M810 Age-related osteoporosis without current pathological fracture: Secondary | ICD-10-CM | POA: Insufficient documentation

## 2014-10-02 DIAGNOSIS — T148XXA Other injury of unspecified body region, initial encounter: Secondary | ICD-10-CM

## 2014-10-02 DIAGNOSIS — R0602 Shortness of breath: Secondary | ICD-10-CM | POA: Diagnosis not present

## 2014-10-02 MED ORDER — TRAMADOL HCL 50 MG PO TABS
50.0000 mg | ORAL_TABLET | Freq: Two times a day (BID) | ORAL | Status: DC | PRN
  Administered 2014-10-02: 50 mg via ORAL
  Filled 2014-10-02: qty 1

## 2014-10-02 MED ORDER — TRAMADOL HCL 50 MG PO TABS: 50.0000 mg | ORAL_TABLET | Freq: Two times a day (BID) | ORAL | Status: DC | PRN

## 2014-10-02 NOTE — ED Notes (Signed)
Cough x 3 days, today left shoulder pain

## 2014-10-02 NOTE — Discharge Instructions (Signed)
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
## 2014-10-21 ENCOUNTER — Ambulatory Visit (INDEPENDENT_AMBULATORY_CARE_PROVIDER_SITE_OTHER): Payer: Medicare Other | Admitting: Internal Medicine

## 2014-10-21 ENCOUNTER — Encounter: Payer: Self-pay | Admitting: Internal Medicine

## 2014-10-21 VITALS — BP 118/66 | HR 70 | Ht 63.0 in | Wt 163.8 lb

## 2014-10-21 DIAGNOSIS — I10 Essential (primary) hypertension: Secondary | ICD-10-CM

## 2014-10-21 DIAGNOSIS — Z950 Presence of cardiac pacemaker: Secondary | ICD-10-CM

## 2014-10-21 DIAGNOSIS — Z01818 Encounter for other preprocedural examination: Secondary | ICD-10-CM | POA: Diagnosis not present

## 2014-10-21 DIAGNOSIS — I4891 Unspecified atrial fibrillation: Secondary | ICD-10-CM | POA: Diagnosis not present

## 2014-10-21 DIAGNOSIS — I482 Chronic atrial fibrillation, unspecified: Secondary | ICD-10-CM

## 2014-10-21 LAB — CBC WITH DIFFERENTIAL/PLATELET
Basophils Absolute: 0 10*3/uL (ref 0.0–0.1)
Basophils Relative: 0 % (ref 0–1)
EOS PCT: 12 % — AB (ref 0–5)
Eosinophils Absolute: 0.9 10*3/uL — ABNORMAL HIGH (ref 0.0–0.7)
HEMATOCRIT: 43.6 % (ref 36.0–46.0)
Hemoglobin: 14.3 g/dL (ref 12.0–15.0)
LYMPHS ABS: 1.9 10*3/uL (ref 0.7–4.0)
LYMPHS PCT: 27 % (ref 12–46)
MCH: 29.5 pg (ref 26.0–34.0)
MCHC: 32.8 g/dL (ref 30.0–36.0)
MCV: 90.1 fL (ref 78.0–100.0)
MONO ABS: 0.6 10*3/uL (ref 0.1–1.0)
MPV: 11.8 fL (ref 8.6–12.4)
Monocytes Relative: 8 % (ref 3–12)
Neutro Abs: 3.8 10*3/uL (ref 1.7–7.7)
Neutrophils Relative %: 53 % (ref 43–77)
Platelets: 203 10*3/uL (ref 150–400)
RBC: 4.84 MIL/uL (ref 3.87–5.11)
RDW: 14.3 % (ref 11.5–15.5)
WBC: 7.2 10*3/uL (ref 4.0–10.5)

## 2014-10-21 LAB — BASIC METABOLIC PANEL
BUN: 23 mg/dL (ref 7–25)
CALCIUM: 9.3 mg/dL (ref 8.6–10.4)
CO2: 24 mmol/L (ref 20–31)
CREATININE: 1.69 mg/dL — AB (ref 0.60–0.88)
Chloride: 108 mmol/L (ref 98–110)
Glucose, Bld: 88 mg/dL (ref 65–99)
Potassium: 3.8 mmol/L (ref 3.5–5.3)
Sodium: 142 mmol/L (ref 135–146)

## 2014-10-21 LAB — CUP PACEART INCLINIC DEVICE CHECK
Battery Voltage: 2.38 V
Lead Channel Pacing Threshold Amplitude: 2 V
Lead Channel Pacing Threshold Pulse Width: 0.8 ms
Lead Channel Sensing Intrinsic Amplitude: 10 mV
Lead Channel Setting Pacing Pulse Width: 0.8 ms
Lead Channel Setting Sensing Sensitivity: 2 mV
MDC IDC PG SERIAL: 15732
MDC IDC SESS DTM: 20161010143335
MDC IDC SET LEADCHNL RV PACING AMPLITUDE: 4 V

## 2014-10-21 LAB — PROTIME-INR
INR: 0.98 (ref <1.50)
PROTHROMBIN TIME: 13.1 s (ref 11.6–15.2)

## 2014-10-21 NOTE — Patient Instructions (Signed)
Please follow instructions for pacemaker revision and lead change provided to you      Get lab work today      F/u as directed      Thank you for choosing [facility] !
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
## 2014-10-21 NOTE — Assessment & Plan Note (Signed)
Her blood pressure is well controlled. She will continue her current meds.  

## 2014-10-21 NOTE — Assessment & Plan Note (Signed)
Her [employer] Jude PPM is at [facility]. The lead has an elevated pacing impedence. I have recommend placing new leads with her generator change as the treatment of sinus node dysfunction is not thought best with a VVI PM.

## 2014-10-24 ENCOUNTER — Ambulatory Visit (HOSPITAL_COMMUNITY)
Admission: RE | Admit: 2014-10-24 | Discharge: 2014-10-25 | Disposition: A | Discharge status: Home / Self Care | Payer: Medicare Other | Source: Ambulatory Visit | Attending: Internal Medicine | Admitting: Internal Medicine

## 2014-10-24 ENCOUNTER — Encounter (HOSPITAL_COMMUNITY)
Admission: RE | Disposition: A | Discharge status: Home / Self Care | Payer: Self-pay | Source: Ambulatory Visit | Attending: Internal Medicine

## 2014-10-24 DIAGNOSIS — Z79899 Other long term (current) drug therapy: Secondary | ICD-10-CM | POA: Insufficient documentation

## 2014-10-24 DIAGNOSIS — M1990 Unspecified osteoarthritis, unspecified site: Secondary | ICD-10-CM | POA: Diagnosis not present

## 2014-10-24 DIAGNOSIS — I129 Hypertensive chronic kidney disease with stage 1 through stage 4 chronic kidney disease, or unspecified chronic kidney disease: Secondary | ICD-10-CM | POA: Insufficient documentation

## 2014-10-24 DIAGNOSIS — F17210 Nicotine dependence, cigarettes, uncomplicated: Secondary | ICD-10-CM | POA: Insufficient documentation

## 2014-10-24 DIAGNOSIS — N189 Chronic kidney disease, unspecified: Secondary | ICD-10-CM | POA: Diagnosis not present

## 2014-10-24 DIAGNOSIS — E785 Hyperlipidemia, unspecified: Secondary | ICD-10-CM | POA: Diagnosis not present

## 2014-10-24 DIAGNOSIS — Z45018 Encounter for adjustment and management of other part of cardiac pacemaker: Secondary | ICD-10-CM | POA: Insufficient documentation

## 2014-10-24 DIAGNOSIS — Z45010 Encounter for checking and testing of cardiac pacemaker pulse generator [battery]: Secondary | ICD-10-CM | POA: Diagnosis not present

## 2014-10-24 DIAGNOSIS — T82110A Breakdown (mechanical) of cardiac electrode, initial encounter: Secondary | ICD-10-CM | POA: Diagnosis not present

## 2014-10-24 DIAGNOSIS — Z950 Presence of cardiac pacemaker: Secondary | ICD-10-CM

## 2014-10-24 DIAGNOSIS — I495 Sick sinus syndrome: Secondary | ICD-10-CM | POA: Diagnosis not present

## 2014-10-24 HISTORY — PX: EP IMPLANTABLE DEVICE: SHX172B

## 2014-10-24 LAB — SURGICAL PCR SCREEN
MRSA, PCR: NEGATIVE
STAPHYLOCOCCUS AUREUS: NEGATIVE

## 2014-10-24 SURGERY — PPM/BIV PPM GENERATOR CHANGEOUT

## 2014-10-24 MED ORDER — ONDANSETRON HCL 4 MG/2ML IJ SOLN: 4.0000 mg | Freq: Four times a day (QID) | INTRAMUSCULAR | Status: DC | PRN

## 2014-10-24 MED ORDER — GUAIFENESIN 100 MG/5ML PO LIQD: 200.0000 mg | Freq: Three times a day (TID) | ORAL | Status: DC | PRN

## 2014-10-24 MED ORDER — MUPIROCIN 2 % EX OINT
1.0000 "application " | TOPICAL_OINTMENT | Freq: Once | CUTANEOUS | Status: AC
  Administered 2014-10-24: 1 via TOPICAL
  Filled 2014-10-24: qty 22

## 2014-10-24 MED ORDER — SODIUM CHLORIDE 0.9 % IV SOLN
INTRAVENOUS | Status: DC
  Administered 2014-10-24: 1000 mL via INTRAVENOUS

## 2014-10-24 MED ORDER — SODIUM CHLORIDE 0.9 % IR SOLN
80.0000 mg | Status: AC
  Administered 2014-10-24: 80 mg

## 2014-10-24 MED ORDER — CEFAZOLIN SODIUM-DEXTROSE 2-3 GM-% IV SOLR
2.0000 g | INTRAVENOUS | Status: AC
  Administered 2014-10-24: 2 g via INTRAVENOUS

## 2014-10-24 MED ORDER — IOHEXOL 350 MG/ML SOLN
INTRAVENOUS | Status: DC | PRN
  Administered 2014-10-24: 10 mL via INTRAVENOUS

## 2014-10-24 MED ORDER — HEPARIN (PORCINE) IN NACL 2-0.9 UNIT/ML-% IJ SOLN
INTRAMUSCULAR | Status: DC | PRN
  Administered 2014-10-24: 16:00:00

## 2014-10-24 MED ORDER — ACETAMINOPHEN 325 MG PO TABS: 325.0000 mg | ORAL_TABLET | ORAL | Status: DC | PRN

## 2014-10-24 MED ORDER — HEPARIN (PORCINE) IN NACL 2-0.9 UNIT/ML-% IJ SOLN
INTRAMUSCULAR | Status: AC
  Filled 2014-10-24: qty 500

## 2014-10-24 MED ORDER — MUPIROCIN 2 % EX OINT
TOPICAL_OINTMENT | CUTANEOUS | Status: AC
  Administered 2014-10-24: 1 via TOPICAL
  Filled 2014-10-24: qty 22

## 2014-10-24 MED ORDER — MIDAZOLAM HCL 5 MG/5ML IJ SOLN
INTRAMUSCULAR | Status: AC
  Filled 2014-10-24: qty 25

## 2014-10-24 MED ORDER — MIDAZOLAM HCL 5 MG/5ML IJ SOLN
INTRAMUSCULAR | Status: DC | PRN
  Administered 2014-10-24 (×9): 1 mg via INTRAVENOUS

## 2014-10-24 MED ORDER — BENZONATATE 100 MG PO CAPS: 200.0000 mg | ORAL_CAPSULE | Freq: Three times a day (TID) | ORAL | Status: DC | PRN

## 2014-10-24 MED ORDER — LIDOCAINE HCL (PF) 1 % IJ SOLN
INTRAMUSCULAR | Status: AC
  Filled 2014-10-24: qty 30

## 2014-10-24 MED ORDER — FENTANYL CITRATE (PF) 100 MCG/2ML IJ SOLN
INTRAMUSCULAR | Status: DC | PRN
Start: 2014-10-24 — End: 2014-10-24
  Administered 2014-10-24 (×7): 12.5 ug via INTRAVENOUS

## 2014-10-24 MED ORDER — SIMVASTATIN 20 MG PO TABS
20.0000 mg | ORAL_TABLET | Freq: Every day | ORAL | Status: DC
  Administered 2014-10-24: 20 mg via ORAL
  Filled 2014-10-24: qty 1

## 2014-10-24 MED ORDER — CEFAZOLIN SODIUM 1-5 GM-% IV SOLN
1.0000 g | Freq: Four times a day (QID) | INTRAVENOUS | Status: AC
  Administered 2014-10-24 – 2014-10-25 (×3): 1 g via INTRAVENOUS
  Filled 2014-10-24 (×3): qty 50

## 2014-10-24 MED ORDER — CEFAZOLIN SODIUM-DEXTROSE 2-3 GM-% IV SOLR
INTRAVENOUS | Status: AC
  Filled 2014-10-24: qty 50

## 2014-10-24 MED ORDER — GUAIFENESIN 100 MG/5ML PO SOLN
10.0000 mL | Freq: Three times a day (TID) | ORAL | Status: DC | PRN
  Filled 2014-10-24: qty 10

## 2014-10-24 MED ORDER — SODIUM CHLORIDE 0.9 % IV SOLN
INTRAVENOUS | Status: DC
  Administered 2014-10-24 (×2): 1000 mL via INTRAVENOUS

## 2014-10-24 MED ORDER — SODIUM CHLORIDE 0.9 % IR SOLN
Status: AC
  Filled 2014-10-24: qty 2

## 2014-10-24 MED ORDER — AMLODIPINE BESYLATE 10 MG PO TABS
10.0000 mg | ORAL_TABLET | Freq: Every day | ORAL | Status: DC
  Administered 2014-10-24 – 2014-10-25 (×2): 10 mg via ORAL
  Filled 2014-10-24 (×2): qty 1

## 2014-10-24 MED ORDER — LIDOCAINE HCL (PF) 1 % IJ SOLN
INTRAMUSCULAR | Status: DC | PRN
  Administered 2014-10-24: 35 mL via INTRADERMAL

## 2014-10-24 MED ORDER — FENTANYL CITRATE (PF) 100 MCG/2ML IJ SOLN
INTRAMUSCULAR | Status: AC
  Filled 2014-10-24: qty 4

## 2014-10-24 MED ORDER — TRAMADOL HCL 50 MG PO TABS: 50.0000 mg | ORAL_TABLET | Freq: Four times a day (QID) | ORAL | Status: DC | PRN

## 2014-10-24 SURGICAL SUPPLY — 8 items
CABLE SURGICAL S-101-97-12 (CABLE) ×2 IMPLANT
GUIDEWIRE ANGLED .035X150CM (WIRE) ×4 IMPLANT
LEAD TENDRIL SDX 2088TC-46CM (Lead) ×2 IMPLANT
LEAD TENDRIL SDX 2088TC-52CM (Lead) ×2 IMPLANT
PAD DEFIB LIFELINK (PAD) ×2 IMPLANT
PPM ASSURITY DR PM2240 (Pacemaker) ×2 IMPLANT
SHEATH CLASSIC 7F (SHEATH) ×4 IMPLANT
TRAY PACEMAKER INSERTION (CUSTOM PROCEDURE TRAY) ×2 IMPLANT

## 2014-10-24 NOTE — Discharge Instructions (Signed)
° ° °  Supplemental Discharge Instructions for  Pacemaker/Defibrillator Patients  Activity No heavy lifting or vigorous activity with your left/right arm for 6 to 8 weeks.  Do not raise your left/right arm above your head for one week.  Gradually raise your affected arm as drawn below.           __    10-29-14                  10-30-14             10-31-14                 11-01-14   NO DRIVING for  1 week   ; you may begin driving on  [id number]   .  WOUND CARE - Keep the wound area clean and dry.  Do not get this area wet for one week. No showers for one week; you may shower on   11-01-14  . - The tape/steri-strips on your wound will fall off; do not pull them off.  No bandage is needed on the site.  DO  NOT apply any creams, oils, or ointments to the wound area. - If you notice any drainage or discharge from the wound, any swelling or bruising at the site, or you develop a fever > 101? F after you are discharged home, call the office at once.  Special Instructions - You are still able to use cellular telephones; use the ear opposite the side where you have your pacemaker/defibrillator.  Avoid carrying your cellular phone near your device. - When traveling through airports, show security personnel your identification card to avoid being screened in the metal detectors.  Ask the security personnel to use the hand wand. - Avoid arc welding equipment, MRI testing (magnetic resonance imaging), TENS units (transcutaneous nerve stimulators).  Call the office for questions about other devices. - Avoid electrical appliances that are in poor condition or are not properly grounded. - Microwave ovens are safe to be near or to operate.

## 2014-10-24 NOTE — Interval H&P Note (Signed)
History and Physical Interval Note:  10/24/2014 12:48 PM  Ashley Olsen  has presented today for surgery, with the diagnosis of eol / afib  The various methods of treatment have been discussed with the patient and family. After consideration of risks, benefits and other options for treatment, the patient has consented to  Procedure(s): PPM Generator Changeout (N/A) Lead Revision (N/A) as a surgical intervention .  The patient's history has been reviewed, patient examined, no change in status, stable for surgery.  I have reviewed the patient's chart and labs.  Questions were answered to the patient's satisfaction.     Cristopher Peru
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
## 2014-10-24 NOTE — Discharge Summary (Signed)
ELECTROPHYSIOLOGY PROCEDURE DISCHARGE SUMMARY    Patient ID: Ashley Olsen,  MRN: [id number], DOB/AGE: 1932-10-15 81 y.o.  Admit date: 10/24/2014 Discharge date: 10/25/2014  Primary Care Physician: Purvis Kilts, MD Electrophysiologist: Lovena Le  Primary Discharge Diagnosis:  Sinus bradycardia with PPM at [facility], previously fractured RA lead and increasing RV lead threshold s/p PPM gen change and implantation of new RA and RV lead this admission  Secondary Discharge Diagnosis:  1.  Hypertension 2.  Hyperlipidemia 3.  CKD 4.  Osteoporosis  Allergies  Allergen Reactions  . Codeine Nausea And Vomiting  . Sulfonamide Derivatives Other (See Comments)    Tongue rash     Procedures This Admission:  1.  STJ PPM gen change with addition of new RA and RV lead on 10-24-14 by Dr Lovena Le. See op note for full details. There were no immediate post procedure complications. 2.  CXR on 10-25-14 demonstrated no pneumothorax status post device implantation.   Brief HPI: Ashley Olsen is a 81 y.o. female with a past medical history as outlined above and is s/p PPM implantation.  Her PPM has recently reached ERI, previously implanted RA lead was fractured and her RV lead threshold has been increasing over time.   Risks, benefits, and alternatives to PPM gen change and RV lead revision were reviewed with the patient who wished to proceed.   Hospital Course:  The patient was admitted and underwent implantation of a STJ dual chamber pacemaker with new RA and RV lead with details as outlined above.  She  was monitored on telemetry overnight which demonstrated atrial pacing with intrinsic ventricular conduction.  Right chest was without hematoma or ecchymosis.  The device was interrogated and found to be functioning normally.  CXR was obtained and demonstrated no pneumothorax status post device implantation.  Wound care, arm mobility, and restrictions were reviewed with the patient.  The  patient was examined and considered stable for discharge to home.    Physical Exam: Filed Vitals:   10/24/14 1740 10/24/14 1807 10/24/14 1900 10/25/14 0500  BP:  127/78 127/76 123/68  Pulse: 0 63 64 63  Temp:  97.4 F (36.3 C) 97.8 F (36.6 C) 98.7 F (37.1 C)  TempSrc:  Oral Oral Oral  Resp: 0 19 13 15   Height:      Weight:      SpO2: 98% 96% 98% 94%    GEN- The patient is well appearing, alert and oriented x 3 today.   HEENT: normocephalic, atraumatic; sclera clear, conjunctiva pink; hearing intact; oropharynx clear; neck supple  Lungs- Clear to ausculation bilaterally, normal work of breathing.  No wheezes, rales, rhonchi Heart- Regular rate and rhythm  GI- soft, non-tender, non-distended, bowel sounds present  Extremities- no clubbing, cyanosis, or edema; DP/PT/radial pulses 2+ bilaterally MS- no significant deformity or atrophy Skin- warm and dry, no rash or lesion, right chest without hematoma/ecchymosis Psych- euthymic mood, full affect Neuro- strength and sensation are intact   Labs:   Lab Results  Component Value Date   WBC 7.2 10/21/2014   HGB 14.3 10/21/2014   HCT 43.6 10/21/2014   MCV 90.1 10/21/2014   PLT 203 10/21/2014     Recent Labs Lab 10/21/14 0946  NA 142  K 3.8  CL 108  CO2 24  BUN 23  CREATININE 1.69*  CALCIUM 9.3  GLUCOSE 88    Discharge Medications:    Medication List    TAKE these medications  amLODipine 10 MG tablet  Commonly known as:  NORVASC  Take 10 mg by mouth daily.     benzonatate 200 MG capsule  Commonly known as:  TESSALON  Take 200 mg by mouth 3 (three) times daily as needed for cough.     simvastatin 20 MG tablet  Commonly known as:  ZOCOR  Take 20 mg by mouth at bedtime.     traMADol 50 MG tablet  Commonly known as:  ULTRAM  Take 1 tablet (50 mg total) by mouth every 6 (six) hours as needed (pain).     TUSSIN 100 MG/5ML liquid  Generic drug:  guaiFENesin  Take 200 mg by mouth 3 (three) times  daily as needed for cough.        Disposition:  Discharge Instructions    Diet - low sodium heart healthy    Complete by:  As directed      Increase activity slowly    Complete by:  As directed           Follow-up Information    Follow up with [facility] On 11/04/2014.   Why:  at 11AM for wound check   Contact information:   [address] [place] [phone]       Follow up with Cristopher Peru, MD On 01/24/2015.   Specialty:  Cardiology   Why:  at 9:45AM   Contact information:   [address] [place] [address] [phone]       Duration of Discharge Encounter: Greater than 30 minutes including physician time.  Signed, Chanetta Marshall, NP 10/25/2014 6:37 AM   EP attending  Patient seen and examined. Agree with the findings as documented above. She is doing well s/p insertion of a new DDD pacing system and removal of her old PM generator. Usual followup.   Mikle Bosworth.D.

## 2014-10-25 ENCOUNTER — Ambulatory Visit (HOSPITAL_COMMUNITY): Payer: Medicare Other

## 2014-10-25 ENCOUNTER — Encounter (HOSPITAL_COMMUNITY): Payer: Self-pay | Admitting: Internal Medicine

## 2014-10-25 DIAGNOSIS — Z950 Presence of cardiac pacemaker: Secondary | ICD-10-CM

## 2014-10-25 DIAGNOSIS — F17210 Nicotine dependence, cigarettes, uncomplicated: Secondary | ICD-10-CM | POA: Diagnosis not present

## 2014-10-25 DIAGNOSIS — N189 Chronic kidney disease, unspecified: Secondary | ICD-10-CM | POA: Diagnosis not present

## 2014-10-25 DIAGNOSIS — I129 Hypertensive chronic kidney disease with stage 1 through stage 4 chronic kidney disease, or unspecified chronic kidney disease: Secondary | ICD-10-CM | POA: Diagnosis not present

## 2014-10-25 DIAGNOSIS — E785 Hyperlipidemia, unspecified: Secondary | ICD-10-CM | POA: Diagnosis not present

## 2014-10-25 DIAGNOSIS — Z79899 Other long term (current) drug therapy: Secondary | ICD-10-CM | POA: Diagnosis not present

## 2014-10-25 DIAGNOSIS — Z45018 Encounter for adjustment and management of other part of cardiac pacemaker: Secondary | ICD-10-CM | POA: Diagnosis not present

## 2014-10-25 MED ORDER — TRAMADOL HCL 50 MG PO TABS: 50.0000 mg | ORAL_TABLET | Freq: Four times a day (QID) | ORAL | Status: DC | PRN

## 2014-11-04 ENCOUNTER — Ambulatory Visit (INDEPENDENT_AMBULATORY_CARE_PROVIDER_SITE_OTHER): Payer: Medicare Other | Admitting: *Deleted

## 2014-11-04 DIAGNOSIS — I495 Sick sinus syndrome: Secondary | ICD-10-CM | POA: Diagnosis not present

## 2014-11-04 DIAGNOSIS — Z950 Presence of cardiac pacemaker: Secondary | ICD-10-CM

## 2014-11-04 LAB — CUP PACEART INCLINIC DEVICE CHECK
Battery Remaining Longevity: 92.4
Brady Statistic RA Percent Paced: 64 %
Brady Statistic RV Percent Paced: 0.91 %
Date Time Interrogation Session: 20161024113525
Implantable Lead Implant Date: 20161013
Implantable Lead Location: 753859
Implantable Lead Location: 753860
Lead Channel Impedance Value: 650 Ohm
Lead Channel Pacing Threshold Amplitude: 0.75 V
Lead Channel Pacing Threshold Pulse Width: 0.5 ms
Lead Channel Sensing Intrinsic Amplitude: 2.1 mV
Lead Channel Setting Pacing Amplitude: 3.5 V
Lead Channel Setting Pacing Pulse Width: 0.5 ms
MDC IDC LEAD IMPLANT DT: 20161013
MDC IDC MSMT BATTERY VOLTAGE: 3.08 V
MDC IDC MSMT LEADCHNL RA IMPEDANCE VALUE: 412.5 Ohm
MDC IDC MSMT LEADCHNL RA PACING THRESHOLD AMPLITUDE: 0.75 V
MDC IDC MSMT LEADCHNL RA PACING THRESHOLD AMPLITUDE: 0.75 V
MDC IDC MSMT LEADCHNL RA PACING THRESHOLD PULSEWIDTH: 0.5 ms
MDC IDC MSMT LEADCHNL RV PACING THRESHOLD AMPLITUDE: 0.75 V
MDC IDC MSMT LEADCHNL RV PACING THRESHOLD PULSEWIDTH: 0.5 ms
MDC IDC MSMT LEADCHNL RV PACING THRESHOLD PULSEWIDTH: 0.5 ms
MDC IDC MSMT LEADCHNL RV SENSING INTR AMPL: 12 mV
MDC IDC PG SERIAL: 7820884
MDC IDC SET LEADCHNL RA PACING AMPLITUDE: 3.5 V
MDC IDC SET LEADCHNL RV SENSING SENSITIVITY: 2 mV

## 2014-11-04 NOTE — Progress Notes (Signed)
Changeout wound check appointment. Steri-strips removed. Wound without redness or edema. Incision edges approximated, wound well healed. Normal device function. Thresholds, sensing consistent with implant measurements. RA impedance consistent, RV impedance decreased---remeasured, consistently in mid 600s (implant was 910ohms). Device programmed at 3.5V/auto capture programmed on for extra safety margin until 3 month visit. Histogram distribution appropriate for patient and level of activity. No mode switches or high ventricular rates noted. Patient educated about wound care, arm mobility, lifting restrictions. ROV w/ [facility]/[place] 01/24/15.

## 2014-11-13 ENCOUNTER — Encounter: Payer: Self-pay | Admitting: Internal Medicine

## 2014-11-19 ENCOUNTER — Encounter: Payer: Self-pay | Admitting: Internal Medicine

## 2014-11-22 DIAGNOSIS — E782 Mixed hyperlipidemia: Secondary | ICD-10-CM | POA: Diagnosis not present

## 2014-11-22 DIAGNOSIS — E559 Vitamin D deficiency, unspecified: Secondary | ICD-10-CM | POA: Diagnosis not present

## 2014-11-22 DIAGNOSIS — E6609 Other obesity due to excess calories: Secondary | ICD-10-CM | POA: Diagnosis not present

## 2014-11-22 DIAGNOSIS — N184 Chronic kidney disease, stage 4 (severe): Secondary | ICD-10-CM | POA: Diagnosis not present

## 2014-11-22 DIAGNOSIS — Z1389 Encounter for screening for other disorder: Secondary | ICD-10-CM | POA: Diagnosis not present

## 2014-11-22 DIAGNOSIS — Z6830 Body mass index (BMI) 30.0-30.9, adult: Secondary | ICD-10-CM | POA: Diagnosis not present

## 2014-11-22 DIAGNOSIS — E538 Deficiency of other specified B group vitamins: Secondary | ICD-10-CM | POA: Diagnosis not present

## 2014-11-22 DIAGNOSIS — T148 Other injury of unspecified body region: Secondary | ICD-10-CM | POA: Diagnosis not present

## 2014-12-16 DIAGNOSIS — D510 Vitamin B12 deficiency anemia due to intrinsic factor deficiency: Secondary | ICD-10-CM | POA: Diagnosis not present

## 2015-01-06 DIAGNOSIS — J101 Influenza due to other identified influenza virus with other respiratory manifestations: Secondary | ICD-10-CM | POA: Diagnosis not present

## 2015-01-06 DIAGNOSIS — J069 Acute upper respiratory infection, unspecified: Secondary | ICD-10-CM | POA: Diagnosis not present

## 2015-01-07 ENCOUNTER — Ambulatory Visit: Payer: Medicare Other | Admitting: Orthopedic Surgery

## 2015-01-08 ENCOUNTER — Encounter (HOSPITAL_COMMUNITY): Payer: Self-pay | Admitting: Emergency Medicine

## 2015-01-08 ENCOUNTER — Emergency Department (HOSPITAL_COMMUNITY)
Admission: EM | Admit: 2015-01-08 | Discharge: 2015-01-08 | Disposition: A | Discharge status: Home / Self Care | Payer: Medicare Other | Attending: Emergency Medicine | Admitting: Emergency Medicine

## 2015-01-08 ENCOUNTER — Emergency Department (HOSPITAL_COMMUNITY): Payer: Medicare Other

## 2015-01-08 DIAGNOSIS — F17210 Nicotine dependence, cigarettes, uncomplicated: Secondary | ICD-10-CM | POA: Diagnosis not present

## 2015-01-08 DIAGNOSIS — J449 Chronic obstructive pulmonary disease, unspecified: Secondary | ICD-10-CM | POA: Diagnosis not present

## 2015-01-08 DIAGNOSIS — N189 Chronic kidney disease, unspecified: Secondary | ICD-10-CM | POA: Insufficient documentation

## 2015-01-08 DIAGNOSIS — Z862 Personal history of diseases of the blood and blood-forming organs and certain disorders involving the immune mechanism: Secondary | ICD-10-CM | POA: Insufficient documentation

## 2015-01-08 DIAGNOSIS — I129 Hypertensive chronic kidney disease with stage 1 through stage 4 chronic kidney disease, or unspecified chronic kidney disease: Secondary | ICD-10-CM | POA: Diagnosis not present

## 2015-01-08 DIAGNOSIS — Z950 Presence of cardiac pacemaker: Secondary | ICD-10-CM | POA: Diagnosis not present

## 2015-01-08 DIAGNOSIS — R05 Cough: Secondary | ICD-10-CM | POA: Diagnosis present

## 2015-01-08 DIAGNOSIS — Z8739 Personal history of other diseases of the musculoskeletal system and connective tissue: Secondary | ICD-10-CM | POA: Diagnosis not present

## 2015-01-08 DIAGNOSIS — Z9889 Other specified postprocedural states: Secondary | ICD-10-CM | POA: Diagnosis not present

## 2015-01-08 DIAGNOSIS — E785 Hyperlipidemia, unspecified: Secondary | ICD-10-CM | POA: Insufficient documentation

## 2015-01-08 DIAGNOSIS — Z8719 Personal history of other diseases of the digestive system: Secondary | ICD-10-CM | POA: Insufficient documentation

## 2015-01-08 DIAGNOSIS — Z792 Long term (current) use of antibiotics: Secondary | ICD-10-CM | POA: Diagnosis not present

## 2015-01-08 DIAGNOSIS — J40 Bronchitis, not specified as acute or chronic: Secondary | ICD-10-CM | POA: Diagnosis not present

## 2015-01-08 DIAGNOSIS — Z86718 Personal history of other venous thrombosis and embolism: Secondary | ICD-10-CM | POA: Diagnosis not present

## 2015-01-08 DIAGNOSIS — Z79899 Other long term (current) drug therapy: Secondary | ICD-10-CM | POA: Insufficient documentation

## 2015-01-08 DIAGNOSIS — Z7952 Long term (current) use of systemic steroids: Secondary | ICD-10-CM | POA: Insufficient documentation

## 2015-01-08 LAB — BASIC METABOLIC PANEL
ANION GAP: 9 (ref 5–15)
BUN: 32 mg/dL — AB (ref 6–20)
CO2: 22 mmol/L (ref 22–32)
Calcium: 8.8 mg/dL — ABNORMAL LOW (ref 8.9–10.3)
Chloride: 110 mmol/L (ref 101–111)
Creatinine, Ser: 2.02 mg/dL — ABNORMAL HIGH (ref 0.44–1.00)
GFR calc Af Amer: 26 mL/min — ABNORMAL LOW (ref >60)
GFR, EST NON AFRICAN AMERICAN: 22 mL/min — AB (ref >60)
Glucose, Bld: 102 mg/dL — ABNORMAL HIGH (ref 65–99)
POTASSIUM: 3.9 mmol/L (ref 3.5–5.1)
SODIUM: 141 mmol/L (ref 135–145)

## 2015-01-08 LAB — CBC WITH DIFFERENTIAL/PLATELET
BASOS ABS: 0 10*3/uL (ref 0.0–0.1)
Basophils Relative: 0 %
EOS ABS: 0 10*3/uL (ref 0.0–0.7)
EOS PCT: 0 %
HCT: 38.4 % (ref 36.0–46.0)
Hemoglobin: 12.9 g/dL (ref 12.0–15.0)
Lymphocytes Relative: 8 %
Lymphs Abs: 0.7 10*3/uL (ref 0.7–4.0)
MCH: 30.1 pg (ref 26.0–34.0)
MCHC: 33.6 g/dL (ref 30.0–36.0)
MCV: 89.5 fL (ref 78.0–100.0)
Monocytes Absolute: 0.6 10*3/uL (ref 0.1–1.0)
Monocytes Relative: 7 %
Neutro Abs: 7.8 10*3/uL — ABNORMAL HIGH (ref 1.7–7.7)
Neutrophils Relative %: 85 %
PLATELETS: 162 10*3/uL (ref 150–400)
RBC: 4.29 MIL/uL (ref 3.87–5.11)
RDW: 13.2 % (ref 11.5–15.5)
WBC: 9.1 10*3/uL (ref 4.0–10.5)

## 2015-01-08 MED ORDER — ALBUTEROL SULFATE HFA 108 (90 BASE) MCG/ACT IN AERS
1.0000 | INHALATION_SPRAY | RESPIRATORY_TRACT | Status: DC | PRN
  Administered 2015-01-08: 1 via RESPIRATORY_TRACT
  Filled 2015-01-08: qty 6.7

## 2015-01-08 NOTE — ED Notes (Signed)
Patient states she went to Urgent Care two days ago and had a chest x-ray and was told she had pneumonia. States "I called them today about an inhaler and I am still short of breath and they told me to come to the hospital for admission."
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
## 2015-01-08 NOTE — ED Notes (Signed)
Inhaler given, teachback, return demonstration

## 2015-01-08 NOTE — Discharge Instructions (Signed)
Chronic Bronchitis Chronic bronchitis is a lasting inflammation of the bronchial tubes, which are the tubes that carry air into your lungs. This is inflammation that occurs:   On most days of the week.   For at least three months at a time.   Over a period of two years in a row. When the bronchial tubes are inflamed, they start to produce mucus. The inflammation and buildup of mucus make it more difficult to breathe. Chronic bronchitis is usually a permanent problem and is one type of chronic obstructive pulmonary disease (COPD). People with chronic bronchitis are at greater risk for getting repeated colds, or respiratory infections. CAUSES  Chronic bronchitis most often occurs in people who have:  Long-standing, severe asthma.  A history of smoking.  Asthma and who also smoke. SIGNS AND SYMPTOMS  Chronic bronchitis may cause the following:   A cough that brings up mucus (productive cough).  Shortness of breath.  Early morning headache.  Wheezing.  Chest discomfort.   Recurring respiratory infections. DIAGNOSIS  Your health care provider may confirm the diagnosis by:  Taking your medical history.  Performing a physical exam.  Taking a chest X-ray.   Performing pulmonary function tests. TREATMENT  Treatment involves controlling symptoms with medicines, oxygen therapy, or making lifestyle changes, such as exercising and eating a healthy, well-balanced diet. Medicines could include:  Inhalers to improve air flow in and out of your lungs.  Antibiotics to treat bacterial infections, such as pneumonia, sinus infections, and acute bronchitis. As a preventative measure, your health care provider may recommend routine vaccinations for influenza and pneumonia. This is to prevent infection and hospitalization since you may be more at risk for these types of infections.  HOME CARE INSTRUCTIONS  Take medicines only as directed by your health care provider.   If you smoke  cigarettes, chew tobacco, or use electronic cigarettes, quit. If you need help quitting, ask your health care provider.  Avoid pollen, dust, animal dander, molds, smoke, and other things that cause shortness of breath or wheezing attacks.  Talk to your health care provider about possible exercise routines. Regular exercise is very important to help you feel better.  If you are prescribed oxygen use at home follow these guidelines:  Never smoke while using oxygen. Oxygen does not burn or explode, but flammable materials will burn faster in the presence of oxygen.  Keep a Data processing manager close by. Let your fire department know that you have oxygen in your home.  Warn visitors not to smoke near you when you are using oxygen. Put up "no smoking" signs in your home where you most often use the oxygen.  Regularly test your smoke detectors at home to make sure they work. If you receive care in your home from a nurse or other health care provider, he or she may also check to make sure your smoke detectors work.  Ask your health care provider whether you would benefit from a pulmonary rehabilitation program.  Do not wait to get medical care if you have any concerning symptoms. Delays could cause permanent injury and may be life threatening. SEEK MEDICAL CARE IF:  You have increased coughing or shortness of breath or both.  You have muscle aches.  You have chest pain.  Your mucus gets thicker.  Your mucus changes from clear or white to yellow, green, gray, or bloody. SEEK IMMEDIATE MEDICAL CARE IF:  Your usual medicines do not stop your wheezing.   You have increased difficulty breathing.  You have any problems with the medicine you are taking, such as a rash, itching, swelling, or trouble breathing. MAKE SURE YOU:   Understand these instructions.  Will watch your condition.  Will get help right away if you are not doing well or get worse.   This information is not intended to  replace advice given to you by your health care provider. Make sure you discuss any questions you have with your health care provider.   Document Released: 10/15/2005 Document Revised: 01/18/2014 Document Reviewed: 02/05/2013 Elsevier Interactive Patient Education 2016 Else[employer]  Chronic Kidney Disease Chronic kidney disease happens when the kidneys are damaged over a long period. The kidneys are two organs that do many important jobs in the body. These jobs include:  Removing wastes and extra fluids from the blood.  Making hormones that help to keep the body healthy.  Making sure that the body has the right amount of fluids and chemicals. Chronic kidney disease may be caused by many things. The kidney damage occurs slowly. If too much damage occurs, the kidneys may stop working the way that they should. This is dangerous. Treatment can help to slow down the damage and keep it from getting worse. HOME CARE  Follow your diet as told by your doctor. You may need to limit the amount of salt (sodium) and protein that you eat each day.  Take medicines only as told by your doctor. Do not take any new medicines unless your doctor approves it.  Quit smoking if you smoke. Talk to your doctor about programs that may help you quit smoking.  Have your blood pressure checked regularly and keep track of the results.  Start or keep doing an exercise plan.  Get shots (immunizations) as told by your doctor.  Take vitamins and minerals as told by your doctor.  Keep all follow-up visits as told by your doctor. This is important. GET HELP RIGHT AWAY IF:   Your symptoms get worse.  You have new symptoms.  You have symptoms of end-stage kidney disease. These include:  Headaches.  Skin that is darker or lighter than normal.  Numbness in the hands or feet.  Easy bruising.  Frequent hiccups.  Stopping of menstrual periods in women.  You have a fever.  You are making very little pee  (urine).  You have pain or bleeding when you pee.   This information is not intended to replace advice given to you by your health care provider. Make sure you discuss any questions you have with your health care provider.   Document Released: 03/24/2009 Document Revised: 09/18/2014 Document Reviewed: 08/27/2011 Elsevier Interactive Patient Education [employer].

## 2015-01-14 ENCOUNTER — Emergency Department (HOSPITAL_COMMUNITY)
Admission: EM | Admit: 2015-01-14 | Discharge: 2015-01-14 | Disposition: A | Discharge status: Home / Self Care | Payer: Medicare Other | Attending: Emergency Medicine | Admitting: Emergency Medicine

## 2015-01-14 ENCOUNTER — Encounter (HOSPITAL_COMMUNITY): Payer: Self-pay | Admitting: Emergency Medicine

## 2015-01-14 ENCOUNTER — Emergency Department (HOSPITAL_COMMUNITY): Payer: Medicare Other

## 2015-01-14 DIAGNOSIS — Z792 Long term (current) use of antibiotics: Secondary | ICD-10-CM | POA: Insufficient documentation

## 2015-01-14 DIAGNOSIS — Z8719 Personal history of other diseases of the digestive system: Secondary | ICD-10-CM | POA: Diagnosis not present

## 2015-01-14 DIAGNOSIS — I129 Hypertensive chronic kidney disease with stage 1 through stage 4 chronic kidney disease, or unspecified chronic kidney disease: Secondary | ICD-10-CM | POA: Diagnosis not present

## 2015-01-14 DIAGNOSIS — Z950 Presence of cardiac pacemaker: Secondary | ICD-10-CM | POA: Diagnosis not present

## 2015-01-14 DIAGNOSIS — Z862 Personal history of diseases of the blood and blood-forming organs and certain disorders involving the immune mechanism: Secondary | ICD-10-CM | POA: Insufficient documentation

## 2015-01-14 DIAGNOSIS — Z7952 Long term (current) use of systemic steroids: Secondary | ICD-10-CM | POA: Diagnosis not present

## 2015-01-14 DIAGNOSIS — J449 Chronic obstructive pulmonary disease, unspecified: Secondary | ICD-10-CM | POA: Diagnosis not present

## 2015-01-14 DIAGNOSIS — R05 Cough: Secondary | ICD-10-CM | POA: Diagnosis not present

## 2015-01-14 DIAGNOSIS — E538 Deficiency of other specified B group vitamins: Secondary | ICD-10-CM | POA: Diagnosis not present

## 2015-01-14 DIAGNOSIS — E785 Hyperlipidemia, unspecified: Secondary | ICD-10-CM | POA: Diagnosis not present

## 2015-01-14 DIAGNOSIS — N189 Chronic kidney disease, unspecified: Secondary | ICD-10-CM | POA: Diagnosis not present

## 2015-01-14 DIAGNOSIS — Z79899 Other long term (current) drug therapy: Secondary | ICD-10-CM | POA: Insufficient documentation

## 2015-01-14 DIAGNOSIS — I120 Hypertensive chronic kidney disease with stage 5 chronic kidney disease or end stage renal disease: Secondary | ICD-10-CM | POA: Diagnosis not present

## 2015-01-14 DIAGNOSIS — F17210 Nicotine dependence, cigarettes, uncomplicated: Secondary | ICD-10-CM | POA: Diagnosis not present

## 2015-01-14 DIAGNOSIS — Z8739 Personal history of other diseases of the musculoskeletal system and connective tissue: Secondary | ICD-10-CM | POA: Diagnosis not present

## 2015-01-14 DIAGNOSIS — N186 End stage renal disease: Secondary | ICD-10-CM | POA: Diagnosis not present

## 2015-01-14 LAB — CBC WITH DIFFERENTIAL/PLATELET
BASOS PCT: 0 %
Basophils Absolute: 0 10*3/uL (ref 0.0–0.1)
EOS ABS: 0.4 10*3/uL (ref 0.0–0.7)
Eosinophils Relative: 3 %
HEMATOCRIT: 41.2 % (ref 36.0–46.0)
HEMOGLOBIN: 13.5 g/dL (ref 12.0–15.0)
LYMPHS ABS: 2.2 10*3/uL (ref 0.7–4.0)
Lymphocytes Relative: 18 %
MCH: 29.5 pg (ref 26.0–34.0)
MCHC: 32.8 g/dL (ref 30.0–36.0)
MCV: 90.2 fL (ref 78.0–100.0)
MONO ABS: 0.9 10*3/uL (ref 0.1–1.0)
MONOS PCT: 8 %
NEUTROS ABS: 8.5 10*3/uL — AB (ref 1.7–7.7)
NEUTROS PCT: 71 %
Platelets: 149 10*3/uL — ABNORMAL LOW (ref 150–400)
RBC: 4.57 MIL/uL (ref 3.87–5.11)
RDW: 13.9 % (ref 11.5–15.5)
WBC: 12 10*3/uL — ABNORMAL HIGH (ref 4.0–10.5)

## 2015-01-14 LAB — BRAIN NATRIURETIC PEPTIDE: B NATRIURETIC PEPTIDE 5: 33 pg/mL (ref 0.0–100.0)

## 2015-01-14 LAB — COMPREHENSIVE METABOLIC PANEL
ALBUMIN: 3.4 g/dL — AB (ref 3.5–5.0)
ALK PHOS: 62 U/L (ref 38–126)
ALT: 17 U/L (ref 14–54)
ANION GAP: 7 (ref 5–15)
AST: 18 U/L (ref 15–41)
BILIRUBIN TOTAL: 0.7 mg/dL (ref 0.3–1.2)
BUN: 29 mg/dL — ABNORMAL HIGH (ref 6–20)
CALCIUM: 9 mg/dL (ref 8.9–10.3)
CO2: 25 mmol/L (ref 22–32)
Chloride: 111 mmol/L (ref 101–111)
Creatinine, Ser: 2.05 mg/dL — ABNORMAL HIGH (ref 0.44–1.00)
GFR, EST AFRICAN AMERICAN: 25 mL/min — AB (ref >60)
GFR, EST NON AFRICAN AMERICAN: 22 mL/min — AB (ref >60)
Glucose, Bld: 96 mg/dL (ref 65–99)
POTASSIUM: 3.8 mmol/L (ref 3.5–5.1)
Sodium: 143 mmol/L (ref 135–145)
TOTAL PROTEIN: 6.1 g/dL — AB (ref 6.5–8.1)

## 2015-01-14 MED ORDER — ALBUTEROL SULFATE HFA 108 (90 BASE) MCG/ACT IN AERS: 2.0000 | INHALATION_SPRAY | Freq: Four times a day (QID) | RESPIRATORY_TRACT | Status: DC | PRN

## 2015-01-14 MED ORDER — IPRATROPIUM-ALBUTEROL 0.5-2.5 (3) MG/3ML IN SOLN
3.0000 mL | Freq: Once | RESPIRATORY_TRACT | Status: AC
  Administered 2015-01-14: 3 mL via RESPIRATORY_TRACT
  Filled 2015-01-14: qty 3

## 2015-01-14 MED ORDER — GUAIFENESIN 100 MG/5ML PO LIQD: 200.0000 mg | Freq: Three times a day (TID) | ORAL | Status: DC | PRN

## 2015-01-14 NOTE — Discharge Instructions (Signed)
Chronic Obstructive Pulmonary Disease Follow up with your doctor. You need to have your kidney function rechecked. Return to the ED if you develop new or worsening symptoms. Chronic obstructive pulmonary disease (COPD) is a common lung condition in which airflow from the lungs is limited. COPD is a general term that can be used to describe many different lung problems that limit airflow, including both chronic bronchitis and emphysema. If you have COPD, your lung function will probably never return to normal, but there are measures you can take to improve lung function and make yourself feel better. CAUSES   Smoking (common).  Exposure to secondhand smoke.  Genetic problems.  Chronic inflammatory lung diseases or recurrent infections. SYMPTOMS  Shortness of breath, especially with physical activity.  Deep, persistent (chronic) cough with a large amount of thick mucus.  Wheezing.  Rapid breaths (tachypnea).  Gray or bluish discoloration (cyanosis) of the skin, especially in your fingers, toes, or lips.  Fatigue.  Weight loss.  Frequent infections or episodes when breathing symptoms become much worse (exacerbations).  Chest tightness. DIAGNOSIS Your health care provider will take a medical history and perform a physical examination to diagnose COPD. Additional tests for COPD may include:  Lung (pulmonary) function tests.  Chest X-ray.  CT scan.  Blood tests. TREATMENT  Treatment for COPD may include:  Inhaler and nebulizer medicines. These help manage the symptoms of COPD and make your breathing more comfortable.  Supplemental oxygen. Supplemental oxygen is only helpful if you have a low oxygen level in your blood.  Exercise and physical activity. These are beneficial for nearly all people with COPD.  Lung surgery or transplant.  Nutrition therapy to gain weight, if you are underweight.  Pulmonary rehabilitation. This may involve working with a team of health care  providers and specialists, such as respiratory, occupational, and physical therapists. HOME CARE INSTRUCTIONS  Take all medicines (inhaled or pills) as directed by your health care provider.  Avoid over-the-counter medicines or cough syrups that dry up your airway (such as antihistamines) and slow down the elimination of secretions unless instructed otherwise by your health care provider.  If you are a smoker, the most important thing that you can do is stop smoking. Continuing to smoke will cause further lung damage and breathing trouble. Ask your health care provider for help with quitting smoking. He or she can direct you to community resources or hospitals that provide support.  Avoid exposure to irritants such as smoke, chemicals, and fumes that aggravate your breathing.  Use oxygen therapy and pulmonary rehabilitation if directed by your health care provider. If you require home oxygen therapy, ask your health care provider whether you should purchase a pulse oximeter to measure your oxygen level at home.  Avoid contact with individuals who have a contagious illness.  Avoid extreme temperature and humidity changes.  Eat healthy foods. Eating smaller, more frequent meals and resting before meals may help you maintain your strength.  Stay active, but balance activity with periods of rest. Exercise and physical activity will help you maintain your ability to do things you want to do.  Preventing infection and hospitalization is very important when you have COPD. Make sure to receive all the vaccines your health care provider recommends, especially the pneumococcal and influenza vaccines. Ask your health care provider whether you need a pneumonia vaccine.  Learn and use relaxation techniques to manage stress.  Learn and use controlled breathing techniques as directed by your health care provider. Controlled breathing techniques  include:  Pursed lip breathing. Start by breathing in  (inhaling) through your nose for 1 second. Then, purse your lips as if you were going to whistle and breathe out (exhale) through the pursed lips for 2 seconds.  Diaphragmatic breathing. Start by putting one hand on your abdomen just above your waist. Inhale slowly through your nose. The hand on your abdomen should move out. Then purse your lips and exhale slowly. You should be able to feel the hand on your abdomen moving in as you exhale.  Learn and use controlled coughing to clear mucus from your lungs. Controlled coughing is a series of short, progressive coughs. The steps of controlled coughing are: 1. Lean your head slightly forward. 2. Breathe in deeply using diaphragmatic breathing. 3. Try to hold your breath for 3 seconds. 4. Keep your mouth slightly open while coughing twice. 5. Spit any mucus out into a tissue. 6. Rest and repeat the steps once or twice as needed. SEEK MEDICAL CARE IF:  You are coughing up more mucus than usual.  There is a change in the color or thickness of your mucus.  Your breathing is more labored than usual.  Your breathing is faster than usual. SEEK IMMEDIATE MEDICAL CARE IF:  You have shortness of breath while you are resting.  You have shortness of breath that prevents you from:  Being able to talk.  Performing your usual physical activities.  You have chest pain lasting longer than 5 minutes.  Your skin color is more cyanotic than usual.  You measure low oxygen saturations for longer than 5 minutes with a pulse oximeter. MAKE SURE YOU:  Understand these instructions.  Will watch your condition.  Will get help right away if you are not doing well or get worse.   This information is not intended to replace advice given to you by your health care provider. Make sure you discuss any questions you have with your health care provider.   Document Released: 10/07/2004 Document Revised: 01/18/2014 Document Reviewed: 08/24/2012 Elsevier  Interactive Patient Education [employer].
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
## 2015-01-14 NOTE — ED Notes (Signed)
Non-productive Cough for last month.  Given Z-Pak with no relief.  Seen here in ED about 3 times.

## 2015-01-14 NOTE — ED Notes (Signed)
Patient ambulated in hallway.  Patient's o2 sat maintained at 96% - 99%.  Patient stated she "did not feel as shortwinded as she did at home".

## 2015-01-23 DIAGNOSIS — N183 Chronic kidney disease, stage 3 (moderate): Secondary | ICD-10-CM | POA: Diagnosis not present

## 2015-01-23 DIAGNOSIS — Z6830 Body mass index (BMI) 30.0-30.9, adult: Secondary | ICD-10-CM | POA: Diagnosis not present

## 2015-01-23 DIAGNOSIS — Z1389 Encounter for screening for other disorder: Secondary | ICD-10-CM | POA: Diagnosis not present

## 2015-01-23 DIAGNOSIS — E6609 Other obesity due to excess calories: Secondary | ICD-10-CM | POA: Diagnosis not present

## 2015-01-24 ENCOUNTER — Encounter: Payer: Self-pay | Admitting: Internal Medicine

## 2015-01-24 ENCOUNTER — Ambulatory Visit (INDEPENDENT_AMBULATORY_CARE_PROVIDER_SITE_OTHER): Payer: Medicare Other | Admitting: Internal Medicine

## 2015-01-24 VITALS — BP 140/78 | HR 70 | Ht 63.0 in | Wt 164.4 lb

## 2015-01-24 DIAGNOSIS — I10 Essential (primary) hypertension: Secondary | ICD-10-CM | POA: Diagnosis not present

## 2015-01-24 DIAGNOSIS — I495 Sick sinus syndrome: Secondary | ICD-10-CM

## 2015-01-24 DIAGNOSIS — Z950 Presence of cardiac pacemaker: Secondary | ICD-10-CM | POA: Diagnosis not present

## 2015-01-24 NOTE — Assessment & Plan Note (Signed)
Her blood pressure is minimally elevated. Will follow.

## 2015-01-24 NOTE — Patient Instructions (Signed)
Medication Instructions:  Your physician recommends that you continue on your current medications as directed. Please refer to the Current Medication list given to you today.   Labwork: none  Testing/Procedures: none  Follow-Up: Remote monitoring is used to monitor your Pacemaker or ICD from home. This monitoring reduces the number of office visits required to check your device to one time per year. It allows [place] to keep an eye on the functioning of your device to ensure it is working properly. You are scheduled for a device check from home on 04/28/2015. You may send your transmission at any time that day. If you have a wireless device, the transmission will be sent automatically. After your physician reviews your transmission, you will receive a postcard with your next transmission date.  Your physician wants you to follow-up in: 12 months with Dr. Lovena Le. You will receive a reminder letter in the mail two months in advance. If you don't receive a letter, please call our office to schedule the follow-up appointment.   Any Other Special Instructions Will Be Listed Below (If Applicable).     If you need a refill on your cardiac medications before your next appointment, please call your pharmacy.

## 2015-01-24 NOTE — Assessment & Plan Note (Signed)
She is encouraged to maintain a fat diet and lose weight.
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
## 2015-01-24 NOTE — Assessment & Plan Note (Signed)
Her St. Jude device is working normally. Will recheck in several months. 

## 2015-02-08 LAB — CUP PACEART INCLINIC DEVICE CHECK
Brady Statistic RA Percent Paced: 49 %
Brady Statistic RV Percent Paced: 1 %
Implantable Lead Implant Date: 20161013
Implantable Lead Location: 753859
Implantable Lead Location: 753860
Lead Channel Pacing Threshold Amplitude: 0.5 V
Lead Channel Pacing Threshold Pulse Width: 0.5 ms
Lead Channel Setting Pacing Amplitude: 3.5 V
Lead Channel Setting Pacing Pulse Width: 0.5 ms
Lead Channel Setting Sensing Sensitivity: 2 mV
MDC IDC LEAD IMPLANT DT: 20161013
MDC IDC MSMT LEADCHNL RA PACING THRESHOLD AMPLITUDE: 0.5 V
MDC IDC MSMT LEADCHNL RA PACING THRESHOLD PULSEWIDTH: 0.5 ms
MDC IDC MSMT LEADCHNL RA SENSING INTR AMPL: 1.3 mV
MDC IDC MSMT LEADCHNL RV SENSING INTR AMPL: 12 mV
MDC IDC PG SERIAL: 7820884
MDC IDC SESS DTM: 20170128110230
MDC IDC SET LEADCHNL RV PACING AMPLITUDE: 3.5 V

## 2015-02-10 DIAGNOSIS — N183 Chronic kidney disease, stage 3 (moderate): Secondary | ICD-10-CM | POA: Diagnosis not present

## 2015-02-10 DIAGNOSIS — I10 Essential (primary) hypertension: Secondary | ICD-10-CM | POA: Diagnosis not present

## 2015-04-28 ENCOUNTER — Ambulatory Visit (INDEPENDENT_AMBULATORY_CARE_PROVIDER_SITE_OTHER): Payer: Medicare Other | Admitting: *Deleted

## 2015-04-28 DIAGNOSIS — Z950 Presence of cardiac pacemaker: Secondary | ICD-10-CM | POA: Diagnosis not present

## 2015-04-28 DIAGNOSIS — I495 Sick sinus syndrome: Secondary | ICD-10-CM | POA: Diagnosis not present

## 2015-04-28 NOTE — Progress Notes (Signed)
Remote pacemaker transmission.   

## 2015-05-01 DIAGNOSIS — K5732 Diverticulitis of large intestine without perforation or abscess without bleeding: Secondary | ICD-10-CM | POA: Diagnosis not present

## 2015-05-01 DIAGNOSIS — Z6830 Body mass index (BMI) 30.0-30.9, adult: Secondary | ICD-10-CM | POA: Diagnosis not present

## 2015-05-01 DIAGNOSIS — Z1389 Encounter for screening for other disorder: Secondary | ICD-10-CM | POA: Diagnosis not present

## 2015-05-09 DIAGNOSIS — Z0000 Encounter for general adult medical examination without abnormal findings: Secondary | ICD-10-CM | POA: Diagnosis not present

## 2015-05-09 DIAGNOSIS — E6609 Other obesity due to excess calories: Secondary | ICD-10-CM | POA: Diagnosis not present

## 2015-05-09 DIAGNOSIS — Z6830 Body mass index (BMI) 30.0-30.9, adult: Secondary | ICD-10-CM | POA: Diagnosis not present

## 2015-05-09 DIAGNOSIS — Z1389 Encounter for screening for other disorder: Secondary | ICD-10-CM | POA: Diagnosis not present

## 2015-06-05 ENCOUNTER — Encounter: Payer: Self-pay | Admitting: Cardiology

## 2015-06-05 LAB — CUP PACEART REMOTE DEVICE CHECK
Battery Remaining Longevity: 115 mo
Battery Voltage: 3.04 V
Brady Statistic RA Percent Paced: 51 %
Date Time Interrogation Session: 20170417060013
Implantable Lead Implant Date: 20161013
Implantable Lead Location: 753859
Lead Channel Setting Pacing Amplitude: 2 V
Lead Channel Setting Pacing Amplitude: 2.5 V
MDC IDC LEAD IMPLANT DT: 20161013
MDC IDC LEAD LOCATION: 753860
MDC IDC MSMT BATTERY REMAINING PERCENTAGE: 95.5 %
MDC IDC MSMT LEADCHNL RA IMPEDANCE VALUE: 390 Ohm
MDC IDC MSMT LEADCHNL RA SENSING INTR AMPL: 1.8 mV
MDC IDC MSMT LEADCHNL RV IMPEDANCE VALUE: 560 Ohm
MDC IDC MSMT LEADCHNL RV SENSING INTR AMPL: 12 mV
MDC IDC SET LEADCHNL RV PACING PULSEWIDTH: 0.5 ms
MDC IDC SET LEADCHNL RV SENSING SENSITIVITY: 2 mV
MDC IDC STAT BRADY AP VP PERCENT: 1 %
MDC IDC STAT BRADY AP VS PERCENT: 51 %
MDC IDC STAT BRADY AS VP PERCENT: 1 %
MDC IDC STAT BRADY AS VS PERCENT: 49 %
MDC IDC STAT BRADY RV PERCENT PACED: 1 %
Pulse Gen Model: 2240
Pulse Gen Serial Number: 7820884

## 2015-06-18 DIAGNOSIS — Z961 Presence of intraocular lens: Secondary | ICD-10-CM | POA: Diagnosis not present

## 2015-06-18 DIAGNOSIS — H2511 Age-related nuclear cataract, right eye: Secondary | ICD-10-CM | POA: Diagnosis not present

## 2015-06-18 DIAGNOSIS — Z9842 Cataract extraction status, left eye: Secondary | ICD-10-CM | POA: Diagnosis not present

## 2015-06-18 DIAGNOSIS — D3131 Benign neoplasm of right choroid: Secondary | ICD-10-CM | POA: Diagnosis not present

## 2015-06-23 ENCOUNTER — Ambulatory Visit (INDEPENDENT_AMBULATORY_CARE_PROVIDER_SITE_OTHER): Payer: Medicare Other | Admitting: Otolaryngology

## 2015-06-23 DIAGNOSIS — H6122 Impacted cerumen, left ear: Secondary | ICD-10-CM

## 2015-06-23 DIAGNOSIS — D2222 Melanocytic nevi of left ear and external auricular canal: Secondary | ICD-10-CM | POA: Diagnosis not present

## 2015-07-25 DIAGNOSIS — Z6830 Body mass index (BMI) 30.0-30.9, adult: Secondary | ICD-10-CM | POA: Diagnosis not present

## 2015-07-25 DIAGNOSIS — M1991 Primary osteoarthritis, unspecified site: Secondary | ICD-10-CM | POA: Diagnosis not present

## 2015-07-25 DIAGNOSIS — M7122 Synovial cyst of popliteal space [Baker], left knee: Secondary | ICD-10-CM | POA: Diagnosis not present

## 2015-07-28 ENCOUNTER — Ambulatory Visit (INDEPENDENT_AMBULATORY_CARE_PROVIDER_SITE_OTHER): Payer: Medicare Other | Admitting: *Deleted

## 2015-07-28 DIAGNOSIS — I495 Sick sinus syndrome: Secondary | ICD-10-CM

## 2015-07-28 NOTE — Progress Notes (Signed)
Remote pacemaker transmission.   

## 2015-07-30 ENCOUNTER — Encounter: Payer: Self-pay | Admitting: Cardiology

## 2015-07-30 LAB — CUP PACEART REMOTE DEVICE CHECK
Battery Remaining Longevity: 112 mo
Battery Remaining Percentage: 95.5 %
Battery Voltage: 3.02 V
Brady Statistic AS VP Percent: 1 %
Brady Statistic AS VS Percent: 52 %
Implantable Lead Implant Date: 20161013
Implantable Lead Location: 753859
Implantable Lead Location: 753860
Lead Channel Impedance Value: 400 Ohm
Lead Channel Pacing Threshold Amplitude: 0.5 V
Lead Channel Pacing Threshold Pulse Width: 0.5 ms
Lead Channel Sensing Intrinsic Amplitude: 1.8 mV
Lead Channel Sensing Intrinsic Amplitude: 12 mV
Lead Channel Setting Pacing Amplitude: 2.5 V
MDC IDC LEAD IMPLANT DT: 20161013
MDC IDC MSMT LEADCHNL RA PACING THRESHOLD PULSEWIDTH: 0.5 ms
MDC IDC MSMT LEADCHNL RV IMPEDANCE VALUE: 490 Ohm
MDC IDC MSMT LEADCHNL RV PACING THRESHOLD AMPLITUDE: 0.5 V
MDC IDC PG SERIAL: 7820884
MDC IDC SESS DTM: 20170717060014
MDC IDC SET LEADCHNL RA PACING AMPLITUDE: 2 V
MDC IDC SET LEADCHNL RV PACING PULSEWIDTH: 0.5 ms
MDC IDC SET LEADCHNL RV SENSING SENSITIVITY: 2 mV
MDC IDC STAT BRADY AP VP PERCENT: 1 %
MDC IDC STAT BRADY AP VS PERCENT: 48 %
MDC IDC STAT BRADY RA PERCENT PACED: 48 %
MDC IDC STAT BRADY RV PERCENT PACED: 1 %

## 2015-08-11 DIAGNOSIS — H25091 Other age-related incipient cataract, right eye: Secondary | ICD-10-CM | POA: Diagnosis not present

## 2015-08-11 DIAGNOSIS — H26492 Other secondary cataract, left eye: Secondary | ICD-10-CM | POA: Diagnosis not present

## 2015-09-02 DIAGNOSIS — L821 Other seborrheic keratosis: Secondary | ICD-10-CM | POA: Diagnosis not present

## 2015-09-02 DIAGNOSIS — D485 Neoplasm of uncertain behavior of skin: Secondary | ICD-10-CM | POA: Diagnosis not present

## 2015-09-02 DIAGNOSIS — L820 Inflamed seborrheic keratosis: Secondary | ICD-10-CM | POA: Diagnosis not present

## 2015-10-10 DIAGNOSIS — Z23 Encounter for immunization: Secondary | ICD-10-CM | POA: Diagnosis not present

## 2015-10-14 DIAGNOSIS — L821 Other seborrheic keratosis: Secondary | ICD-10-CM | POA: Diagnosis not present

## 2015-10-14 DIAGNOSIS — D485 Neoplasm of uncertain behavior of skin: Secondary | ICD-10-CM | POA: Diagnosis not present

## 2015-10-14 DIAGNOSIS — L820 Inflamed seborrheic keratosis: Secondary | ICD-10-CM | POA: Diagnosis not present

## 2015-10-27 ENCOUNTER — Encounter: Payer: Medicare Other | Admitting: *Deleted

## 2015-10-31 ENCOUNTER — Encounter: Payer: Self-pay | Admitting: Cardiology

## 2015-11-04 ENCOUNTER — Ambulatory Visit (INDEPENDENT_AMBULATORY_CARE_PROVIDER_SITE_OTHER): Payer: Medicare Other | Admitting: *Deleted

## 2015-11-04 DIAGNOSIS — I495 Sick sinus syndrome: Secondary | ICD-10-CM | POA: Diagnosis not present

## 2015-11-04 NOTE — Progress Notes (Signed)
Remote pacemaker transmission.   

## 2015-11-05 ENCOUNTER — Encounter: Payer: Self-pay | Admitting: Cardiology

## 2015-11-12 DIAGNOSIS — Z6830 Body mass index (BMI) 30.0-30.9, adult: Secondary | ICD-10-CM | POA: Diagnosis not present

## 2015-11-12 DIAGNOSIS — G8929 Other chronic pain: Secondary | ICD-10-CM | POA: Diagnosis not present

## 2015-11-12 DIAGNOSIS — M546 Pain in thoracic spine: Secondary | ICD-10-CM | POA: Diagnosis not present

## 2015-11-12 DIAGNOSIS — M1991 Primary osteoarthritis, unspecified site: Secondary | ICD-10-CM | POA: Diagnosis not present

## 2015-12-04 LAB — CUP PACEART REMOTE DEVICE CHECK
Battery Voltage: 3.02 V
Brady Statistic AP VP Percent: 1 %
Brady Statistic RA Percent Paced: 51 %
Brady Statistic RV Percent Paced: 1 %
Date Time Interrogation Session: 20171024120204
Implantable Lead Implant Date: 20161013
Implantable Lead Location: 753859
Implantable Pulse Generator Implant Date: 20161013
Lead Channel Impedance Value: 630 Ohm
Lead Channel Pacing Threshold Amplitude: 0.5 V
Lead Channel Pacing Threshold Pulse Width: 0.5 ms
Lead Channel Sensing Intrinsic Amplitude: 12 mV
Lead Channel Setting Pacing Amplitude: 2.5 V
Lead Channel Setting Pacing Pulse Width: 0.5 ms
MDC IDC LEAD IMPLANT DT: 20161013
MDC IDC LEAD LOCATION: 753860
MDC IDC MSMT BATTERY REMAINING LONGEVITY: 112 mo
MDC IDC MSMT BATTERY REMAINING PERCENTAGE: 95.5 %
MDC IDC MSMT LEADCHNL RA IMPEDANCE VALUE: 410 Ohm
MDC IDC MSMT LEADCHNL RA SENSING INTR AMPL: 1.9 mV
MDC IDC MSMT LEADCHNL RV PACING THRESHOLD AMPLITUDE: 0.5 V
MDC IDC MSMT LEADCHNL RV PACING THRESHOLD PULSEWIDTH: 0.5 ms
MDC IDC SET LEADCHNL RA PACING AMPLITUDE: 2 V
MDC IDC SET LEADCHNL RV SENSING SENSITIVITY: 2 mV
MDC IDC STAT BRADY AP VS PERCENT: 51 %
MDC IDC STAT BRADY AS VP PERCENT: 1 %
MDC IDC STAT BRADY AS VS PERCENT: 48 %
Pulse Gen Model: 2240
Pulse Gen Serial Number: 7820884

## 2016-01-24 IMAGING — DX DG CHEST 2V
2 series · 2 of 2 positions shown · non-contrast
Comparison: 10/25/2014.

CLINICAL DATA: Patient states she went to [HOSPITAL] two days ago
and had a chest x-ray and was told she had pneumonia. States "I
called them today about an inhaler and I am still short of breath
and they told me to come to the hospital for admission."

EXAM:
CHEST  2 VIEW

[chest pa]
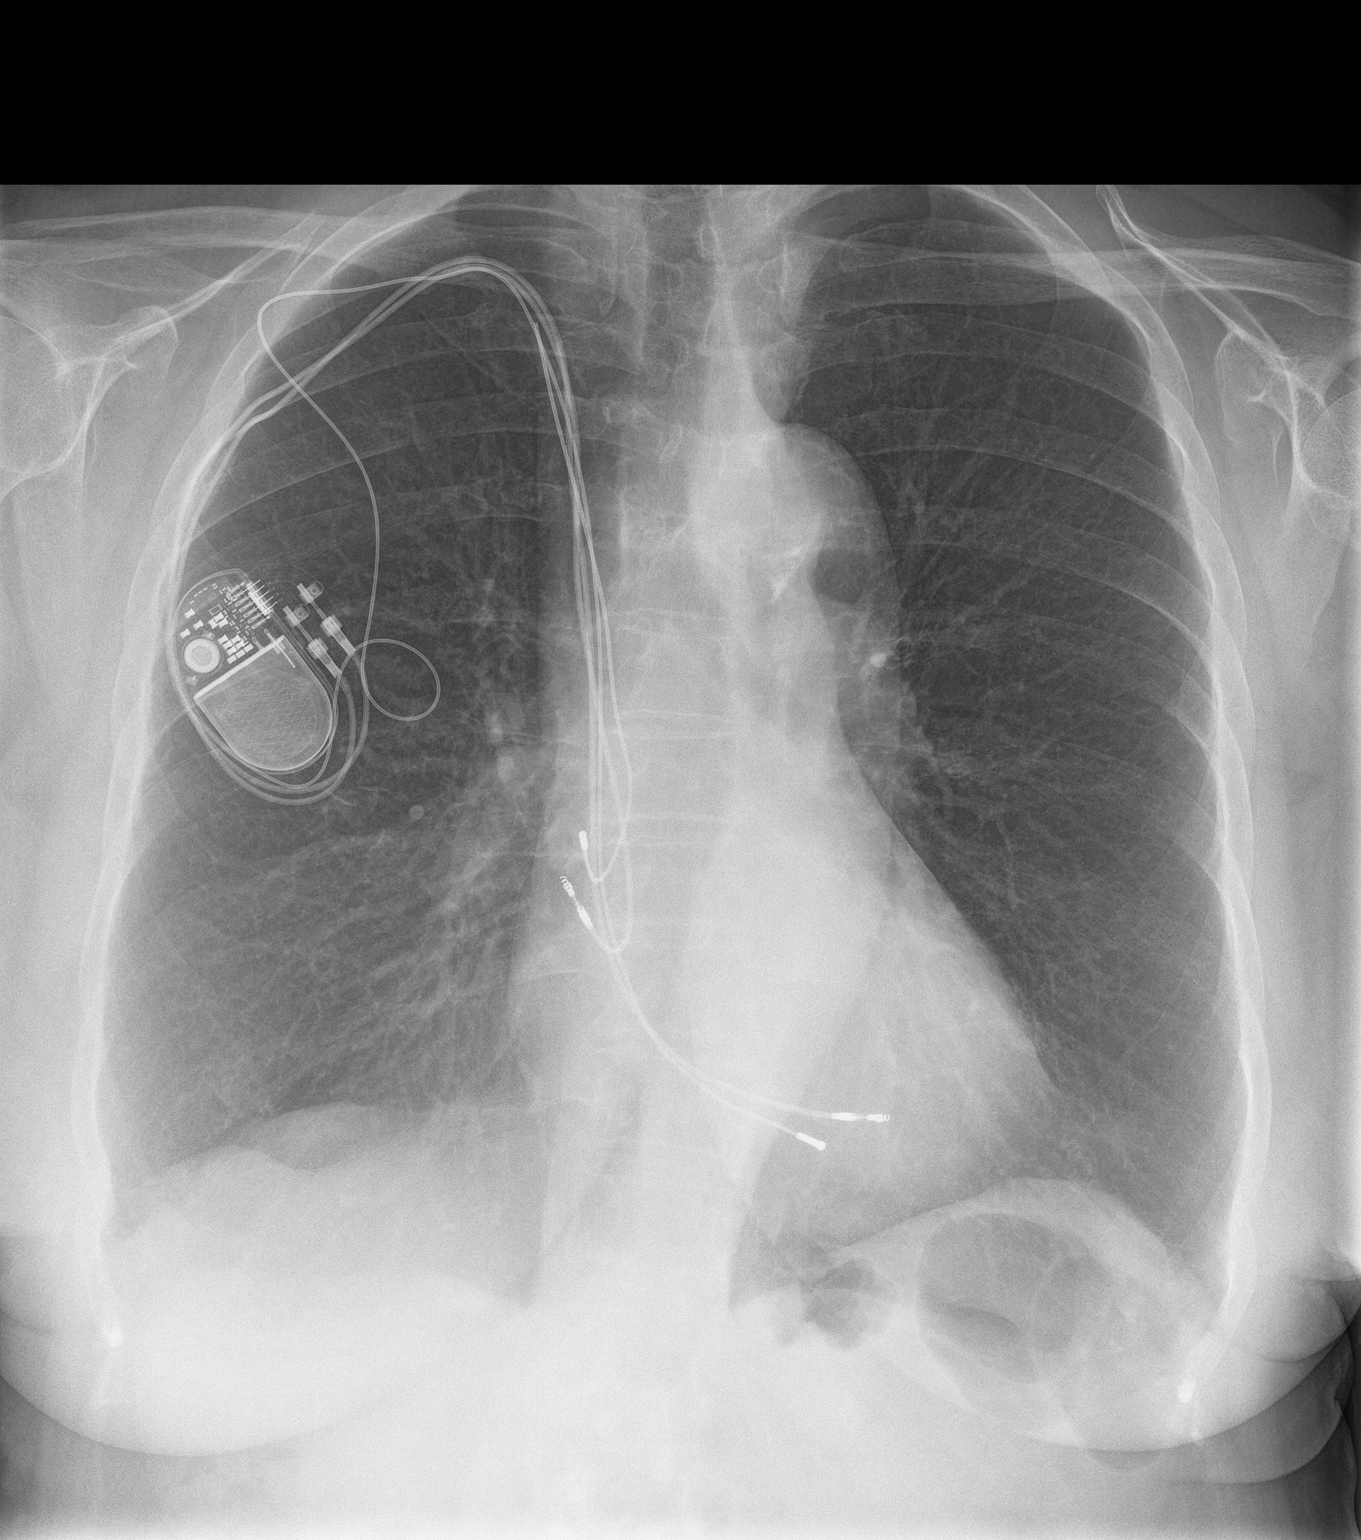

[chest lat]
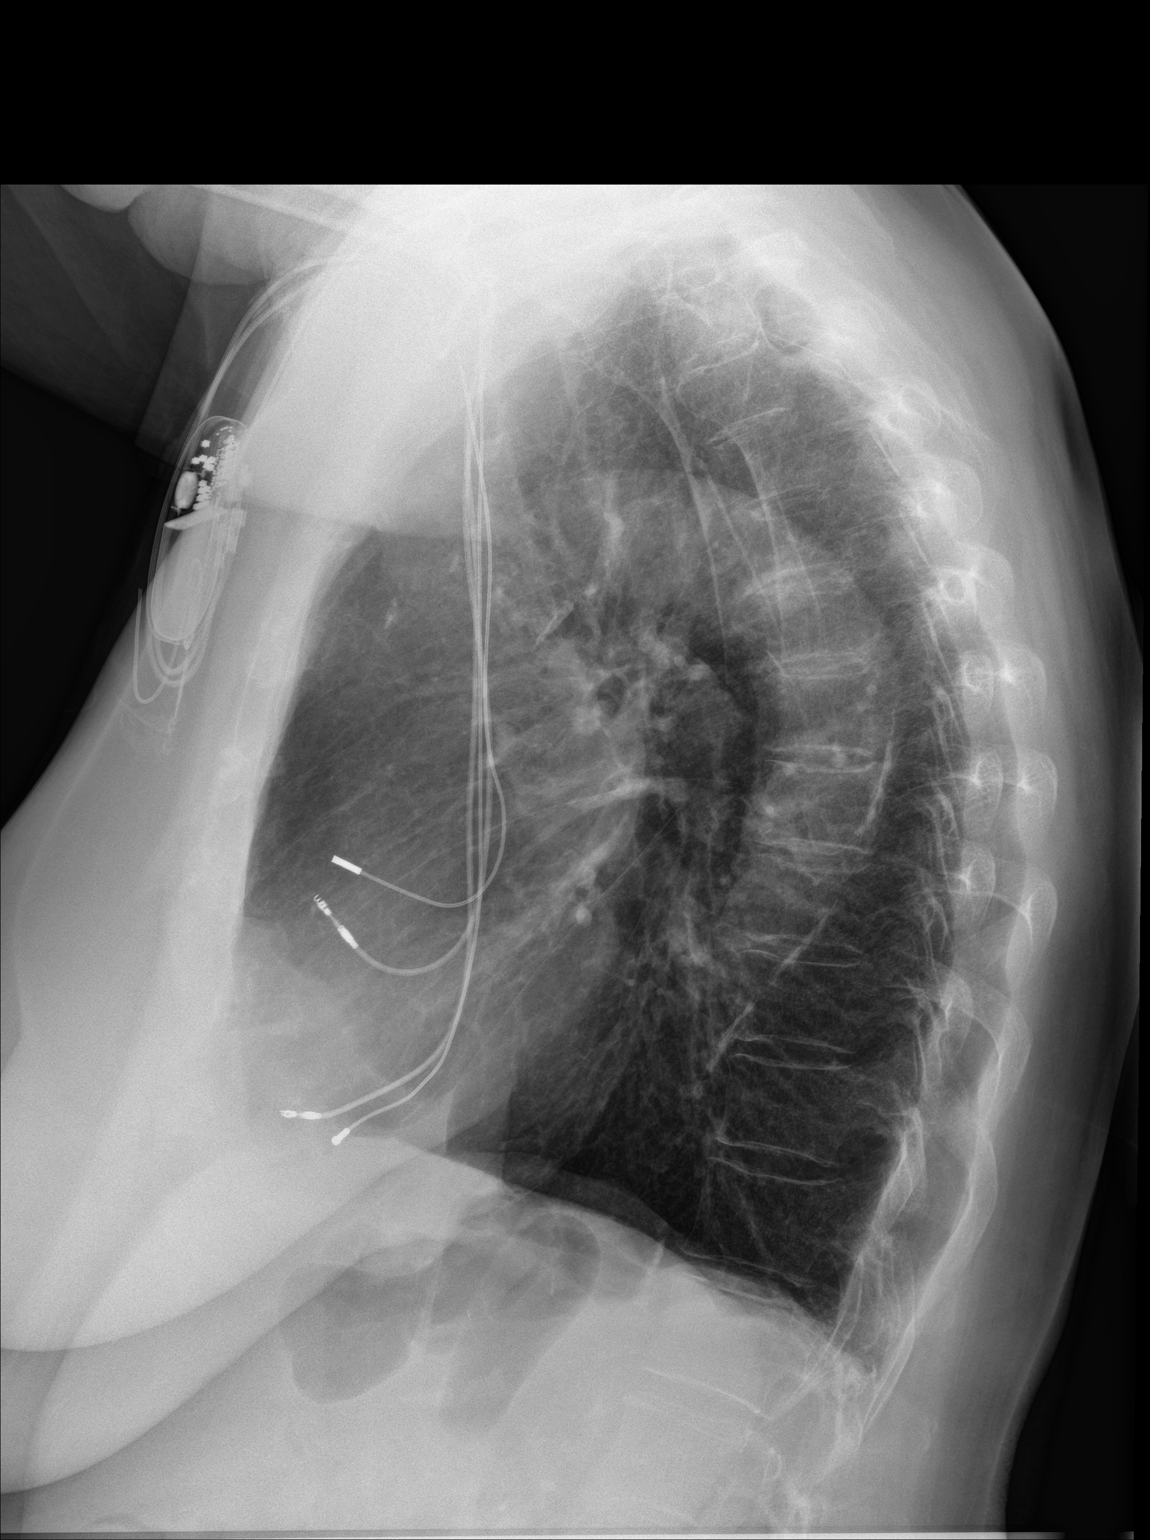

[2 of 2 positions shown; findings below may reference images not displayed]

FINDINGS: Multilead pacer unchanged. Normal cardiac and mediastinal
silhouette. Thoracic ectasia. Clear lung fields with hyperinflation.
Osteopenia. Similar appearance to priors.
IMPRESSION: COPD.  No active disease.

## 2016-01-27 ENCOUNTER — Ambulatory Visit (INDEPENDENT_AMBULATORY_CARE_PROVIDER_SITE_OTHER): Payer: Medicare Other | Admitting: Internal Medicine

## 2016-01-27 ENCOUNTER — Encounter: Payer: Self-pay | Admitting: Internal Medicine

## 2016-01-27 VITALS — BP 122/80 | HR 64 | Ht 63.0 in | Wt 166.6 lb

## 2016-01-27 DIAGNOSIS — I495 Sick sinus syndrome: Secondary | ICD-10-CM | POA: Diagnosis not present

## 2016-01-27 DIAGNOSIS — Z45018 Encounter for adjustment and management of other part of cardiac pacemaker: Secondary | ICD-10-CM
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
## 2016-01-27 NOTE — Patient Instructions (Addendum)
Medication Instructions:    Your physician recommends that you continue on your current medications as directed. Please refer to the Current Medication list given to you today.  --- If you need a refill on your cardiac medications before your next appointment, please call your pharmacy. ---  Labwork:  None ordered  Testing/Procedures:  None ordered  Follow-Up: Remote monitoring is used to monitor your Pacemaker of ICD from home. This monitoring reduces the number of office visits required to check your device to one time per year. It allows [place] to keep an eye on the functioning of your device to ensure it is working properly. You are scheduled for a device check from home on 04/27/2016. You may send your transmission at any time that day. If you have a wireless device, the transmission will be sent automatically. After your physician reviews your transmission, you will receive a postcard with your next transmission date.   Your physician wants you to follow-up in: 1 year with Dr. Lovena Le in [place].  You will receive a reminder letter in the mail two months in advance. If you don't receive a letter, please call our office to schedule the follow-up appointment.   Any Other Special Instructions Will Be Listed Below (If Applicable).     Thank you for choosing [facility]!!

## 2016-02-06 DIAGNOSIS — I10 Essential (primary) hypertension: Secondary | ICD-10-CM | POA: Diagnosis not present

## 2016-02-06 DIAGNOSIS — E6609 Other obesity due to excess calories: Secondary | ICD-10-CM | POA: Diagnosis not present

## 2016-02-06 DIAGNOSIS — Z1389 Encounter for screening for other disorder: Secondary | ICD-10-CM | POA: Diagnosis not present

## 2016-02-06 DIAGNOSIS — E782 Mixed hyperlipidemia: Secondary | ICD-10-CM | POA: Diagnosis not present

## 2016-02-06 DIAGNOSIS — Z6831 Body mass index (BMI) 31.0-31.9, adult: Secondary | ICD-10-CM | POA: Diagnosis not present

## 2016-02-11 DIAGNOSIS — D351 Benign neoplasm of parathyroid gland: Secondary | ICD-10-CM | POA: Diagnosis not present

## 2016-02-11 DIAGNOSIS — N183 Chronic kidney disease, stage 3 (moderate): Secondary | ICD-10-CM | POA: Diagnosis not present

## 2016-02-11 DIAGNOSIS — I10 Essential (primary) hypertension: Secondary | ICD-10-CM | POA: Diagnosis not present

## 2016-02-11 DIAGNOSIS — Z720 Tobacco use: Secondary | ICD-10-CM | POA: Diagnosis not present

## 2016-02-11 DIAGNOSIS — N390 Urinary tract infection, site not specified: Secondary | ICD-10-CM | POA: Diagnosis not present

## 2016-02-23 LAB — CUP PACEART INCLINIC DEVICE CHECK
Implantable Lead Implant Date: 20161013
Implantable Lead Implant Date: 20161013
Implantable Lead Location: 753859
Implantable Pulse Generator Implant Date: 20161013
Lead Channel Setting Pacing Amplitude: 2 V
Lead Channel Setting Pacing Amplitude: 2.5 V
Lead Channel Setting Pacing Pulse Width: 0.5 ms
Lead Channel Setting Sensing Sensitivity: 2 mV
MDC IDC LEAD LOCATION: 753860
MDC IDC MSMT BATTERY VOLTAGE: 3.01 V
MDC IDC MSMT LEADCHNL RA IMPEDANCE VALUE: 410 Ohm
MDC IDC MSMT LEADCHNL RV IMPEDANCE VALUE: 510 Ohm
MDC IDC SESS DTM: 20180212142218
MDC IDC STAT BRADY RA PERCENT PACED: 52 %
MDC IDC STAT BRADY RV PERCENT PACED: 1 % — AB
Pulse Gen Model: 2240
Pulse Gen Serial Number: 7820884

## 2016-02-26 DIAGNOSIS — N182 Chronic kidney disease, stage 2 (mild): Secondary | ICD-10-CM | POA: Diagnosis not present

## 2016-02-26 DIAGNOSIS — Z6825 Body mass index (BMI) 25.0-25.9, adult: Secondary | ICD-10-CM | POA: Diagnosis not present

## 2016-02-26 DIAGNOSIS — Z1389 Encounter for screening for other disorder: Secondary | ICD-10-CM | POA: Diagnosis not present

## 2016-02-26 DIAGNOSIS — R319 Hematuria, unspecified: Secondary | ICD-10-CM | POA: Diagnosis not present

## 2016-02-26 DIAGNOSIS — N184 Chronic kidney disease, stage 4 (severe): Secondary | ICD-10-CM | POA: Diagnosis not present

## 2016-02-26 DIAGNOSIS — K5732 Diverticulitis of large intestine without perforation or abscess without bleeding: Secondary | ICD-10-CM | POA: Diagnosis not present

## 2016-03-04 ENCOUNTER — Encounter (HOSPITAL_COMMUNITY): Payer: Self-pay | Admitting: Emergency Medicine

## 2016-03-04 ENCOUNTER — Emergency Department (HOSPITAL_COMMUNITY)
Admission: EM | Admit: 2016-03-04 | Discharge: 2016-03-04 | Disposition: A | Discharge status: Home / Self Care | Payer: Medicare Other | Attending: Emergency Medicine | Admitting: Emergency Medicine

## 2016-03-04 ENCOUNTER — Emergency Department (HOSPITAL_COMMUNITY): Payer: Medicare Other

## 2016-03-04 DIAGNOSIS — N183 Chronic kidney disease, stage 3 (moderate): Secondary | ICD-10-CM | POA: Insufficient documentation

## 2016-03-04 DIAGNOSIS — R110 Nausea: Secondary | ICD-10-CM | POA: Insufficient documentation

## 2016-03-04 DIAGNOSIS — F17210 Nicotine dependence, cigarettes, uncomplicated: Secondary | ICD-10-CM | POA: Diagnosis not present

## 2016-03-04 DIAGNOSIS — I129 Hypertensive chronic kidney disease with stage 1 through stage 4 chronic kidney disease, or unspecified chronic kidney disease: Secondary | ICD-10-CM | POA: Insufficient documentation

## 2016-03-04 DIAGNOSIS — R1032 Left lower quadrant pain: Secondary | ICD-10-CM | POA: Diagnosis not present

## 2016-03-04 DIAGNOSIS — Z950 Presence of cardiac pacemaker: Secondary | ICD-10-CM | POA: Diagnosis not present

## 2016-03-04 DIAGNOSIS — Z79899 Other long term (current) drug therapy: Secondary | ICD-10-CM | POA: Diagnosis not present

## 2016-03-04 DIAGNOSIS — N281 Cyst of kidney, acquired: Secondary | ICD-10-CM | POA: Diagnosis not present

## 2016-03-04 DIAGNOSIS — R1030 Lower abdominal pain, unspecified: Secondary | ICD-10-CM

## 2016-03-04 LAB — COMPREHENSIVE METABOLIC PANEL
ALBUMIN: 4.2 g/dL (ref 3.5–5.0)
ALT: 13 U/L — AB (ref 14–54)
AST: 21 U/L (ref 15–41)
Alkaline Phosphatase: 65 U/L (ref 38–126)
Anion gap: 8 (ref 5–15)
BUN: 23 mg/dL — AB (ref 6–20)
CHLORIDE: 110 mmol/L (ref 101–111)
CO2: 23 mmol/L (ref 22–32)
CREATININE: 2.31 mg/dL — AB (ref 0.44–1.00)
Calcium: 9.3 mg/dL (ref 8.9–10.3)
GFR calc Af Amer: 21 mL/min — ABNORMAL LOW (ref >60)
GFR calc non Af Amer: 19 mL/min — ABNORMAL LOW (ref >60)
Glucose, Bld: 105 mg/dL — ABNORMAL HIGH (ref 65–99)
Potassium: 3.9 mmol/L (ref 3.5–5.1)
SODIUM: 141 mmol/L (ref 135–145)
Total Bilirubin: 0.7 mg/dL (ref 0.3–1.2)
Total Protein: 6.9 g/dL (ref 6.5–8.1)

## 2016-03-04 LAB — CBC
HCT: 42 % (ref 36.0–46.0)
Hemoglobin: 14.3 g/dL (ref 12.0–15.0)
MCH: 30.6 pg (ref 26.0–34.0)
MCHC: 34 g/dL (ref 30.0–36.0)
MCV: 89.7 fL (ref 78.0–100.0)
PLATELETS: 140 10*3/uL — AB (ref 150–400)
RBC: 4.68 MIL/uL (ref 3.87–5.11)
RDW: 13.5 % (ref 11.5–15.5)
WBC: 7.3 10*3/uL (ref 4.0–10.5)

## 2016-03-04 LAB — URINALYSIS, ROUTINE W REFLEX MICROSCOPIC
Bacteria, UA: NONE SEEN
Bilirubin Urine: NEGATIVE
Glucose, UA: NEGATIVE mg/dL
KETONES UR: NEGATIVE mg/dL
Nitrite: NEGATIVE
PH: 5 (ref 5.0–8.0)
PROTEIN: 30 mg/dL — AB
Specific Gravity, Urine: 1.016 (ref 1.005–1.030)

## 2016-03-04 LAB — LIPASE, BLOOD: LIPASE: 20 U/L (ref 11–51)

## 2016-03-04 MED ORDER — ONDANSETRON HCL 4 MG/2ML IJ SOLN
4.0000 mg | Freq: Once | INTRAMUSCULAR | Status: AC
  Administered 2016-03-04: 4 mg via INTRAVENOUS
  Filled 2016-03-04: qty 2

## 2016-03-04 MED ORDER — CIPROFLOXACIN HCL 500 MG PO TABS: 500.0000 mg | ORAL_TABLET | Freq: Two times a day (BID) | ORAL | 0 refills | Status: DC

## 2016-03-04 MED ORDER — IOPAMIDOL (ISOVUE-300) INJECTION 61%: 15.0000 mL | INTRAVENOUS | Status: AC

## 2016-03-04 MED ORDER — TRAMADOL HCL 50 MG PO TABS: 50.0000 mg | ORAL_TABLET | Freq: Four times a day (QID) | ORAL | 0 refills | Status: DC | PRN

## 2016-03-04 MED ORDER — METRONIDAZOLE 500 MG PO TABS
500.0000 mg | ORAL_TABLET | Freq: Once | ORAL | Status: AC
  Administered 2016-03-04: 500 mg via ORAL
  Filled 2016-03-04: qty 1

## 2016-03-04 MED ORDER — ONDANSETRON 4 MG PO TBDP: ORAL_TABLET | ORAL | 0 refills | Status: DC

## 2016-03-04 MED ORDER — HYDROMORPHONE HCL 1 MG/ML IJ SOLN
0.5000 mg | Freq: Once | INTRAMUSCULAR | Status: AC
  Administered 2016-03-04: 0.5 mg via INTRAVENOUS
  Filled 2016-03-04: qty 1

## 2016-03-04 MED ORDER — CIPROFLOXACIN HCL 250 MG PO TABS
500.0000 mg | ORAL_TABLET | Freq: Once | ORAL | Status: AC
  Administered 2016-03-04: 500 mg via ORAL
  Filled 2016-03-04: qty 2

## 2016-03-04 MED ORDER — SODIUM CHLORIDE 0.9 % IV BOLUS (SEPSIS): 500.0000 mL | Freq: Once | INTRAVENOUS | Status: DC

## 2016-03-04 MED ORDER — METRONIDAZOLE 500 MG PO TABS: 500.0000 mg | ORAL_TABLET | Freq: Four times a day (QID) | ORAL | 0 refills | Status: DC

## 2016-03-04 NOTE — Discharge Instructions (Signed)
Follow-up with the stomach Dr. or gastroenterologist as planned. Return sooner if problems

## 2016-03-04 NOTE — ED Notes (Signed)
Pt states understanding of care given and follow up instructions.  Pt a/o, ambulated from ED with daughter.

## 2016-03-04 NOTE — ED Triage Notes (Signed)
Pt here for left lower abd pain x1 week, was seen by PCP and given antibiotics.  Pt also having nausea and diarrhea, has hx of diverticulitis and colon surgery 18 years ago. Pt alert and oriented.
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
## 2016-03-10 ENCOUNTER — Encounter: Payer: Self-pay | Admitting: Gastroenterology

## 2016-03-10 DIAGNOSIS — Z6830 Body mass index (BMI) 30.0-30.9, adult: Secondary | ICD-10-CM | POA: Diagnosis not present

## 2016-03-10 DIAGNOSIS — K5792 Diverticulitis of intestine, part unspecified, without perforation or abscess without bleeding: Secondary | ICD-10-CM | POA: Diagnosis not present

## 2016-03-16 ENCOUNTER — Ambulatory Visit: Payer: Medicare Other | Admitting: Gastroenterology

## 2016-03-25 ENCOUNTER — Ambulatory Visit (INDEPENDENT_AMBULATORY_CARE_PROVIDER_SITE_OTHER): Payer: Medicare Other | Admitting: Family Medicine

## 2016-03-25 ENCOUNTER — Encounter: Payer: Self-pay | Admitting: Family Medicine

## 2016-03-25 VITALS — BP 140/80 | HR 84 | Temp 97.9°F | Resp 18 | Ht 62.0 in | Wt 161.1 lb

## 2016-03-25 DIAGNOSIS — F17200 Nicotine dependence, unspecified, uncomplicated: Secondary | ICD-10-CM | POA: Diagnosis not present

## 2016-03-25 DIAGNOSIS — K5720 Diverticulitis of large intestine with perforation and abscess without bleeding: Secondary | ICD-10-CM

## 2016-03-25 DIAGNOSIS — N184 Chronic kidney disease, stage 4 (severe): Secondary | ICD-10-CM

## 2016-03-25 DIAGNOSIS — M810 Age-related osteoporosis without current pathological fracture: Secondary | ICD-10-CM

## 2016-03-25 DIAGNOSIS — N183 Chronic kidney disease, stage 3 unspecified: Secondary | ICD-10-CM

## 2016-03-25 DIAGNOSIS — E538 Deficiency of other specified B group vitamins: Secondary | ICD-10-CM | POA: Diagnosis not present

## 2016-03-25 DIAGNOSIS — I700 Atherosclerosis of aorta: Secondary | ICD-10-CM | POA: Diagnosis not present

## 2016-03-25 DIAGNOSIS — E785 Hyperlipidemia, unspecified: Secondary | ICD-10-CM

## 2016-03-25 DIAGNOSIS — Z7689 Persons encountering health services in other specified circumstances: Secondary | ICD-10-CM

## 2016-03-25 LAB — CBC
HCT: 41.9 % (ref 35.0–45.0)
Hemoglobin: 13.7 g/dL (ref 11.7–15.5)
MCH: 29.7 pg (ref 27.0–33.0)
MCHC: 32.7 g/dL (ref 32.0–36.0)
MCV: 90.9 fL (ref 80.0–100.0)
MPV: 11.1 fL (ref 7.5–12.5)
Platelets: 168 10*3/uL (ref 140–400)
RBC: 4.61 MIL/uL (ref 3.80–5.10)
RDW: 14 % (ref 11.0–15.0)
WBC: 7.6 10*3/uL (ref 3.8–10.8)

## 2016-03-25 NOTE — Patient Instructions (Addendum)
Walk every day that you are able Try to cut down or stop smoking Need follow up and lab tests in 3 months Will do memory testing next time (40 min visit) Need updated bone density test  Need old records Dr Hilma Favors
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
## 2016-03-26 ENCOUNTER — Telehealth: Payer: Self-pay | Admitting: Family Medicine

## 2016-03-26 ENCOUNTER — Encounter: Payer: Self-pay | Admitting: Family Medicine

## 2016-03-26 LAB — COMPREHENSIVE METABOLIC PANEL
ALBUMIN: 3.9 g/dL (ref 3.6–5.1)
ALT: 9 U/L (ref 6–29)
AST: 15 U/L (ref 10–35)
Alkaline Phosphatase: 64 U/L (ref 33–130)
BILIRUBIN TOTAL: 0.6 mg/dL (ref 0.2–1.2)
BUN: 20 mg/dL (ref 7–25)
CHLORIDE: 111 mmol/L — AB (ref 98–110)
CO2: 23 mmol/L (ref 20–31)
CREATININE: 1.76 mg/dL — AB (ref 0.60–0.88)
Calcium: 9.2 mg/dL (ref 8.6–10.4)
Glucose, Bld: 84 mg/dL (ref 65–99)
Potassium: 4.1 mmol/L (ref 3.5–5.3)
SODIUM: 145 mmol/L (ref 135–146)
Total Protein: 6.4 g/dL (ref 6.1–8.1)

## 2016-03-26 LAB — URINALYSIS, ROUTINE W REFLEX MICROSCOPIC
Bilirubin Urine: NEGATIVE
Glucose, UA: NEGATIVE
KETONES UR: NEGATIVE
LEUKOCYTES UA: NEGATIVE
NITRITE: NEGATIVE
PH: 6 (ref 5.0–8.0)
Specific Gravity, Urine: 1.016 (ref 1.001–1.035)

## 2016-03-26 LAB — LIPID PANEL
Cholesterol: 165 mg/dL (ref <200)
HDL: 49 mg/dL — ABNORMAL LOW (ref >50)
LDL CALC: 90 mg/dL (ref <100)
TRIGLYCERIDES: 131 mg/dL (ref <150)
Total CHOL/HDL Ratio: 3.4 Ratio (ref <5.0)
VLDL: 26 mg/dL (ref <30)

## 2016-03-26 LAB — URINALYSIS, MICROSCOPIC ONLY
BACTERIA UA: NONE SEEN [HPF]
Casts: NONE SEEN [LPF]
Crystals: NONE SEEN [HPF]
SQUAMOUS EPITHELIAL / LPF: NONE SEEN [HPF] (ref <=5)
Yeast: NONE SEEN [HPF]

## 2016-03-26 LAB — VITAMIN D 25 HYDROXY (VIT D DEFICIENCY, FRACTURES): Vit D, 25-Hydroxy: 15 ng/mL — ABNORMAL LOW (ref 30–100)

## 2016-03-26 LAB — VITAMIN B12: VITAMIN B 12: 212 pg/mL (ref 200–1100)

## 2016-03-26 NOTE — Telephone Encounter (Signed)
[name] is calling on behalf of her mother Ashley Olsen she states that Dr. Lovena Le Cardiologist in [place] is keeping up with her heart monitor she has a box at home that monitors her steadily, she forgot to tell that yesterday

## 2016-04-21 ENCOUNTER — Encounter: Payer: Self-pay | Admitting: Gastroenterology

## 2016-04-21 ENCOUNTER — Ambulatory Visit (INDEPENDENT_AMBULATORY_CARE_PROVIDER_SITE_OTHER): Payer: Medicare Other | Admitting: Gastroenterology

## 2016-04-21 DIAGNOSIS — K5730 Diverticulosis of large intestine without perforation or abscess without bleeding: Secondary | ICD-10-CM | POA: Diagnosis not present

## 2016-04-21 NOTE — Assessment & Plan Note (Addendum)
TREATED FOR DIVERTICULITIS BUT CT SCAN FEB 2018: NAIAP. DID NOT TOLERATE CIPRO/FLAGYL. SYMPTOMS CONTROLLED/RESOLVED WITH MIRALAX. IT IS NOT CLEAR IF SHE HAD SUBACUTE DIVERTICULITIS OR REFRACTORY CONSTIPATION.   CONTINUE TO MONITOR SYMPTOMS. AUGMENTIN IF HAS DIVERTICULITIS DRINK WATER EAT FIBER CALL WITH QUESTIONS OR CONCERNS. OPV TBS SCHEDULED IF NEEDED.

## 2016-04-21 NOTE — Patient Instructions (Signed)
DRINK WATER TO KEEP YOUR URINE LIGHT YELLOW.  FOLLOW A HIGH FIBER DIET. AVOID ITEMS THAT CAUSE BLOATING & GAS. SEE INFO BELOW.  USE MIRALAX 1 OR 2 SCOOPS DAILY IN 8 OZ OF LIQUID TO PREVENT CONSTIPATION.  PLEASE CALL WITH QUESTIONS OR CONCERNS.  FOLLOW UP IF NEEDED.   High-Fiber Diet A high-fiber diet changes your normal diet to include more whole grains, legumes, fruits, and vegetables. Changes in the diet involve replacing refined carbohydrates with unrefined foods. The calorie level of the diet is essentially unchanged. The Dietary Reference Intake (recommended amount) for adult males is 38 grams per day. For adult females, it is 25 grams per day. Fiber is the intact part of a plant that is not broken down during digestion. Functional fiber is fiber that has been isolated from the plant to provide a beneficial effect in the body.  PURPOSE  Increase stool bulk.   Ease and regulate bowel movements.   Lower cholesterol.   REDUCE RISK OF COLON CANCER  INDICATIONS THAT YOU NEED MORE FIBER  Constipation and hemorrhoids.   Uncomplicated diverticulosis (intestine condition) and irritable bowel syndrome.   Weight management.   As a protective measure against hardening of the arteries (atherosclerosis), diabetes, and cancer.   GUIDELINES FOR INCREASING FIBER IN THE DIET  Start adding fiber to the diet slowly. A gradual increase of about 5 more grams (2 slices of whole-wheat bread, 2 servings of most fruits or vegetables, or 1 bowl of high-fiber cereal) per day is best. Too rapid an increase in fiber may result in constipation, flatulence, and bloating.   Drink enough water and fluids to keep your urine clear or pale yellow. Water, juice, or caffeine-free drinks are recommended. Not drinking enough fluid may cause constipation.   Eat a variety of high-fiber foods rather than one type of fiber.   Try to increase your intake of fiber through using high-fiber foods rather than fiber  pills or supplements that contain small amounts of fiber.   The goal is to change the types of food eaten. Do not supplement your present diet with high-fiber foods, but replace foods in your present diet.  INCLUDE A VARIETY OF FIBER SOURCES  Replace refined and processed grains with whole grains, canned fruits with fresh fruits, and incorporate other fiber sources. White rice, white breads, and most bakery goods contain little or no fiber.   Brown whole-grain rice, buckwheat oats, and many fruits and vegetables are all good sources of fiber. These include: broccoli, Brussels sprouts, cabbage, cauliflower, beets, sweet potatoes, white potatoes (skin on), carrots, tomatoes, eggplant, squash, berries, fresh fruits, and dried fruits.   Cereals appear to be the richest source of fiber. Cereal fiber is found in whole grains and bran. Bran is the fiber-rich outer coat of cereal grain, which is largely removed in refining. In whole-grain cereals, the bran remains. In breakfast cereals, the largest amount of fiber is found in those with "bran" in their names. The fiber content is sometimes indicated on the label.   You may need to include additional fruits and vegetables each day.   In baking, for 1 cup white flour, you may use the following substitutions:   1 cup whole-wheat flour minus 2 tablespoons.   1/2 cup white flour plus 1/2 cup whole-wheat flour.
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
## 2016-04-22 NOTE — Progress Notes (Signed)
cc'd to pcp 

## 2016-04-27 ENCOUNTER — Encounter: Payer: Medicare Other | Admitting: *Deleted

## 2016-04-27 ENCOUNTER — Telehealth: Payer: Self-pay | Admitting: Cardiology

## 2016-04-27 NOTE — Telephone Encounter (Signed)
New Message:    Please call,having problem with her box/

## 2016-04-27 NOTE — Telephone Encounter (Signed)
Confirmed remote transmission w/ pt daughter.   

## 2016-04-27 NOTE — Telephone Encounter (Signed)
Spoke w/ pt and gave her the number to tech support to call to get help trouble shooting her home monitor.

## 2016-04-30 ENCOUNTER — Encounter: Payer: Self-pay | Admitting: Cardiology

## 2016-05-10 DIAGNOSIS — L821 Other seborrheic keratosis: Secondary | ICD-10-CM | POA: Diagnosis not present

## 2016-05-10 DIAGNOSIS — L570 Actinic keratosis: Secondary | ICD-10-CM | POA: Diagnosis not present

## 2016-05-20 ENCOUNTER — Ambulatory Visit (INDEPENDENT_AMBULATORY_CARE_PROVIDER_SITE_OTHER): Payer: Medicare Other | Admitting: Family Medicine

## 2016-05-20 ENCOUNTER — Telehealth: Payer: Self-pay | Admitting: Family Medicine

## 2016-05-20 ENCOUNTER — Encounter: Payer: Self-pay | Admitting: Family Medicine

## 2016-05-20 VITALS — BP 136/78 | HR 68 | Temp 97.9°F | Resp 18 | Ht 63.0 in | Wt 159.1 lb

## 2016-05-20 DIAGNOSIS — I10 Essential (primary) hypertension: Secondary | ICD-10-CM

## 2016-05-20 DIAGNOSIS — F17200 Nicotine dependence, unspecified, uncomplicated: Secondary | ICD-10-CM | POA: Diagnosis not present

## 2016-05-20 DIAGNOSIS — R1032 Left lower quadrant pain: Secondary | ICD-10-CM

## 2016-05-20 DIAGNOSIS — E785 Hyperlipidemia, unspecified: Secondary | ICD-10-CM

## 2016-05-20 MED ORDER — AMLODIPINE BESYLATE 10 MG PO TABS: 5.0000 mg | ORAL_TABLET | Freq: Every day | ORAL | 3 refills | Status: DC

## 2016-05-20 MED ORDER — SIMVASTATIN 20 MG PO TABS: 20.0000 mg | ORAL_TABLET | Freq: Every day | ORAL | 3 refills | Status: DC

## 2016-05-20 NOTE — Patient Instructions (Signed)
Take the amlodipine for blood pressure 10 mg once a day Take the simvastatin 20 mg a day for cholesterol Get back on the metamucil.  It helps prevent diverticulitis Be sure to drink plenty of water  See me on usual schedule

## 2016-05-20 NOTE — Telephone Encounter (Signed)
Disregard last message. Wrong chart.

## 2016-05-20 NOTE — Telephone Encounter (Signed)
Left vm for patient to r/s her appt 07/21/16 due to a change in the schedule with Dr.Simpson.

## 2016-05-20 NOTE — Progress Notes (Signed)
Chief Complaint  Patient presents with  . Abdominal Pain    LLQ   Abdominal pain for two days Mon and Tues Gone Wed and today Cramps, uncomfortable No nausea or vomiting No fever or chills Moving bowels regularly History of diverticulitis   Patient Active Problem List   Diagnosis Date Noted  . Atherosclerosis of aorta ([place]) 03/25/2016  . Osteoporosis 03/25/2016  . Pacemaker 10/24/2014  . Diverticulosis of colon without hemorrhage 09/10/2013  . Vitamin B12 deficiency 07/09/2013  . Perforation of colon ([place]) 07/05/2013  . CKD (chronic kidney disease) stage 4, GFR 15-29 ml/min (HCC) 07/05/2013  . Systolic murmur 79/39/0300  . TOBACCO ABUSE 04/16/2009  . HLD (hyperlipidemia) 06/27/2008  . Essential hypertension 06/27/2008  . SICK SINUS/ TACHY-BRADY SYNDROME 06/27/2008  . PPM-St.Jude 06/27/2008    Outpatient Encounter Prescriptions as of 05/20/2016  Medication Sig  . amLODipine (NORVASC) 10 MG tablet Take 0.5 tablets (5 mg total) by mouth daily.  . simvastatin (ZOCOR) 20 MG tablet Take 1 tablet (20 mg total) by mouth at bedtime.  . traMADol (ULTRAM) 50 MG tablet Take 1 tablet (50 mg total) by mouth every 6 (six) hours as needed.  . [DISCONTINUED] amLODipine (NORVASC) 10 MG tablet Take 5 mg by mouth daily.   . [DISCONTINUED] simvastatin (ZOCOR) 20 MG tablet Take 20 mg by mouth at bedtime.    No facility-administered encounter medications on file as of 05/20/2016.     Allergies  Allergen Reactions  . Codeine Nausea And Vomiting    Sick to stomach  . Sulfonamide Derivatives Other (See Comments)    Tongue rash"circles on my tongue"    Review of Systems  Constitutional: Negative for chills and fever.  HENT: Negative for congestion, dental problem and trouble swallowing.   Respiratory: Negative for cough and shortness of breath.   Cardiovascular: Negative for chest pain and palpitations.  Gastrointestinal: Positive for abdominal pain. Negative for abdominal distention,  blood in stool, constipation, diarrhea, nausea and vomiting.  Genitourinary: Negative for dysuria and frequency.  Musculoskeletal: Negative for arthralgias and myalgias.  Neurological: Negative for dizziness and headaches.    BP 136/78 (BP Location: Right Arm, Patient Position: Sitting, Cuff Size: Normal)   Pulse 68   Temp 97.9 F (36.6 C) (Temporal)   Resp 18   Ht 5\' 3"  (1.6 m)   Wt 159 lb 1.3 oz (72.2 kg)   SpO2 98%   BMI 28.18 kg/m   Physical Exam  Constitutional: She appears well-developed and well-nourished.  HENT:  Head: Normocephalic and atraumatic.  Mouth/Throat: Oropharynx is clear and moist.  Eyes: Conjunctivae are normal. Pupils are equal, round, and reactive to light.  Neck: Normal range of motion. Neck supple.  Cardiovascular: Normal rate, regular rhythm and normal heart sounds.   Pulmonary/Chest: Effort normal and breath sounds normal. No respiratory distress.  Abdominal: Soft. Bowel sounds are normal.  NO tenderness  Musculoskeletal: Normal range of motion. She exhibits no edema.  Lymphadenopathy:    She has no cervical adenopathy.  Neurological: She is alert.  Gait normal  Skin: Skin is warm and dry.  Psychiatric: She has a normal mood and affect. Her behavior is normal. Thought content normal.  Nursing note and vitals reviewed.   ASSESSMENT/PLAN:  1. Left lower quadrant pain resolved.  Discussed warning signs of diverticulitis, fever, nausea, severe pain  2. Essential hypertension Controlled.  Needs med refill  3. TOBACCO ABUSE ongoing  4. Hyperlipidemia, unspecified hyperlipidemia type Needs refills med   Patient Instructions  Take the amlodipine for blood pressure 10 mg once a day Take the simvastatin 20 mg a day for cholesterol Get back on the metamucil.  It helps prevent diverticulitis Be sure to drink plenty of water  See me on usual schedule   Raylene Everts, MD

## 2016-05-21 ENCOUNTER — Ambulatory Visit: Payer: Medicare Other | Admitting: Family Medicine

## 2016-06-22 ENCOUNTER — Telehealth: Payer: Self-pay | Admitting: Family Medicine

## 2016-06-22 NOTE — Telephone Encounter (Signed)
[facility]   Patient daughter left voicemail that patient is not feeling well, has terrible cold and sore throat. Requesting medication  Cb#: [phone]

## 2016-06-22 NOTE — Telephone Encounter (Signed)
Called and spoke to daughter, advised if needed to take her mom to urgent care to be seen.

## 2016-06-23 DIAGNOSIS — J209 Acute bronchitis, unspecified: Secondary | ICD-10-CM | POA: Diagnosis not present

## 2016-06-25 ENCOUNTER — Encounter: Payer: Medicare Other | Admitting: Family Medicine

## 2016-07-02 ENCOUNTER — Ambulatory Visit (INDEPENDENT_AMBULATORY_CARE_PROVIDER_SITE_OTHER): Payer: Medicare Other | Admitting: *Deleted

## 2016-07-02 DIAGNOSIS — I495 Sick sinus syndrome: Secondary | ICD-10-CM | POA: Diagnosis not present

## 2016-07-05 NOTE — Progress Notes (Signed)
Remote pacemaker transmission.   

## 2016-07-06 ENCOUNTER — Ambulatory Visit (INDEPENDENT_AMBULATORY_CARE_PROVIDER_SITE_OTHER): Payer: Medicare Other | Admitting: Family Medicine

## 2016-07-06 ENCOUNTER — Encounter: Payer: Self-pay | Admitting: Family Medicine

## 2016-07-06 ENCOUNTER — Encounter: Payer: Medicare Other | Admitting: Family Medicine

## 2016-07-06 VITALS — BP 112/70 | HR 72 | Temp 97.6°F | Resp 16 | Ht 63.0 in | Wt 159.1 lb

## 2016-07-06 DIAGNOSIS — J029 Acute pharyngitis, unspecified: Secondary | ICD-10-CM | POA: Diagnosis not present

## 2016-07-06 DIAGNOSIS — J039 Acute tonsillitis, unspecified: Secondary | ICD-10-CM

## 2016-07-06 DIAGNOSIS — E559 Vitamin D deficiency, unspecified: Secondary | ICD-10-CM | POA: Insufficient documentation

## 2016-07-06 MED ORDER — PENICILLIN V POTASSIUM 500 MG PO TABS: 500.0000 mg | ORAL_TABLET | Freq: Four times a day (QID) | ORAL | 0 refills | Status: DC

## 2016-07-06 NOTE — Patient Instructions (Signed)
Take the penicillin as directed drink plenty of water See me on usual schedule ( every 3 moths )

## 2016-07-06 NOTE — Progress Notes (Signed)
Chief Complaint  Patient presents with  . Sore Throat    x 1 week   As above Pain with swallowing for a week Only the left side of her throat "feels ike a boil" Mild ear pressure No runny or stuffy nose, no sinus congestion or PND No increased cough or sputum Feels tired, chills, had not taken temp  Patient Active Problem List   Diagnosis Date Noted  . Vitamin D deficiency 07/06/2016  . Atherosclerosis of aorta ([place]) 03/25/2016  . Osteoporosis 03/25/2016  . Pacemaker 10/24/2014  . Diverticulosis of colon without hemorrhage 09/10/2013  . Vitamin B12 deficiency 07/09/2013  . CKD (chronic kidney disease) stage 4, GFR 15-29 ml/min (HCC) 07/05/2013  . Systolic murmur 35/36/1443  . TOBACCO ABUSE 04/16/2009  . HLD (hyperlipidemia) 06/27/2008  . Essential hypertension 06/27/2008  . SICK SINUS/ TACHY-BRADY SYNDROME 06/27/2008  . PPM-St.Jude 06/27/2008    Outpatient Encounter Prescriptions as of 07/06/2016  Medication Sig  . amLODipine (NORVASC) 10 MG tablet Take 0.5 tablets (5 mg total) by mouth daily.  . simvastatin (ZOCOR) 20 MG tablet Take 1 tablet (20 mg total) by mouth at bedtime.  . traMADol (ULTRAM) 50 MG tablet Take 1 tablet (50 mg total) by mouth every 6 (six) hours as needed.  . penicillin v potassium (VEETID) 500 MG tablet Take 1 tablet (500 mg total) by mouth 4 (four) times daily.   No facility-administered encounter medications on file as of 07/06/2016.     Allergies  Allergen Reactions  . Codeine Nausea And Vomiting    Sick to stomach  . Sulfonamide Derivatives Other (See Comments)    Tongue rash"circles on my tongue"    Review of Systems  Constitutional: Positive for chills and fatigue. Negative for fever.  HENT: Positive for ear pain and sore throat. Negative for congestion, postnasal drip, rhinorrhea, sinus pressure and trouble swallowing.   Eyes: Negative for redness and visual disturbance.  Respiratory: Negative for cough and shortness of breath.     Cardiovascular: Negative for palpitations and leg swelling.  Gastrointestinal: Negative for constipation, diarrhea and nausea.  Musculoskeletal: Negative for arthralgias and myalgias.  Neurological: Negative for dizziness and headaches.  Hematological: Negative for adenopathy.  Psychiatric/Behavioral: Negative for sleep disturbance. The patient is not nervous/anxious.    BP 112/70 (BP Location: Right Arm, Patient Position: Sitting, Cuff Size: Normal)   Pulse 72   Temp 97.6 F (36.4 C) (Temporal)   Resp 16   Ht 5\' 3"  (1.6 m)   Wt 159 lb 1.9 oz (72.2 kg)   SpO2 97%   BMI 28.19 kg/m   Physical Exam  Constitutional: She appears well-developed and well-nourished. No distress.  HENT:  Head: Normocephalic and atraumatic.  Right Ear: External ear normal.  Left Ear: External ear normal.  Nose: Nose normal.  Mouth/Throat: No oropharyngeal exudate.  Left tonsil swollen and injected, no exudate. No adenopathy  Eyes: Conjunctivae and EOM are normal. Pupils are equal, round, and reactive to light.  Neck: Normal range of motion. Neck supple.  Cardiovascular: Normal rate, regular rhythm and normal heart sounds.   Pulmonary/Chest: Effort normal and breath sounds normal. No respiratory distress. She has no wheezes. She has no rales.  Lymphadenopathy:    She has no cervical adenopathy.  Neurological: She is alert.  Psychiatric: She has a normal mood and affect. Her behavior is normal.    ASSESSMENT/PLAN:  1. Tonsillopharyngitis Unilateral tonsil swelling and pain.  No abscess identified, no trouble swallowing.  Will try antibiotic  Patient Instructions  Take the penicillin as directed drink plenty of water See me on usual schedule ( every 3 moths )   Raylene Everts, MD

## 2016-07-07 ENCOUNTER — Encounter: Payer: Self-pay | Admitting: Cardiology

## 2016-07-07 LAB — CUP PACEART REMOTE DEVICE CHECK
Brady Statistic AP VP Percent: 1 %
Brady Statistic AP VS Percent: 56 %
Brady Statistic AS VP Percent: 1 %
Brady Statistic RV Percent Paced: 1 %
Date Time Interrogation Session: 20180622154732
Implantable Lead Implant Date: 20161013
Implantable Lead Location: 753859
Lead Channel Impedance Value: 460 Ohm
Lead Channel Pacing Threshold Amplitude: 0.75 V
Lead Channel Pacing Threshold Amplitude: 0.75 V
Lead Channel Pacing Threshold Pulse Width: 0.5 ms
Lead Channel Sensing Intrinsic Amplitude: 12 mV
Lead Channel Setting Pacing Amplitude: 2 V
Lead Channel Setting Pacing Amplitude: 2.5 V
Lead Channel Setting Pacing Pulse Width: 0.5 ms
Lead Channel Setting Sensing Sensitivity: 2 mV
MDC IDC LEAD IMPLANT DT: 20161013
MDC IDC LEAD LOCATION: 753860
MDC IDC MSMT BATTERY REMAINING LONGEVITY: 115 mo
MDC IDC MSMT BATTERY REMAINING PERCENTAGE: 95.5 %
MDC IDC MSMT BATTERY VOLTAGE: 3.01 V
MDC IDC MSMT LEADCHNL RA IMPEDANCE VALUE: 390 Ohm
MDC IDC MSMT LEADCHNL RA PACING THRESHOLD PULSEWIDTH: 0.5 ms
MDC IDC MSMT LEADCHNL RA SENSING INTR AMPL: 2.5 mV
MDC IDC PG IMPLANT DT: 20161013
MDC IDC STAT BRADY AS VS PERCENT: 44 %
MDC IDC STAT BRADY RA PERCENT PACED: 56 %
Pulse Gen Model: 2240
Pulse Gen Serial Number: 7820884

## 2016-07-15 ENCOUNTER — Telehealth: Payer: Self-pay | Admitting: *Deleted

## 2016-07-15 NOTE — Telephone Encounter (Signed)
Patient called stating her right ear is really stopped up, patient stated other than that she is feeling much better. Patient said her [place] Please advise

## 2016-07-15 NOTE — Telephone Encounter (Signed)
Pt aware.

## 2016-07-15 NOTE — Telephone Encounter (Signed)
Spoke to daughter, number on file was for daughter, did not give pt info out, but daughter states patient is confused and forgetful and she is planning on making patient appt today when she comes in herself.

## 2016-07-15 NOTE — Telephone Encounter (Signed)
I looked In her ears - she does not have wax buildup.  The sensation of feeling stopped up is eustachian tube dysfunction and should improve.  Consider nasal saline or brief course of nasal decongestant to help with symptoms.

## 2016-07-21 ENCOUNTER — Telehealth: Payer: Self-pay | Admitting: Family Medicine

## 2016-07-21 MED ORDER — AMLODIPINE BESYLATE 10 MG PO TABS: 5.0000 mg | ORAL_TABLET | Freq: Every day | ORAL | 3 refills | Status: DC

## 2016-07-21 NOTE — Telephone Encounter (Signed)
Medication sent to pharmacy  

## 2016-07-21 NOTE — Telephone Encounter (Signed)
New Message  Pt voiced she is needing refills on amLODipine (NORVASC) 10 MG tablet and pharmacy is: Pharmacy: [facility], [place]

## 2016-07-21 NOTE — Telephone Encounter (Signed)
Pt voiced she would like for it to be delivered.

## 2016-08-03 DIAGNOSIS — E785 Hyperlipidemia, unspecified: Secondary | ICD-10-CM | POA: Diagnosis not present

## 2016-08-03 DIAGNOSIS — Z720 Tobacco use: Secondary | ICD-10-CM | POA: Diagnosis not present

## 2016-08-03 DIAGNOSIS — N183 Chronic kidney disease, stage 3 (moderate): Secondary | ICD-10-CM | POA: Diagnosis not present

## 2016-08-03 DIAGNOSIS — D351 Benign neoplasm of parathyroid gland: Secondary | ICD-10-CM | POA: Diagnosis not present

## 2016-08-03 DIAGNOSIS — I10 Essential (primary) hypertension: Secondary | ICD-10-CM | POA: Diagnosis not present

## 2016-08-13 ENCOUNTER — Telehealth: Payer: Self-pay | Admitting: Family Medicine

## 2016-08-13 ENCOUNTER — Ambulatory Visit: Payer: Medicare Other | Admitting: Family Medicine

## 2016-08-13 NOTE — Telephone Encounter (Signed)
Cancel and re schedule, thanks

## 2016-08-13 NOTE — Telephone Encounter (Signed)
Patient called nurse line to cancel appt. It is within 24 hours, do we cancel and take off schedule or leave as no show. She states the rain is too bad for her to come out in. She states she will call back later to reschedule.

## 2016-08-17 ENCOUNTER — Encounter: Payer: Self-pay | Admitting: Family Medicine

## 2016-08-17 ENCOUNTER — Ambulatory Visit (INDEPENDENT_AMBULATORY_CARE_PROVIDER_SITE_OTHER): Payer: Medicare Other | Admitting: Family Medicine

## 2016-08-17 VITALS — BP 126/78 | HR 72 | Temp 97.7°F | Resp 16 | Ht 63.0 in | Wt 157.0 lb

## 2016-08-17 DIAGNOSIS — G3184 Mild cognitive impairment, so stated: Secondary | ICD-10-CM | POA: Insufficient documentation

## 2016-08-17 NOTE — Progress Notes (Signed)
Chief Complaint  Patient presents with  . Memory Loss    short term memory   Worried about memory loss Her daughters say she repeats her self and does not remember Scared of developing Alzheimers She lives alone.  Does not forget appointments.  Is always well dressed and groomed.  Pays her own bills.  Does her own housework.  Does not have trouble with recipes or cooking.  Has not left on the stove.  Has a car and drives.  Drove herself to a specialist appointment in [place] recently.  Has not gotten lost.  Does not feel depressed.  Somewhat anxious, "i worry about my kids".  Wants to live independently.  Gradual onset.  No recent illness or change in medicines.  Patient Active Problem List   Diagnosis Date Noted  . Vitamin D deficiency 07/06/2016  . Atherosclerosis of aorta ([place]) 03/25/2016  . Osteoporosis 03/25/2016  . Pacemaker 10/24/2014  . Diverticulosis of colon without hemorrhage 09/10/2013  . Vitamin B12 deficiency 07/09/2013  . CKD (chronic kidney disease) stage 4, GFR 15-29 ml/min (HCC) 07/05/2013  . Systolic murmur 69/62/9528  . TOBACCO ABUSE 04/16/2009  . HLD (hyperlipidemia) 06/27/2008  . Essential hypertension 06/27/2008  . SICK SINUS/ TACHY-BRADY SYNDROME 06/27/2008  . PPM-St.Jude 06/27/2008    Outpatient Encounter Prescriptions as of 08/17/2016  Medication Sig  . amLODipine (NORVASC) 10 MG tablet Take 0.5 tablets (5 mg total) by mouth daily.  . penicillin v potassium (VEETID) 500 MG tablet Take 1 tablet (500 mg total) by mouth 4 (four) times daily.  . simvastatin (ZOCOR) 20 MG tablet Take 1 tablet (20 mg total) by mouth at bedtime.  . traMADol (ULTRAM) 50 MG tablet Take 1 tablet (50 mg total) by mouth every 6 (six) hours as needed.   No facility-administered encounter medications on file as of 08/17/2016.     Allergies  Allergen Reactions  . Codeine Nausea And Vomiting    Sick to stomach  . Sulfonamide Derivatives Other (See Comments)    Tongue  rash"circles on my tongue"    Review of Systems  Constitutional: Positive for unexpected weight change. Negative for activity change and appetite change.       Lost a few pounds  HENT: Negative for congestion, dental problem, postnasal drip and rhinorrhea.   Eyes: Negative for redness and visual disturbance.  Respiratory: Negative for cough and shortness of breath.   Cardiovascular: Negative for chest pain, palpitations and leg swelling.  Gastrointestinal: Negative for abdominal pain, constipation and diarrhea.  Genitourinary: Negative for difficulty urinating and frequency.  Musculoskeletal: Negative for arthralgias and back pain.  Neurological: Negative for dizziness and headaches.  Hematological: Bruises/bleeds easily.  Psychiatric/Behavioral: Positive for decreased concentration. Negative for dysphoric mood and sleep disturbance. The patient is not nervous/anxious.     BP 126/78 (BP Location: Left Arm, Patient Position: Sitting, Cuff Size: Normal)   Pulse 72   Temp 97.7 F (36.5 C) (Temporal)   Resp 16   Ht 5\' 3"  (1.6 m)   Wt 157 lb (71.2 kg)   SpO2 97%   BMI 27.81 kg/m   Physical Exam  Constitutional: She is oriented to person, place, and time. She appears well-developed and well-nourished. No distress.  HENT:  Head: Normocephalic and atraumatic.  Mouth/Throat: Oropharynx is clear and moist.  Cardiovascular: Normal rate, regular rhythm and normal heart sounds.   Pulmonary/Chest: Effort normal and breath sounds normal.  Neurological: She is alert and oriented to person, place, and time. She displays  normal reflexes. No sensory deficit. She exhibits normal muscle tone. Coordination normal.  Psychiatric: She has a normal mood and affect. Her behavior is normal. Thought content normal.  I did a mini cognitive evaluation.  She remembered 2/3 words after 5 minutes.  Her clock drawing was abnormal, and she could not place the hands correctly when asked.  Her clock was abnormal out  of proportion to her clinical conversation and ability - which seem normal for age.  ASSESSMENT/PLAN:  1. Mild cognitive impairment, so stated No red flags for delirium.  Normal neuro exam.  Worrisome clock drawing, but no safety concerns.  Will follow.   Patient Instructions  See me on usual schedule  Call for problems or refills   Raylene Everts, MD

## 2016-08-17 NOTE — Patient Instructions (Signed)
See me on usual schedule  Call for problems or refills

## 2016-09-08 ENCOUNTER — Other Ambulatory Visit: Payer: Self-pay

## 2016-10-01 ENCOUNTER — Ambulatory Visit (INDEPENDENT_AMBULATORY_CARE_PROVIDER_SITE_OTHER): Payer: Medicare Other | Admitting: Family Medicine

## 2016-10-01 ENCOUNTER — Encounter: Payer: Self-pay | Admitting: Family Medicine

## 2016-10-01 VITALS — BP 122/80 | HR 70 | Temp 98.1°F | Resp 18 | Ht 63.0 in | Wt 155.1 lb

## 2016-10-01 DIAGNOSIS — Z23 Encounter for immunization: Secondary | ICD-10-CM | POA: Diagnosis not present

## 2016-10-01 DIAGNOSIS — Z87898 Personal history of other specified conditions: Secondary | ICD-10-CM | POA: Diagnosis not present

## 2016-10-01 DIAGNOSIS — R3121 Asymptomatic microscopic hematuria: Secondary | ICD-10-CM

## 2016-10-01 LAB — POCT URINALYSIS DIPSTICK
Glucose, UA: NEGATIVE
Nitrite, UA: NEGATIVE
PROTEIN UA: 100
Spec Grav, UA: 1.02 (ref 1.010–1.025)
Urobilinogen, UA: 0.2 E.U./dL
pH, UA: 5.5 (ref 5.0–8.0)

## 2016-10-01 NOTE — Progress Notes (Signed)
Chief Complaint  Patient presents with  . Pain    left side  monday and tuesday  remote history of kidney stones, none many years Today with history of left flank pain for 2 days that ws quite severe.  Worse with deep breath and movement.  Better with rest.  Took a half of a tramadol on 2 occasions.  It is now gone. No NVD.  NO fever or chills No urine symptoms or hematuria Continues to smoke No mid back or rib trauma.   Patient Active Problem List   Diagnosis Date Noted  . Mild cognitive impairment, so stated 08/17/2016  . Vitamin D deficiency 07/06/2016  . Atherosclerosis of aorta ([place]) 03/25/2016  . Osteoporosis 03/25/2016  . Pacemaker 10/24/2014  . Diverticulosis of colon without hemorrhage 09/10/2013  . Vitamin B12 deficiency 07/09/2013  . CKD (chronic kidney disease) stage 4, GFR 15-29 ml/min (HCC) 07/05/2013  . Systolic murmur 28/31/5176  . TOBACCO ABUSE 04/16/2009  . HLD (hyperlipidemia) 06/27/2008  . Essential hypertension 06/27/2008  . SICK SINUS/ TACHY-BRADY SYNDROME 06/27/2008  . PPM-St.Jude 06/27/2008    Outpatient Encounter Prescriptions as of 10/01/2016  Medication Sig  . amLODipine (NORVASC) 10 MG tablet Take 0.5 tablets (5 mg total) by mouth daily.  . simvastatin (ZOCOR) 20 MG tablet Take 1 tablet (20 mg total) by mouth at bedtime.  . traMADol (ULTRAM) 50 MG tablet Take 1 tablet (50 mg total) by mouth every 6 (six) hours as needed.   No facility-administered encounter medications on file as of 10/01/2016.     Allergies  Allergen Reactions  . Codeine Nausea And Vomiting    Sick to stomach  . Sulfonamide Derivatives Other (See Comments)    Tongue rash"circles on my tongue"   Review of Systems  Constitutional: Negative for activity change, appetite change, chills and fever.  HENT: Negative for congestion and dental problem.   Eyes: Negative for photophobia.  Respiratory: Negative for cough and shortness of breath.   Gastrointestinal: Negative for  abdominal pain, diarrhea, nausea and vomiting.  Genitourinary: Positive for flank pain. Negative for dysuria and hematuria.  Musculoskeletal: Negative for back pain.    BP 122/80 (BP Location: Right Arm, Patient Position: Sitting, Cuff Size: Normal)   Pulse 70   Temp 98.1 F (36.7 C) (Temporal)   Resp 18   Ht 5\' 3"  (1.6 m)   Wt 155 lb 1.3 oz (70.3 kg)   SpO2 97%   BMI 27.47 kg/m   Physical Exam  Constitutional: She appears well-developed and well-nourished. No distress.  HENT:  Head: Normocephalic and atraumatic.  Mouth/Throat: Oropharynx is clear and moist.  Eyes: Pupils are equal, round, and reactive to light. Conjunctivae are normal.  Neck: Normal range of motion. Neck supple. No thyromegaly present.  Cardiovascular: Normal rate, regular rhythm and normal heart sounds.   Pulmonary/Chest: Effort normal and breath sounds normal. She has no rales.  Abdominal: Soft. Bowel sounds are normal. She exhibits no distension. There is no tenderness. There is no guarding.  Lymphadenopathy:    She has no cervical adenopathy.  Psychiatric: She has a normal mood and affect. Her behavior is normal.  NO CVAT,  NO rib tenderness  Results for orders placed or performed in visit on 10/01/16  POCT urinalysis dipstick  Result Value Ref Range   Color, UA yellow    Clarity, UA clear    Glucose, UA neg    Bilirubin, UA small    Ketones, UA trace    Spec  Grav, UA 1.020 1.010 - 1.025   Blood, UA mod    pH, UA 5.5 5.0 - 8.0   Protein, UA 100    Urobilinogen, UA 0.2 0.2 or 1.0 E.U./dL   Nitrite, UA neg    Leukocytes, UA Trace (A) Negative    ASSESSMENT/PLAN:  1. Need for influenza vaccination (Duplicate)  2. Need for pneumococcal vaccination - Pneumococcal conjugate vaccine 13-valent IM  3. History of left flank pain - POCT urinalysis dipstick - Urine Culture  4. Asymptomatic microscopic hematuria - POCT urinalysis dipstick - Urine Culture  5. Need for immunization against  influenza - Flu Vaccine QUAD 36+ mos IM   Patient Instructions  Drink plenty of water Take tramadol if needed  Come back in 2 weeks for a check of the urine again   Raylene Everts, MD

## 2016-10-01 NOTE — Patient Instructions (Addendum)
Drink plenty of water Take tramadol if needed  Come back in 2 weeks for a check of the urine again

## 2016-10-03 LAB — URINE CULTURE
MICRO NUMBER:: 81048861
SPECIMEN QUALITY: ADEQUATE

## 2016-10-04 ENCOUNTER — Ambulatory Visit (INDEPENDENT_AMBULATORY_CARE_PROVIDER_SITE_OTHER): Payer: Medicare Other | Admitting: *Deleted

## 2016-10-04 DIAGNOSIS — I495 Sick sinus syndrome: Secondary | ICD-10-CM

## 2016-10-04 NOTE — Progress Notes (Signed)
Remote pacemaker transmission.   

## 2016-10-07 ENCOUNTER — Encounter: Payer: Self-pay | Admitting: Cardiology

## 2016-10-07 LAB — CUP PACEART REMOTE DEVICE CHECK
Battery Remaining Percentage: 95.5 %
Battery Voltage: 3.01 V
Brady Statistic AP VP Percent: 1 %
Brady Statistic RA Percent Paced: 56 %
Implantable Lead Implant Date: 20161013
Implantable Lead Location: 753859
Implantable Pulse Generator Implant Date: 20161013
Lead Channel Impedance Value: 490 Ohm
Lead Channel Setting Pacing Amplitude: 2.5 V
Lead Channel Setting Pacing Pulse Width: 0.5 ms
Lead Channel Setting Sensing Sensitivity: 2 mV
MDC IDC LEAD IMPLANT DT: 20161013
MDC IDC LEAD LOCATION: 753860
MDC IDC MSMT BATTERY REMAINING LONGEVITY: 116 mo
MDC IDC MSMT LEADCHNL RA IMPEDANCE VALUE: 400 Ohm
MDC IDC MSMT LEADCHNL RA PACING THRESHOLD AMPLITUDE: 0.75 V
MDC IDC MSMT LEADCHNL RA PACING THRESHOLD PULSEWIDTH: 0.5 ms
MDC IDC MSMT LEADCHNL RA SENSING INTR AMPL: 1.3 mV
MDC IDC MSMT LEADCHNL RV PACING THRESHOLD AMPLITUDE: 0.75 V
MDC IDC MSMT LEADCHNL RV PACING THRESHOLD PULSEWIDTH: 0.5 ms
MDC IDC MSMT LEADCHNL RV SENSING INTR AMPL: 12 mV
MDC IDC SESS DTM: 20180924091914
MDC IDC SET LEADCHNL RA PACING AMPLITUDE: 2 V
MDC IDC STAT BRADY AP VS PERCENT: 57 %
MDC IDC STAT BRADY AS VP PERCENT: 1 %
MDC IDC STAT BRADY AS VS PERCENT: 43 %
MDC IDC STAT BRADY RV PERCENT PACED: 1 %
Pulse Gen Serial Number: 7820884

## 2016-10-15 ENCOUNTER — Telehealth: Payer: Self-pay

## 2016-10-15 ENCOUNTER — Ambulatory Visit (INDEPENDENT_AMBULATORY_CARE_PROVIDER_SITE_OTHER): Payer: Medicare Other

## 2016-10-15 DIAGNOSIS — M545 Low back pain, unspecified: Secondary | ICD-10-CM

## 2016-10-15 LAB — POCT URINALYSIS DIPSTICK
Glucose, UA: NEGATIVE
Nitrite, UA: NEGATIVE
Protein, UA: 100
Spec Grav, UA: 1.02
Urobilinogen, UA: 0.2 U/dL
pH, UA: 5.5

## 2016-10-15 NOTE — Telephone Encounter (Signed)
See poct ua results

## 2016-10-25 ENCOUNTER — Telehealth: Payer: Self-pay | Admitting: Family Medicine

## 2016-10-25 NOTE — Telephone Encounter (Signed)
Called [facility], pt has refills on file there.Called [name], she has been taking a whole tablet instead of half.

## 2016-10-25 NOTE — Telephone Encounter (Signed)
Patient needs a refill of amlodipine. Cb#: [phone]

## 2016-10-25 NOTE — Telephone Encounter (Signed)
Noted.  Need to repeat with culture if has symptoms.  Her cultures are usually negative

## 2016-10-26 NOTE — Telephone Encounter (Signed)
Left message to call office

## 2016-10-28 ENCOUNTER — Ambulatory Visit (INDEPENDENT_AMBULATORY_CARE_PROVIDER_SITE_OTHER): Payer: Medicare Other

## 2016-10-28 DIAGNOSIS — Z23 Encounter for immunization: Secondary | ICD-10-CM | POA: Diagnosis not present

## 2016-11-11 DIAGNOSIS — Z9842 Cataract extraction status, left eye: Secondary | ICD-10-CM | POA: Diagnosis not present

## 2016-11-11 DIAGNOSIS — Z961 Presence of intraocular lens: Secondary | ICD-10-CM | POA: Diagnosis not present

## 2016-11-11 DIAGNOSIS — H2511 Age-related nuclear cataract, right eye: Secondary | ICD-10-CM | POA: Diagnosis not present

## 2016-11-11 DIAGNOSIS — D3131 Benign neoplasm of right choroid: Secondary | ICD-10-CM | POA: Diagnosis not present

## 2016-12-14 ENCOUNTER — Encounter: Payer: Self-pay | Admitting: Family Medicine

## 2016-12-14 ENCOUNTER — Other Ambulatory Visit: Payer: Self-pay

## 2016-12-14 ENCOUNTER — Ambulatory Visit (INDEPENDENT_AMBULATORY_CARE_PROVIDER_SITE_OTHER): Payer: Medicare Other | Admitting: Family Medicine

## 2016-12-14 VITALS — BP 128/84 | HR 76 | Temp 97.6°F | Resp 18 | Ht 63.0 in | Wt 155.1 lb

## 2016-12-14 DIAGNOSIS — R059 Cough, unspecified: Secondary | ICD-10-CM

## 2016-12-14 DIAGNOSIS — R05 Cough: Secondary | ICD-10-CM | POA: Diagnosis not present

## 2016-12-14 MED ORDER — DM-GUAIFENESIN ER 30-600 MG PO TB12
1.0000 | ORAL_TABLET | Freq: Two times a day (BID) | ORAL | 0 refills | Status: DC
Start: 2016-12-14 — End: 2017-03-10

## 2016-12-14 MED ORDER — BENZONATATE 100 MG PO CAPS: 100.0000 mg | ORAL_CAPSULE | Freq: Two times a day (BID) | ORAL | 0 refills | Status: DC | PRN

## 2016-12-14 NOTE — Patient Instructions (Signed)
Rest Push fluids Reduce the smoking Take the mucinex DM twice a day This loosens mucous Take the tessalon as needed for cough Call if not better in 2-3 days, or if you feel wosre at any time

## 2016-12-14 NOTE — Progress Notes (Signed)
Chief Complaint  Patient presents with  . URI  Patient is here for cold symptoms.  She has cough cold runny nose.  No sore throat.  No fever or chills.  She is somewhat tired from coughing all night but no real malaise or body aches.  She is coughing up some thick sputum.  No color discoloration.  No hemoptysis.  No nausea or vomiting.  She took a "cold pill" to stop her runny nose.  Her biggest concern is her cough. She continues to smoke cigarettes.  This is discussed with her.  At 89 I am not sure we will change her habits, but I will continue to remind her that it is important for her health. She has early COPD by x-ray.  She is a lifelong smoker.  She does not complain of shortness of breath or wheezing.  Patient Active Problem List   Diagnosis Date Noted  . Mild cognitive impairment, so stated 08/17/2016  . Vitamin D deficiency 07/06/2016  . Atherosclerosis of aorta ([place]) 03/25/2016  . Osteoporosis 03/25/2016  . Pacemaker 10/24/2014  . Diverticulosis of colon without hemorrhage 09/10/2013  . Vitamin B12 deficiency 07/09/2013  . CKD (chronic kidney disease) stage 4, GFR 15-29 ml/min (HCC) 07/05/2013  . Systolic murmur 15/40/0867  . TOBACCO ABUSE 04/16/2009  . HLD (hyperlipidemia) 06/27/2008  . Essential hypertension 06/27/2008  . SICK SINUS/ TACHY-BRADY SYNDROME 06/27/2008  . PPM-St.Jude 06/27/2008    Outpatient Encounter Medications as of 12/14/2016  Medication Sig  . amLODipine (NORVASC) 10 MG tablet Take 0.5 tablets (5 mg total) by mouth daily.  . simvastatin (ZOCOR) 20 MG tablet Take 1 tablet (20 mg total) by mouth at bedtime.  . traMADol (ULTRAM) 50 MG tablet Take 1 tablet (50 mg total) by mouth every 6 (six) hours as needed.  . benzonatate (TESSALON) 100 MG capsule Take 1 capsule (100 mg total) by mouth 2 (two) times daily as needed for cough.  . dextromethorphan-guaiFENesin (MUCINEX DM) 30-600 MG 12hr tablet Take 1 tablet by mouth 2 (two) times daily.   No  facility-administered encounter medications on file as of 12/14/2016.     Allergies  Allergen Reactions  . Codeine Nausea And Vomiting    Sick to stomach  . Sulfonamide Derivatives Other (See Comments)    Tongue rash"circles on my tongue"    Review of Systems  Constitutional: Positive for fatigue. Negative for activity change, appetite change, chills, fever and unexpected weight change.  HENT: Positive for congestion, postnasal drip and rhinorrhea. Negative for sinus pressure, sinus pain and sore throat.   Eyes: Negative for redness and visual disturbance.  Respiratory: Positive for cough. Negative for shortness of breath and wheezing.   Cardiovascular: Negative for chest pain, palpitations and leg swelling.  Gastrointestinal: Negative for diarrhea, nausea and vomiting.  Psychiatric/Behavioral: Positive for sleep disturbance.   Complains of cold symptoms only   BP 128/84 (BP Location: Left Arm, Patient Position: Sitting, Cuff Size: Normal)   Pulse 76   Temp 97.6 F (36.4 C) (Temporal)   Resp 18   Ht 5\' 3"  (1.6 m)   Wt 155 lb 1.9 oz (70.4 kg)   SpO2 96%   BMI 27.48 kg/m   Physical Exam  Constitutional: She appears well-developed and well-nourished. No distress.  HENT:  Head: Normocephalic and atraumatic.  Mouth/Throat: Oropharynx is clear and moist.  Tonsils mildly injected.  Eyes: Conjunctivae are normal. Pupils are equal, round, and reactive to light.  Neck: Normal range of motion. Neck supple.  No thyromegaly present.  Cardiovascular: Normal rate and regular rhythm.  Murmur heard. Pulmonary/Chest: Effort normal and breath sounds normal. She has no rales.  Lungs are clear.  Musculoskeletal: Normal range of motion. She exhibits no edema.  Lymphadenopathy:    She has no cervical adenopathy.  Psychiatric: She has a normal mood and affect. Her behavior is normal.    ASSESSMENT/PLAN:  1. Cough Discussed that this is likely a virus.  No indications for antibiotics.   Discussed the need to avoid unnecessary antibiotics.  Will treat with conservative measures and over-the-counter Mucinex.  Tessalon for cough.  If she has worsening symptoms, fever, or does not improve then she perhaps would need antibiotics because of her underlying COPD.   Patient Instructions  Rest Push fluids Reduce the smoking Take the mucinex DM twice a day This loosens mucous Take the tessalon as needed for cough Call if not better in 2-3 days, or if you feel wosre at any time   Raylene Everts, MD

## 2016-12-20 ENCOUNTER — Emergency Department (HOSPITAL_COMMUNITY)
Admission: EM | Admit: 2016-12-20 | Discharge: 2016-12-20 | Disposition: A | Discharge status: Home / Self Care | Payer: Medicare Other | Attending: Emergency Medicine | Admitting: Emergency Medicine

## 2016-12-20 ENCOUNTER — Emergency Department (HOSPITAL_COMMUNITY): Payer: Medicare Other

## 2016-12-20 ENCOUNTER — Encounter (HOSPITAL_COMMUNITY): Payer: Self-pay | Admitting: Emergency Medicine

## 2016-12-20 DIAGNOSIS — F17210 Nicotine dependence, cigarettes, uncomplicated: Secondary | ICD-10-CM | POA: Insufficient documentation

## 2016-12-20 DIAGNOSIS — N184 Chronic kidney disease, stage 4 (severe): Secondary | ICD-10-CM | POA: Diagnosis not present

## 2016-12-20 DIAGNOSIS — R05 Cough: Secondary | ICD-10-CM | POA: Diagnosis not present

## 2016-12-20 DIAGNOSIS — J441 Chronic obstructive pulmonary disease with (acute) exacerbation: Secondary | ICD-10-CM | POA: Insufficient documentation

## 2016-12-20 DIAGNOSIS — Z79899 Other long term (current) drug therapy: Secondary | ICD-10-CM | POA: Insufficient documentation

## 2016-12-20 DIAGNOSIS — I129 Hypertensive chronic kidney disease with stage 1 through stage 4 chronic kidney disease, or unspecified chronic kidney disease: Secondary | ICD-10-CM | POA: Diagnosis not present

## 2016-12-20 MED ORDER — AEROCHAMBER Z-STAT PLUS/MEDIUM MISC: 1.0000 | Freq: Once | Status: DC

## 2016-12-20 MED ORDER — PREDNISONE 10 MG PO TABS
60.0000 mg | ORAL_TABLET | Freq: Once | ORAL | Status: AC
  Administered 2016-12-20: 05:00:00 60 mg via ORAL
  Filled 2016-12-20: qty 1

## 2016-12-20 MED ORDER — ALBUTEROL SULFATE HFA 108 (90 BASE) MCG/ACT IN AERS
2.0000 | INHALATION_SPRAY | Freq: Four times a day (QID) | RESPIRATORY_TRACT | Status: DC | PRN
  Administered 2016-12-20: 2 via RESPIRATORY_TRACT
  Filled 2016-12-20: qty 6.7

## 2016-12-20 MED ORDER — DOXYCYCLINE HYCLATE 100 MG PO TABS
100.0000 mg | ORAL_TABLET | Freq: Once | ORAL | Status: AC
  Administered 2016-12-20: 100 mg via ORAL
  Filled 2016-12-20: qty 1

## 2016-12-20 MED ORDER — DOXYCYCLINE HYCLATE 100 MG PO CAPS: 100.0000 mg | ORAL_CAPSULE | Freq: Two times a day (BID) | ORAL | 0 refills | Status: DC

## 2016-12-20 MED ORDER — IPRATROPIUM BROMIDE 0.02 % IN SOLN
0.5000 mg | Freq: Once | RESPIRATORY_TRACT | Status: AC
  Administered 2016-12-20: 0.5 mg via RESPIRATORY_TRACT
  Filled 2016-12-20: qty 2.5

## 2016-12-20 MED ORDER — PREDNISONE 20 MG PO TABS: ORAL_TABLET | ORAL | 0 refills | Status: DC

## 2016-12-20 MED ORDER — ALBUTEROL SULFATE (2.5 MG/3ML) 0.083% IN NEBU
5.0000 mg | INHALATION_SOLUTION | Freq: Once | RESPIRATORY_TRACT | Status: AC
  Administered 2016-12-20: 5 mg via RESPIRATORY_TRACT
  Filled 2016-12-20: qty 6

## 2016-12-20 NOTE — ED Notes (Signed)
Patient transported to X-ray 
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
## 2016-12-20 NOTE — Discharge Instructions (Signed)
Use the inhaler 2 puffs every 6 hrs as needed for shortness of breath or coughing. Use mucinex DM OTC for cough. Take the antibiotic (doxycycline) and prednisone until gone.  Recheck if you get a high fever, struggle to breathe or feel worse. STOP SMOKING!!!!

## 2016-12-20 NOTE — ED Notes (Addendum)
Pt ambulatory to wheel chair and wheeled to waiting room. Pt verbalized understanding of discharge instructions.

## 2016-12-20 NOTE — ED Notes (Signed)
Pt attempting to call family members for discharge.

## 2016-12-20 NOTE — ED Triage Notes (Signed)
Pt C/O cough that started Monday. Pt states that she went to her primary doctor on Monday and was given tessalon pearls. Pt has also been taking OTC robitussin. Pt denies CP and SOB.

## 2016-12-20 NOTE — ED Notes (Signed)
Pt speaking with son-in-law who states that "they are unable to come get pt." Pt being told to wait in waiting room.

## 2016-12-20 NOTE — ED Notes (Signed)
Pt unable to contact any family. Per pt she is "still going to attempt a few more family members."

## 2016-12-22 ENCOUNTER — Other Ambulatory Visit: Payer: Self-pay | Admitting: *Deleted
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
## 2016-12-23 ENCOUNTER — Encounter: Payer: Self-pay | Admitting: *Deleted

## 2016-12-29 ENCOUNTER — Telehealth: Payer: Self-pay | Admitting: Family Medicine
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
## 2016-12-29 NOTE — Telephone Encounter (Signed)
Called and spoke to Ashley Olsen. She went to [facility] ED on 12 9 18. They gave her abx and prednisone, which she is still taking. Ashley Olsen is concerned about leaving her home and going with her daughter for 2 weeks...she would prefer to stay home. She has a ventolin inhaler to use if she needs it. She does not feel she needs to go to urgent care or ED. Will call office if she needs to be seen.

## 2016-12-31 DIAGNOSIS — J189 Pneumonia, unspecified organism: Secondary | ICD-10-CM | POA: Diagnosis not present

## 2016-12-31 DIAGNOSIS — J069 Acute upper respiratory infection, unspecified: Secondary | ICD-10-CM | POA: Diagnosis not present

## 2017-01-05 ENCOUNTER — Ambulatory Visit (INDEPENDENT_AMBULATORY_CARE_PROVIDER_SITE_OTHER): Payer: Medicare Other | Admitting: *Deleted

## 2017-01-05 DIAGNOSIS — I495 Sick sinus syndrome: Secondary | ICD-10-CM

## 2017-01-05 NOTE — Progress Notes (Signed)
Remote pacemaker transmission.   

## 2017-01-06 ENCOUNTER — Encounter: Payer: Self-pay | Admitting: Cardiology

## 2017-01-17 LAB — CUP PACEART REMOTE DEVICE CHECK
Battery Remaining Longevity: 114 mo
Battery Remaining Percentage: 95.5 %
Battery Voltage: 3.01 V
Brady Statistic AS VP Percent: 1 %
Brady Statistic RA Percent Paced: 58 %
Date Time Interrogation Session: 20181226100538
Implantable Lead Implant Date: 20161013
Implantable Lead Location: 753860
Lead Channel Impedance Value: 380 Ohm
Lead Channel Pacing Threshold Amplitude: 0.75 V
Lead Channel Pacing Threshold Pulse Width: 0.5 ms
Lead Channel Sensing Intrinsic Amplitude: 1.6 mV
Lead Channel Setting Pacing Amplitude: 2 V
Lead Channel Setting Sensing Sensitivity: 2 mV
MDC IDC LEAD IMPLANT DT: 20161013
MDC IDC LEAD LOCATION: 753859
MDC IDC MSMT LEADCHNL RV IMPEDANCE VALUE: 460 Ohm
MDC IDC MSMT LEADCHNL RV PACING THRESHOLD AMPLITUDE: 0.75 V
MDC IDC MSMT LEADCHNL RV PACING THRESHOLD PULSEWIDTH: 0.5 ms
MDC IDC MSMT LEADCHNL RV SENSING INTR AMPL: 12 mV
MDC IDC PG IMPLANT DT: 20161013
MDC IDC SET LEADCHNL RV PACING AMPLITUDE: 2.5 V
MDC IDC SET LEADCHNL RV PACING PULSEWIDTH: 0.5 ms
MDC IDC STAT BRADY AP VP PERCENT: 1 %
MDC IDC STAT BRADY AP VS PERCENT: 58 %
MDC IDC STAT BRADY AS VS PERCENT: 41 %
MDC IDC STAT BRADY RV PERCENT PACED: 1 %
Pulse Gen Model: 2240
Pulse Gen Serial Number: 7820884

## 2017-02-11 ENCOUNTER — Encounter: Payer: Medicare Other | Admitting: Internal Medicine

## 2017-02-28 DIAGNOSIS — M546 Pain in thoracic spine: Secondary | ICD-10-CM | POA: Diagnosis not present

## 2017-03-03 ENCOUNTER — Emergency Department (HOSPITAL_COMMUNITY)
Admission: EM | Admit: 2017-03-03 | Discharge: 2017-03-03 | Disposition: A | Discharge status: Home / Self Care | Payer: Medicare Other | Attending: Emergency Medicine | Admitting: Emergency Medicine

## 2017-03-03 ENCOUNTER — Encounter (HOSPITAL_COMMUNITY): Payer: Self-pay | Admitting: Emergency Medicine

## 2017-03-03 ENCOUNTER — Emergency Department (HOSPITAL_COMMUNITY): Payer: Medicare Other

## 2017-03-03 DIAGNOSIS — Z79899 Other long term (current) drug therapy: Secondary | ICD-10-CM | POA: Diagnosis not present

## 2017-03-03 DIAGNOSIS — R079 Chest pain, unspecified: Secondary | ICD-10-CM | POA: Diagnosis not present

## 2017-03-03 DIAGNOSIS — M62830 Muscle spasm of back: Secondary | ICD-10-CM | POA: Insufficient documentation

## 2017-03-03 DIAGNOSIS — I10 Essential (primary) hypertension: Secondary | ICD-10-CM | POA: Diagnosis not present

## 2017-03-03 DIAGNOSIS — I129 Hypertensive chronic kidney disease with stage 1 through stage 4 chronic kidney disease, or unspecified chronic kidney disease: Secondary | ICD-10-CM | POA: Insufficient documentation

## 2017-03-03 DIAGNOSIS — Z950 Presence of cardiac pacemaker: Secondary | ICD-10-CM | POA: Diagnosis not present

## 2017-03-03 DIAGNOSIS — I2510 Atherosclerotic heart disease of native coronary artery without angina pectoris: Secondary | ICD-10-CM | POA: Diagnosis not present

## 2017-03-03 DIAGNOSIS — F17210 Nicotine dependence, cigarettes, uncomplicated: Secondary | ICD-10-CM | POA: Diagnosis not present

## 2017-03-03 DIAGNOSIS — N184 Chronic kidney disease, stage 4 (severe): Secondary | ICD-10-CM | POA: Insufficient documentation

## 2017-03-03 DIAGNOSIS — R0789 Other chest pain: Secondary | ICD-10-CM | POA: Insufficient documentation

## 2017-03-03 DIAGNOSIS — R05 Cough: Secondary | ICD-10-CM | POA: Diagnosis not present

## 2017-03-03 DIAGNOSIS — M546 Pain in thoracic spine: Secondary | ICD-10-CM | POA: Diagnosis present

## 2017-03-03 LAB — COMPREHENSIVE METABOLIC PANEL
ALBUMIN: 3.9 g/dL (ref 3.5–5.0)
ALT: 13 U/L — AB (ref 14–54)
AST: 19 U/L (ref 15–41)
Alkaline Phosphatase: 65 U/L (ref 38–126)
Anion gap: 12 (ref 5–15)
BILIRUBIN TOTAL: 0.4 mg/dL (ref 0.3–1.2)
BUN: 25 mg/dL — AB (ref 6–20)
CALCIUM: 9.1 mg/dL (ref 8.9–10.3)
CO2: 23 mmol/L (ref 22–32)
CREATININE: 1.88 mg/dL — AB (ref 0.44–1.00)
Chloride: 104 mmol/L (ref 101–111)
GFR calc Af Amer: 27 mL/min — ABNORMAL LOW (ref >60)
GFR calc non Af Amer: 24 mL/min — ABNORMAL LOW (ref >60)
GLUCOSE: 151 mg/dL — AB (ref 65–99)
Potassium: 3.5 mmol/L (ref 3.5–5.1)
SODIUM: 139 mmol/L (ref 135–145)
Total Protein: 7.2 g/dL (ref 6.5–8.1)

## 2017-03-03 LAB — URINALYSIS, ROUTINE W REFLEX MICROSCOPIC
Bilirubin Urine: NEGATIVE
Glucose, UA: NEGATIVE mg/dL
Ketones, ur: NEGATIVE mg/dL
NITRITE: NEGATIVE
PH: 5 (ref 5.0–8.0)
Protein, ur: 100 mg/dL — AB
SPECIFIC GRAVITY, URINE: 1.018 (ref 1.005–1.030)

## 2017-03-03 LAB — CBC WITH DIFFERENTIAL/PLATELET
BASOS PCT: 0 %
Basophils Absolute: 0 10*3/uL (ref 0.0–0.1)
EOS ABS: 0 10*3/uL (ref 0.0–0.7)
Eosinophils Relative: 0 %
HEMATOCRIT: 42.2 % (ref 36.0–46.0)
Hemoglobin: 13.2 g/dL (ref 12.0–15.0)
Lymphocytes Relative: 8 %
Lymphs Abs: 0.7 10*3/uL (ref 0.7–4.0)
MCH: 28.9 pg (ref 26.0–34.0)
MCHC: 31.3 g/dL (ref 30.0–36.0)
MCV: 92.3 fL (ref 78.0–100.0)
Monocytes Absolute: 0.1 10*3/uL (ref 0.1–1.0)
Monocytes Relative: 1 %
NEUTROS ABS: 7.2 10*3/uL (ref 1.7–7.7)
NEUTROS PCT: 91 %
Platelets: 174 10*3/uL (ref 150–400)
RBC: 4.57 MIL/uL (ref 3.87–5.11)
RDW: 13.8 % (ref 11.5–15.5)
WBC: 7.9 10*3/uL (ref 4.0–10.5)

## 2017-03-03 LAB — TROPONIN I: Troponin I: <0.03 ng/mL (ref <0.03)

## 2017-03-03 LAB — LIPASE, BLOOD: Lipase: 32 U/L (ref 11–51)

## 2017-03-03 MED ORDER — ONDANSETRON 4 MG PO TBDP: ORAL_TABLET | ORAL | 0 refills | Status: DC

## 2017-03-03 MED ORDER — HYDROMORPHONE HCL 1 MG/ML IJ SOLN
0.5000 mg | Freq: Once | INTRAMUSCULAR | Status: AC
  Administered 2017-03-03: 0.5 mg via INTRAVENOUS
  Filled 2017-03-03: qty 1

## 2017-03-03 MED ORDER — HYDROCODONE-ACETAMINOPHEN 5-325 MG PO TABS: 1.0000 | ORAL_TABLET | Freq: Four times a day (QID) | ORAL | 0 refills | Status: DC | PRN

## 2017-03-03 MED ORDER — ONDANSETRON HCL 4 MG/2ML IJ SOLN
4.0000 mg | Freq: Once | INTRAMUSCULAR | Status: AC
  Administered 2017-03-03: 4 mg via INTRAVENOUS
  Filled 2017-03-03: qty 2

## 2017-03-03 NOTE — Discharge Instructions (Signed)
Follow-up with Dr. Aline Brochure as planned
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
## 2017-03-03 NOTE — ED Triage Notes (Signed)
Patient complaining of left back pain x 1 week.

## 2017-03-10 ENCOUNTER — Ambulatory Visit (INDEPENDENT_AMBULATORY_CARE_PROVIDER_SITE_OTHER): Payer: Medicare Other | Admitting: Family Medicine

## 2017-03-10 ENCOUNTER — Encounter: Payer: Self-pay | Admitting: Family Medicine

## 2017-03-10 ENCOUNTER — Other Ambulatory Visit: Payer: Self-pay

## 2017-03-10 VITALS — BP 132/82 | HR 84 | Temp 98.0°F | Resp 20 | Ht 63.0 in | Wt 155.0 lb

## 2017-03-10 DIAGNOSIS — M546 Pain in thoracic spine: Secondary | ICD-10-CM | POA: Diagnosis not present

## 2017-03-10 MED ORDER — HYDROCODONE-ACETAMINOPHEN 7.5-325 MG PO TABS: 1.0000 | ORAL_TABLET | Freq: Four times a day (QID) | ORAL | 0 refills | Status: DC | PRN

## 2017-03-10 MED ORDER — METHYLPREDNISOLONE ACETATE 80 MG/ML IJ SUSP
40.0000 mg | Freq: Once | INTRAMUSCULAR | Status: AC
  Administered 2017-03-10: 40 mg via INTRAMUSCULAR

## 2017-03-10 NOTE — Patient Instructions (Signed)
Must use ice to area for 24 hrs after the shot After this may use heat Rest Pain medicine as needed Take miralax daily while on pain medicine BE careful of dizziness and drowsiness due to pain meds  See Dr Aline Brochure in follow up

## 2017-03-10 NOTE — Progress Notes (Signed)
Chief Complaint  Patient presents with  . Back Pain    urgent care & ED   Patient is here for an acute problem.  It has been going on for over a week.  She awoke one day with pain in her left upper back.  It is near her shoulder blade.  She has been to the urgent care center.  She is been to the emergency room.  She had an x-ray.  She has had a CAT scan.  No physical abnormality is seen.  She was given a few Vicodin, 5 mg, and these give her a couple hours of relief and then it comes right back.  She has not been sleeping.  It hurts when she moves her neck.  It hurts when she coughs.  She states the pain is a "10".  Nothing she has done has relieved the pain significantly.  She states that she is using a heating pad.  She is cautioned against burning her skin falling asleep on a heating pad.  She tried ice and this did not help. She states she has had this before.  She states that usually a trigger point injection will help her.  She called her orthopedist to get in and cannot be seen until next week.  The patient states that she "cannot wait this long".  Patient Active Problem List   Diagnosis Date Noted  . Mild cognitive impairment, so stated 08/17/2016  . Vitamin D deficiency 07/06/2016  . Atherosclerosis of aorta ([place]) 03/25/2016  . Osteoporosis 03/25/2016  . Pacemaker 10/24/2014  . Diverticulosis of colon without hemorrhage 09/10/2013  . Vitamin B12 deficiency 07/09/2013  . CKD (chronic kidney disease) stage 4, GFR 15-29 ml/min (HCC) 07/05/2013  . Systolic murmur 78/29/5621  . TOBACCO ABUSE 04/16/2009  . HLD (hyperlipidemia) 06/27/2008  . Essential hypertension 06/27/2008  . SICK SINUS/ TACHY-BRADY SYNDROME 06/27/2008  . PPM-St.Jude 06/27/2008    Outpatient Encounter Medications as of 03/10/2017  Medication Sig  . amLODipine (NORVASC) 10 MG tablet Take 0.5 tablets (5 mg total) by mouth daily.  Marland Kitchen doxycycline (VIBRAMYCIN) 100 MG capsule Take 1 capsule (100 mg total) by mouth 2  (two) times daily.  . ondansetron (ZOFRAN ODT) 4 MG disintegrating tablet 4mg  ODT q4 hours prn nausea/vomit  . simvastatin (ZOCOR) 20 MG tablet Take 1 tablet (20 mg total) by mouth at bedtime.  . traMADol (ULTRAM) 50 MG tablet Take 1 tablet (50 mg total) by mouth every 6 (six) hours as needed.  Marland Kitchen HYDROcodone-acetaminophen (NORCO) 7.5-325 MG tablet Take 1 tablet by mouth every 6 (six) hours as needed for moderate pain.   No facility-administered encounter medications on file as of 03/10/2017.     Allergies  Allergen Reactions  . Codeine Nausea And Vomiting    Sick to stomach  . Sulfonamide Derivatives Other (See Comments)    Tongue rash"circles on my tongue"    Review of Systems  Constitutional: Positive for activity change. Negative for appetite change.       Immobilized with pain  HENT: Negative for congestion and dental problem.   Eyes: Negative for photophobia and visual disturbance.  Respiratory: Positive for chest tightness. Negative for choking.        Pain with deep breath  Cardiovascular: Negative for chest pain and palpitations.  Gastrointestinal: Positive for nausea. Negative for diarrhea and vomiting.       Given Zofran in the emergency department  Genitourinary: Negative for difficulty urinating, flank pain and frequency.  Musculoskeletal:  Positive for back pain and myalgias.  Skin: Negative for rash.  Neurological: Negative for weakness and numbness.  Psychiatric/Behavioral: Positive for sleep disturbance.    BP 132/82 (BP Location: Left Arm, Patient Position: Sitting, Cuff Size: Normal)   Pulse 84   Temp 98 F (36.7 C) (Temporal)   Resp 20   Ht 5\' 3"  (1.6 m)   Wt 155 lb (70.3 kg)   SpO2 98%   BMI 27.46 kg/m   Physical Exam  Constitutional: She is oriented to person, place, and time. She appears well-developed and well-nourished. She appears distressed.  Acutely uncomfortable.  Moves frequently.  HENT:  Head: Normocephalic and atraumatic.  Mouth/Throat:  Oropharynx is clear and moist.  Eyes: Conjunctivae are normal. Pupils are equal, round, and reactive to light.  Neck: Normal range of motion. Neck supple.  No tenderness cervical musculature  Cardiovascular: Normal rate, regular rhythm and normal heart sounds.  Pulmonary/Chest: Effort normal and breath sounds normal.      Musculoskeletal: She exhibits tenderness.  Neurological: She is alert and oriented to person, place, and time. She displays abnormal reflex.  Skin: Skin is warm and dry. No rash noted.  Psychiatric:  Very dramatic.  Labile    ASSESSMENT/PLAN:  1. Acute left-sided thoracic back pain Timeout. Consent signed. A trigger point injection was performed at the site of maximal tenderness using 1% plain Lidocaine and *40 mg of Depo-Medrol*. This was well tolerated, and followed by mild relief of pain.  Postinjection care was discussed  - methylPREDNISolone acetate (DEPO-MEDROL) injection 40 mg   Patient Instructions  Must use ice to area for 24 hrs after the shot After this may use heat Rest Pain medicine as needed Take miralax daily while on pain medicine BE careful of dizziness and drowsiness due to pain meds  See Dr Aline Brochure in follow up   Raylene Everts, MD

## 2017-03-15 ENCOUNTER — Ambulatory Visit (INDEPENDENT_AMBULATORY_CARE_PROVIDER_SITE_OTHER): Payer: Medicare Other

## 2017-03-15 ENCOUNTER — Encounter: Payer: Self-pay | Admitting: Orthopedic Surgery

## 2017-03-15 ENCOUNTER — Ambulatory Visit (INDEPENDENT_AMBULATORY_CARE_PROVIDER_SITE_OTHER): Payer: Medicare Other | Admitting: Orthopedic Surgery

## 2017-03-15 VITALS — BP 154/83 | HR 66 | Ht 63.0 in | Wt 150.0 lb

## 2017-03-15 DIAGNOSIS — G8929 Other chronic pain: Secondary | ICD-10-CM | POA: Diagnosis not present

## 2017-03-15 DIAGNOSIS — M546 Pain in thoracic spine: Secondary | ICD-10-CM

## 2017-03-15 DIAGNOSIS — M5440 Lumbago with sciatica, unspecified side: Secondary | ICD-10-CM

## 2017-03-15 NOTE — Patient Instructions (Signed)
Activities as tolerated. 
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
## 2017-03-28 ENCOUNTER — Encounter: Payer: Medicare Other | Admitting: Internal Medicine

## 2017-03-31 ENCOUNTER — Encounter: Payer: Medicare Other | Admitting: Internal Medicine

## 2017-04-06 ENCOUNTER — Telehealth: Payer: Self-pay | Admitting: Cardiology

## 2017-04-06 ENCOUNTER — Ambulatory Visit: Payer: Medicare Other | Admitting: *Deleted

## 2017-04-06 NOTE — Telephone Encounter (Signed)
Spoke with pt and reminded pt of remote transmission that is due today. Pt verbalized understanding.   

## 2017-04-07 ENCOUNTER — Telehealth: Payer: Self-pay | Admitting: *Deleted

## 2017-04-07 NOTE — Telephone Encounter (Signed)
Per Karilyn Cota, [employer] rep, patient needs Device Clinic appointment to reset Merlin monitor.  Patient will need to bring monitor to this appointment.  Attempted call--no answer, no VM set up.

## 2017-04-08 ENCOUNTER — Encounter: Payer: Self-pay | Admitting: Cardiology

## 2017-04-08 ENCOUNTER — Ambulatory Visit (INDEPENDENT_AMBULATORY_CARE_PROVIDER_SITE_OTHER): Payer: Medicare Other | Admitting: *Deleted

## 2017-04-08 DIAGNOSIS — I495 Sick sinus syndrome: Secondary | ICD-10-CM

## 2017-04-11 ENCOUNTER — Encounter: Payer: Self-pay | Admitting: Cardiology

## 2017-04-11 NOTE — Progress Notes (Signed)
Remote pacemaker transmission.   

## 2017-04-13 ENCOUNTER — Encounter: Payer: Self-pay | Admitting: Internal Medicine

## 2017-04-14 ENCOUNTER — Telehealth: Payer: Self-pay | Admitting: *Deleted

## 2017-04-14 ENCOUNTER — Other Ambulatory Visit: Payer: Self-pay | Admitting: Family Medicine

## 2017-04-14 NOTE — Telephone Encounter (Signed)
See phone note from 04/14/17.  

## 2017-04-14 NOTE — Telephone Encounter (Signed)
°  1. Has your device fired? no ° °2. Is you device beeping? no ° °3. Are you experiencing draining or swelling at device site?no ° °4. Are you calling to see if we received your device transmission?yes ° °5. Have you passed out? no ° ° ° °Please route to Device Clinic Pool °

## 2017-04-14 NOTE — Telephone Encounter (Signed)
Spoke with patient.  Advised she can disregard delinquent transmission letter.  Rescheduled appointment with Dr. Lovena Le for 06/23/17 at 2pm as appointments on 6/18 and 6/19 were cancelled due to schedule changes.  Patient is aware that she will receive a new Merlin monitor at her upcoming appointment due to RF issues with her original monitor.  Explained that the inductive monitor is not a long-term solution since her PPM should automatically transmit.  Patient is appreciative of call and denies questions or concerns at this time.

## 2017-04-25 DIAGNOSIS — E785 Hyperlipidemia, unspecified: Secondary | ICD-10-CM | POA: Diagnosis not present

## 2017-04-25 DIAGNOSIS — I129 Hypertensive chronic kidney disease with stage 1 through stage 4 chronic kidney disease, or unspecified chronic kidney disease: Secondary | ICD-10-CM | POA: Diagnosis not present

## 2017-04-25 DIAGNOSIS — Z720 Tobacco use: Secondary | ICD-10-CM | POA: Diagnosis not present

## 2017-04-25 DIAGNOSIS — N183 Chronic kidney disease, stage 3 (moderate): Secondary | ICD-10-CM | POA: Diagnosis not present

## 2017-04-25 DIAGNOSIS — N2581 Secondary hyperparathyroidism of renal origin: Secondary | ICD-10-CM | POA: Diagnosis not present

## 2017-04-26 LAB — CUP PACEART REMOTE DEVICE CHECK
Battery Remaining Longevity: 116 mo
Battery Remaining Percentage: 95.5 %
Battery Voltage: 3.01 V
Brady Statistic RA Percent Paced: 58 %
Date Time Interrogation Session: 20190329223103
Implantable Lead Implant Date: 20161013
Implantable Lead Location: 753860
Lead Channel Impedance Value: 400 Ohm
Lead Channel Pacing Threshold Amplitude: 0.75 V
Lead Channel Pacing Threshold Pulse Width: 0.5 ms
Lead Channel Sensing Intrinsic Amplitude: 1.4 mV
MDC IDC LEAD IMPLANT DT: 20161013
MDC IDC LEAD LOCATION: 753859
MDC IDC MSMT LEADCHNL RV IMPEDANCE VALUE: 490 Ohm
MDC IDC MSMT LEADCHNL RV PACING THRESHOLD AMPLITUDE: 0.75 V
MDC IDC MSMT LEADCHNL RV PACING THRESHOLD PULSEWIDTH: 0.5 ms
MDC IDC MSMT LEADCHNL RV SENSING INTR AMPL: 12 mV
MDC IDC PG IMPLANT DT: 20161013
MDC IDC SET LEADCHNL RA PACING AMPLITUDE: 2 V
MDC IDC SET LEADCHNL RV PACING AMPLITUDE: 2.5 V
MDC IDC SET LEADCHNL RV PACING PULSEWIDTH: 0.5 ms
MDC IDC SET LEADCHNL RV SENSING SENSITIVITY: 2 mV
MDC IDC STAT BRADY AP VP PERCENT: 1 %
MDC IDC STAT BRADY AP VS PERCENT: 58 %
MDC IDC STAT BRADY AS VP PERCENT: 1 %
MDC IDC STAT BRADY AS VS PERCENT: 41 %
MDC IDC STAT BRADY RV PERCENT PACED: 1 %
Pulse Gen Model: 2240
Pulse Gen Serial Number: 7820884

## 2017-06-14 ENCOUNTER — Telehealth: Payer: Self-pay | Admitting: Family Medicine

## 2017-06-14 ENCOUNTER — Encounter: Payer: Self-pay | Admitting: Family Medicine

## 2017-06-14 NOTE — Telephone Encounter (Signed)
lvm to reschedule 06/28/17 appt, Dr.Hagler will not be in the office

## 2017-06-16 ENCOUNTER — Encounter: Payer: Self-pay | Admitting: Family Medicine

## 2017-06-17 ENCOUNTER — Encounter: Payer: Self-pay | Admitting: Family Medicine

## 2017-06-23 ENCOUNTER — Encounter: Payer: Self-pay | Admitting: Internal Medicine

## 2017-06-23 ENCOUNTER — Ambulatory Visit (INDEPENDENT_AMBULATORY_CARE_PROVIDER_SITE_OTHER): Payer: PPO | Admitting: Internal Medicine

## 2017-06-23 VITALS — BP 124/72 | HR 68 | Ht 63.0 in | Wt 154.0 lb

## 2017-06-23 DIAGNOSIS — I495 Sick sinus syndrome: Secondary | ICD-10-CM

## 2017-06-23 DIAGNOSIS — I10 Essential (primary) hypertension: Secondary | ICD-10-CM | POA: Diagnosis not present

## 2017-06-23 DIAGNOSIS — Z950 Presence of cardiac pacemaker: Secondary | ICD-10-CM
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
## 2017-06-23 NOTE — Patient Instructions (Signed)
Medication Instructions:  Your physician recommends that you continue on your current medications as directed. Please refer to the Current Medication list given to you today.  Labwork: None ordered.  Testing/Procedures: None ordered.  Follow-Up: Your physician wants you to follow-up in: one year with Dr. Lovena Le.  You will receive a reminder letter in the mail two months in advance. If you don't receive a letter, please call our office to schedule the follow-up appointment.  Remote monitoring is used to monitor your Pacemaker from home. This monitoring reduces the number of office visits required to check your device to one time per year. It allows [place] to keep an eye on the functioning of your device to ensure it is working properly. You are scheduled for a device check from home on 07/11/2017. You may send your transmission at any time that day. If you have a wireless device, the transmission will be sent automatically. After your physician reviews your transmission, you will receive a postcard with your next transmission date.  Any Other Special Instructions Will Be Listed Below (If Applicable).  If you need a refill on your cardiac medications before your next appointment, please call your pharmacy.

## 2017-06-24 ENCOUNTER — Telehealth: Payer: Self-pay | Admitting: Physician Assistant

## 2017-06-24 NOTE — Telephone Encounter (Signed)
Patient called saying she forgot to go by the billing office on 6/13 and wanted to know what she owed.  I left a message saying that I did not have access to that information, call back on Monday.  Rosaria Ferries, PA-C 06/24/2017 1:48 PM [facility] [phone]

## 2017-06-27 LAB — CUP PACEART INCLINIC DEVICE CHECK
Battery Voltage: 3.01 V
Brady Statistic RA Percent Paced: 59 %
Date Time Interrogation Session: 20190613180545
Implantable Lead Implant Date: 20161013
Implantable Lead Location: 753860
Lead Channel Pacing Threshold Amplitude: 0.75 V
Lead Channel Pacing Threshold Amplitude: 0.75 V
Lead Channel Pacing Threshold Pulse Width: 0.5 ms
Lead Channel Pacing Threshold Pulse Width: 0.5 ms
Lead Channel Pacing Threshold Pulse Width: 0.5 ms
Lead Channel Sensing Intrinsic Amplitude: 1.5 mV
Lead Channel Sensing Intrinsic Amplitude: 12 mV
Lead Channel Setting Pacing Amplitude: 2 V
Lead Channel Setting Pacing Pulse Width: 0.5 ms
Lead Channel Setting Sensing Sensitivity: 2 mV
MDC IDC LEAD IMPLANT DT: 20161013
MDC IDC LEAD LOCATION: 753859
MDC IDC MSMT BATTERY REMAINING LONGEVITY: 126 mo
MDC IDC MSMT LEADCHNL RA IMPEDANCE VALUE: 375 Ohm
MDC IDC MSMT LEADCHNL RA PACING THRESHOLD AMPLITUDE: 0.75 V
MDC IDC MSMT LEADCHNL RV IMPEDANCE VALUE: 512.5 Ohm
MDC IDC MSMT LEADCHNL RV PACING THRESHOLD AMPLITUDE: 0.75 V
MDC IDC MSMT LEADCHNL RV PACING THRESHOLD PULSEWIDTH: 0.5 ms
MDC IDC PG IMPLANT DT: 20161013
MDC IDC SET LEADCHNL RV PACING AMPLITUDE: 2.5 V
MDC IDC STAT BRADY RV PERCENT PACED: 0.89 %
Pulse Gen Model: 2240
Pulse Gen Serial Number: 7820884

## 2017-06-28 ENCOUNTER — Encounter: Payer: Medicare Other | Admitting: Internal Medicine

## 2017-06-28 ENCOUNTER — Encounter: Payer: Medicare Other | Admitting: Family Medicine

## 2017-06-29 ENCOUNTER — Encounter: Payer: Medicare Other | Admitting: Internal Medicine

## 2017-07-11 ENCOUNTER — Ambulatory Visit (INDEPENDENT_AMBULATORY_CARE_PROVIDER_SITE_OTHER): Payer: PPO | Admitting: *Deleted

## 2017-07-11 DIAGNOSIS — R001 Bradycardia, unspecified: Secondary | ICD-10-CM | POA: Diagnosis not present

## 2017-07-11 NOTE — Progress Notes (Signed)
Remote pacemaker transmission.   

## 2017-07-13 DIAGNOSIS — E782 Mixed hyperlipidemia: Secondary | ICD-10-CM | POA: Diagnosis not present

## 2017-07-13 DIAGNOSIS — M1990 Unspecified osteoarthritis, unspecified site: Secondary | ICD-10-CM | POA: Diagnosis not present

## 2017-07-13 DIAGNOSIS — I10 Essential (primary) hypertension: Secondary | ICD-10-CM | POA: Diagnosis not present

## 2017-07-13 DIAGNOSIS — Z6827 Body mass index (BMI) 27.0-27.9, adult: Secondary | ICD-10-CM | POA: Diagnosis not present

## 2017-07-13 DIAGNOSIS — F17200 Nicotine dependence, unspecified, uncomplicated: Secondary | ICD-10-CM | POA: Diagnosis not present

## 2017-07-19 ENCOUNTER — Encounter: Payer: Medicare Other | Admitting: Family Medicine

## 2017-07-26 LAB — CUP PACEART REMOTE DEVICE CHECK
Battery Remaining Longevity: 115 mo
Battery Remaining Percentage: 95.5 %
Brady Statistic AP VP Percent: 1 %
Brady Statistic AP VS Percent: 64 %
Brady Statistic AS VP Percent: 1 %
Brady Statistic AS VS Percent: 35 %
Brady Statistic RA Percent Paced: 64 %
Date Time Interrogation Session: 20190701060017
Implantable Lead Implant Date: 20161013
Implantable Lead Implant Date: 20161013
Implantable Lead Location: 753859
Implantable Lead Location: 753860
Lead Channel Impedance Value: 400 Ohm
Lead Channel Pacing Threshold Pulse Width: 0.5 ms
Lead Channel Pacing Threshold Pulse Width: 0.5 ms
Lead Channel Sensing Intrinsic Amplitude: 12 mV
Lead Channel Sensing Intrinsic Amplitude: 2.2 mV
Lead Channel Setting Pacing Amplitude: 2 V
Lead Channel Setting Pacing Amplitude: 2.5 V
MDC IDC MSMT BATTERY VOLTAGE: 3.01 V
MDC IDC MSMT LEADCHNL RA PACING THRESHOLD AMPLITUDE: 0.75 V
MDC IDC MSMT LEADCHNL RV IMPEDANCE VALUE: 450 Ohm
MDC IDC MSMT LEADCHNL RV PACING THRESHOLD AMPLITUDE: 0.75 V
MDC IDC PG IMPLANT DT: 20161013
MDC IDC PG SERIAL: 7820884
MDC IDC SET LEADCHNL RV PACING PULSEWIDTH: 0.5 ms
MDC IDC SET LEADCHNL RV SENSING SENSITIVITY: 2 mV
MDC IDC STAT BRADY RV PERCENT PACED: 1 %

## 2017-08-03 DIAGNOSIS — I10 Essential (primary) hypertension: Secondary | ICD-10-CM | POA: Diagnosis not present

## 2017-08-03 DIAGNOSIS — E785 Hyperlipidemia, unspecified: Secondary | ICD-10-CM | POA: Diagnosis not present

## 2017-08-03 DIAGNOSIS — F17200 Nicotine dependence, unspecified, uncomplicated: Secondary | ICD-10-CM | POA: Diagnosis not present

## 2017-08-10 DIAGNOSIS — Z712 Person consulting for explanation of examination or test findings: Secondary | ICD-10-CM | POA: Diagnosis not present

## 2017-08-10 DIAGNOSIS — E782 Mixed hyperlipidemia: Secondary | ICD-10-CM | POA: Diagnosis not present

## 2017-08-10 DIAGNOSIS — Z0001 Encounter for general adult medical examination with abnormal findings: Secondary | ICD-10-CM | POA: Diagnosis not present

## 2017-08-10 DIAGNOSIS — Z6827 Body mass index (BMI) 27.0-27.9, adult: Secondary | ICD-10-CM | POA: Diagnosis not present

## 2017-08-10 DIAGNOSIS — M1990 Unspecified osteoarthritis, unspecified site: Secondary | ICD-10-CM | POA: Diagnosis not present

## 2017-08-10 DIAGNOSIS — N184 Chronic kidney disease, stage 4 (severe): Secondary | ICD-10-CM | POA: Diagnosis not present

## 2017-08-10 DIAGNOSIS — F17200 Nicotine dependence, unspecified, uncomplicated: Secondary | ICD-10-CM | POA: Diagnosis not present

## 2017-08-10 DIAGNOSIS — I10 Essential (primary) hypertension: Secondary | ICD-10-CM | POA: Diagnosis not present

## 2017-08-10 DIAGNOSIS — R011 Cardiac murmur, unspecified: Secondary | ICD-10-CM | POA: Diagnosis not present

## 2017-08-10 DIAGNOSIS — Z950 Presence of cardiac pacemaker: Secondary | ICD-10-CM | POA: Diagnosis not present

## 2017-09-21 DIAGNOSIS — I10 Essential (primary) hypertension: Secondary | ICD-10-CM | POA: Diagnosis not present

## 2017-10-10 ENCOUNTER — Ambulatory Visit (INDEPENDENT_AMBULATORY_CARE_PROVIDER_SITE_OTHER): Payer: PPO | Admitting: *Deleted

## 2017-10-10 DIAGNOSIS — R001 Bradycardia, unspecified: Secondary | ICD-10-CM

## 2017-10-10 NOTE — Progress Notes (Signed)
Remote pacemaker transmission.   

## 2017-11-03 LAB — CUP PACEART REMOTE DEVICE CHECK
Battery Remaining Longevity: 113 mo
Brady Statistic AP VS Percent: 68 %
Brady Statistic AS VP Percent: 1 %
Implantable Lead Implant Date: 20161013
Implantable Lead Implant Date: 20161013
Implantable Lead Location: 753859
Lead Channel Impedance Value: 400 Ohm
Lead Channel Pacing Threshold Amplitude: 0.75 V
Lead Channel Sensing Intrinsic Amplitude: 12 mV
Lead Channel Setting Pacing Amplitude: 2 V
Lead Channel Setting Pacing Pulse Width: 0.5 ms
MDC IDC LEAD LOCATION: 753860
MDC IDC MSMT BATTERY REMAINING PERCENTAGE: 95.5 %
MDC IDC MSMT BATTERY VOLTAGE: 3.01 V
MDC IDC MSMT LEADCHNL RA PACING THRESHOLD AMPLITUDE: 0.75 V
MDC IDC MSMT LEADCHNL RA PACING THRESHOLD PULSEWIDTH: 0.5 ms
MDC IDC MSMT LEADCHNL RA SENSING INTR AMPL: 2.1 mV
MDC IDC MSMT LEADCHNL RV IMPEDANCE VALUE: 460 Ohm
MDC IDC MSMT LEADCHNL RV PACING THRESHOLD PULSEWIDTH: 0.5 ms
MDC IDC PG IMPLANT DT: 20161013
MDC IDC SESS DTM: 20190930060013
MDC IDC SET LEADCHNL RV PACING AMPLITUDE: 2.5 V
MDC IDC SET LEADCHNL RV SENSING SENSITIVITY: 2 mV
MDC IDC STAT BRADY AP VP PERCENT: 3.5 %
MDC IDC STAT BRADY AS VS PERCENT: 29 %
MDC IDC STAT BRADY RA PERCENT PACED: 71 %
MDC IDC STAT BRADY RV PERCENT PACED: 3.6 %
Pulse Gen Serial Number: 7820884

## 2017-12-07 DIAGNOSIS — J069 Acute upper respiratory infection, unspecified: Secondary | ICD-10-CM | POA: Diagnosis not present

## 2017-12-07 DIAGNOSIS — N184 Chronic kidney disease, stage 4 (severe): Secondary | ICD-10-CM | POA: Diagnosis not present

## 2017-12-07 DIAGNOSIS — F17218 Nicotine dependence, cigarettes, with other nicotine-induced disorders: Secondary | ICD-10-CM | POA: Diagnosis not present

## 2017-12-07 DIAGNOSIS — Z23 Encounter for immunization: Secondary | ICD-10-CM | POA: Diagnosis not present

## 2017-12-07 DIAGNOSIS — Z2821 Immunization not carried out because of patient refusal: Secondary | ICD-10-CM | POA: Diagnosis not present

## 2017-12-07 DIAGNOSIS — R011 Cardiac murmur, unspecified: Secondary | ICD-10-CM | POA: Diagnosis not present

## 2017-12-07 DIAGNOSIS — Z6826 Body mass index (BMI) 26.0-26.9, adult: Secondary | ICD-10-CM | POA: Diagnosis not present

## 2017-12-07 DIAGNOSIS — E663 Overweight: Secondary | ICD-10-CM | POA: Diagnosis not present

## 2017-12-07 DIAGNOSIS — I10 Essential (primary) hypertension: Secondary | ICD-10-CM | POA: Diagnosis not present

## 2017-12-26 DIAGNOSIS — H2511 Age-related nuclear cataract, right eye: Secondary | ICD-10-CM | POA: Diagnosis not present

## 2017-12-26 DIAGNOSIS — Z961 Presence of intraocular lens: Secondary | ICD-10-CM | POA: Diagnosis not present

## 2017-12-26 DIAGNOSIS — D3131 Benign neoplasm of right choroid: Secondary | ICD-10-CM | POA: Diagnosis not present

## 2017-12-26 DIAGNOSIS — Z9842 Cataract extraction status, left eye: Secondary | ICD-10-CM | POA: Diagnosis not present

## 2018-01-09 ENCOUNTER — Ambulatory Visit (INDEPENDENT_AMBULATORY_CARE_PROVIDER_SITE_OTHER): Payer: PPO

## 2018-01-09 DIAGNOSIS — R001 Bradycardia, unspecified: Secondary | ICD-10-CM

## 2018-01-09 NOTE — Progress Notes (Signed)
Remote pacemaker transmission.   

## 2018-01-10 LAB — CUP PACEART REMOTE DEVICE CHECK
Battery Remaining Longevity: 112 mo
Battery Remaining Percentage: 95.5 %
Brady Statistic AP VS Percent: 69 %
Brady Statistic RA Percent Paced: 73 %
Brady Statistic RV Percent Paced: 4.6 %
Date Time Interrogation Session: 20191230070023
Implantable Lead Implant Date: 20161013
Implantable Lead Location: 753859
Implantable Pulse Generator Implant Date: 20161013
Lead Channel Pacing Threshold Amplitude: 0.75 V
Lead Channel Pacing Threshold Pulse Width: 0.5 ms
Lead Channel Sensing Intrinsic Amplitude: 1.9 mV
Lead Channel Sensing Intrinsic Amplitude: 12 mV
Lead Channel Setting Pacing Amplitude: 2 V
Lead Channel Setting Pacing Amplitude: 2.5 V
Lead Channel Setting Pacing Pulse Width: 0.5 ms
Lead Channel Setting Sensing Sensitivity: 2 mV
MDC IDC LEAD IMPLANT DT: 20161013
MDC IDC LEAD LOCATION: 753860
MDC IDC MSMT BATTERY VOLTAGE: 3.01 V
MDC IDC MSMT LEADCHNL RA IMPEDANCE VALUE: 380 Ohm
MDC IDC MSMT LEADCHNL RA PACING THRESHOLD AMPLITUDE: 0.75 V
MDC IDC MSMT LEADCHNL RV IMPEDANCE VALUE: 460 Ohm
MDC IDC MSMT LEADCHNL RV PACING THRESHOLD PULSEWIDTH: 0.5 ms
MDC IDC STAT BRADY AP VP PERCENT: 4.5 %
MDC IDC STAT BRADY AS VP PERCENT: 1 %
MDC IDC STAT BRADY AS VS PERCENT: 26 %
Pulse Gen Model: 2240
Pulse Gen Serial Number: 7820884

## 2018-02-15 ENCOUNTER — Other Ambulatory Visit (HOSPITAL_COMMUNITY): Payer: Self-pay | Admitting: Respiratory Therapy

## 2018-02-15 DIAGNOSIS — R05 Cough: Secondary | ICD-10-CM | POA: Diagnosis not present

## 2018-02-15 DIAGNOSIS — E782 Mixed hyperlipidemia: Secondary | ICD-10-CM | POA: Diagnosis not present

## 2018-02-15 DIAGNOSIS — R011 Cardiac murmur, unspecified: Secondary | ICD-10-CM | POA: Diagnosis not present

## 2018-02-15 DIAGNOSIS — F17210 Nicotine dependence, cigarettes, uncomplicated: Secondary | ICD-10-CM | POA: Diagnosis not present

## 2018-02-15 DIAGNOSIS — Z950 Presence of cardiac pacemaker: Secondary | ICD-10-CM | POA: Diagnosis not present

## 2018-02-15 DIAGNOSIS — M545 Low back pain: Secondary | ICD-10-CM | POA: Diagnosis not present

## 2018-02-15 DIAGNOSIS — H6122 Impacted cerumen, left ear: Secondary | ICD-10-CM | POA: Diagnosis not present

## 2018-02-15 DIAGNOSIS — I10 Essential (primary) hypertension: Secondary | ICD-10-CM | POA: Diagnosis not present

## 2018-02-15 DIAGNOSIS — R059 Cough, unspecified: Secondary | ICD-10-CM

## 2018-02-15 DIAGNOSIS — Z2821 Immunization not carried out because of patient refusal: Secondary | ICD-10-CM | POA: Diagnosis not present

## 2018-02-21 DIAGNOSIS — E782 Mixed hyperlipidemia: Secondary | ICD-10-CM | POA: Diagnosis not present

## 2018-02-21 DIAGNOSIS — F17200 Nicotine dependence, unspecified, uncomplicated: Secondary | ICD-10-CM | POA: Diagnosis not present

## 2018-02-21 DIAGNOSIS — Z950 Presence of cardiac pacemaker: Secondary | ICD-10-CM | POA: Diagnosis not present

## 2018-02-21 DIAGNOSIS — I129 Hypertensive chronic kidney disease with stage 1 through stage 4 chronic kidney disease, or unspecified chronic kidney disease: Secondary | ICD-10-CM | POA: Diagnosis not present

## 2018-02-21 DIAGNOSIS — M1990 Unspecified osteoarthritis, unspecified site: Secondary | ICD-10-CM | POA: Diagnosis not present

## 2018-02-21 DIAGNOSIS — N184 Chronic kidney disease, stage 4 (severe): Secondary | ICD-10-CM | POA: Diagnosis not present

## 2018-02-21 DIAGNOSIS — G3184 Mild cognitive impairment, so stated: Secondary | ICD-10-CM | POA: Diagnosis not present

## 2018-02-21 DIAGNOSIS — N3281 Overactive bladder: Secondary | ICD-10-CM | POA: Diagnosis not present

## 2018-02-21 DIAGNOSIS — R011 Cardiac murmur, unspecified: Secondary | ICD-10-CM | POA: Diagnosis not present

## 2018-03-08 ENCOUNTER — Ambulatory Visit (HOSPITAL_COMMUNITY)
Admission: RE | Admit: 2018-03-08 | Discharge: 2018-03-08 | Disposition: A | Discharge status: Home / Self Care | Payer: PPO | Source: Ambulatory Visit | Attending: Internal Medicine | Admitting: Internal Medicine

## 2018-03-08 DIAGNOSIS — R05 Cough: Secondary | ICD-10-CM | POA: Insufficient documentation

## 2018-03-08 DIAGNOSIS — R059 Cough, unspecified: Secondary | ICD-10-CM

## 2018-03-08 LAB — PULMONARY FUNCTION TEST
DL/VA % pred: 84 %
DL/VA: 3.43 ml/min/mmHg/L
DLCO UNC % PRED: 71 %
DLCO unc: 12.89 ml/min/mmHg
FEF 25-75 PRE: 1.11 L/s
FEF 25-75 Post: 1.71 L/sec
FEF2575-%Change-Post: 53 %
FEF2575-%Pred-Post: 149 %
FEF2575-%Pred-Pre: 96 %
FEV1-%Change-Post: 12 %
FEV1-%Pred-Post: 96 %
FEV1-%Pred-Pre: 85 %
FEV1-Post: 1.65 L
FEV1-Pre: 1.46 L
FEV1FVC-%Change-Post: -3 %
FEV1FVC-%Pred-Pre: 102 %
FEV6-%CHANGE-POST: 14 %
FEV6-%PRED-POST: 102 %
FEV6-%PRED-PRE: 90 %
FEV6-PRE: 1.95 L
FEV6-Post: 2.23 L
FEV6FVC-%CHANGE-POST: -2 %
FEV6FVC-%PRED-PRE: 106 %
FEV6FVC-%Pred-Post: 104 %
FVC-%CHANGE-POST: 16 %
FVC-%PRED-POST: 98 %
FVC-%Pred-Pre: 84 %
FVC-Post: 2.29 L
FVC-Pre: 1.95 L
POST FEV1/FVC RATIO: 72 %
POST FEV6/FVC RATIO: 98 %
Pre FEV1/FVC ratio: 75 %
Pre FEV6/FVC Ratio: 100 %

## 2018-03-08 MED ORDER — ALBUTEROL SULFATE (2.5 MG/3ML) 0.083% IN NEBU
2.5000 mg | INHALATION_SOLUTION | Freq: Once | RESPIRATORY_TRACT | Status: AC
  Administered 2018-03-08: 2.5 mg via RESPIRATORY_TRACT

## 2018-04-10 ENCOUNTER — Ambulatory Visit (INDEPENDENT_AMBULATORY_CARE_PROVIDER_SITE_OTHER): Payer: PPO | Admitting: *Deleted

## 2018-04-10 ENCOUNTER — Other Ambulatory Visit: Payer: Self-pay

## 2018-04-10 DIAGNOSIS — R001 Bradycardia, unspecified: Secondary | ICD-10-CM | POA: Diagnosis not present

## 2018-04-10 DIAGNOSIS — I495 Sick sinus syndrome: Secondary | ICD-10-CM

## 2018-04-10 LAB — CUP PACEART REMOTE DEVICE CHECK
Battery Remaining Longevity: 112 mo
Brady Statistic AP VP Percent: 4.9 %
Brady Statistic AP VS Percent: 69 %
Brady Statistic AS VP Percent: 1 %
Brady Statistic RV Percent Paced: 5 %
Implantable Lead Implant Date: 20161013
Implantable Lead Location: 753859
Lead Channel Impedance Value: 400 Ohm
Lead Channel Impedance Value: 430 Ohm
Lead Channel Pacing Threshold Amplitude: 0.75 V
Lead Channel Sensing Intrinsic Amplitude: 12 mV
Lead Channel Setting Pacing Amplitude: 2 V
Lead Channel Setting Pacing Amplitude: 2.5 V
Lead Channel Setting Pacing Pulse Width: 0.5 ms
MDC IDC LEAD IMPLANT DT: 20161013
MDC IDC LEAD LOCATION: 753860
MDC IDC MSMT BATTERY REMAINING PERCENTAGE: 95.5 %
MDC IDC MSMT BATTERY VOLTAGE: 3.01 V
MDC IDC MSMT LEADCHNL RA PACING THRESHOLD AMPLITUDE: 0.75 V
MDC IDC MSMT LEADCHNL RA PACING THRESHOLD PULSEWIDTH: 0.5 ms
MDC IDC MSMT LEADCHNL RA SENSING INTR AMPL: 2.1 mV
MDC IDC MSMT LEADCHNL RV PACING THRESHOLD PULSEWIDTH: 0.5 ms
MDC IDC PG IMPLANT DT: 20161013
MDC IDC PG SERIAL: 7820884
MDC IDC SESS DTM: 20200330060024
MDC IDC SET LEADCHNL RV SENSING SENSITIVITY: 2 mV
MDC IDC STAT BRADY AS VS PERCENT: 26 %
MDC IDC STAT BRADY RA PERCENT PACED: 73 %

## 2018-04-17 NOTE — Progress Notes (Signed)
Remote pacemaker transmission.   

## 2018-05-04 DIAGNOSIS — H00025 Hordeolum internum left lower eyelid: Secondary | ICD-10-CM | POA: Diagnosis not present

## 2018-05-24 DIAGNOSIS — Z0000 Encounter for general adult medical examination without abnormal findings: Secondary | ICD-10-CM | POA: Diagnosis not present

## 2018-05-30 DIAGNOSIS — N184 Chronic kidney disease, stage 4 (severe): Secondary | ICD-10-CM | POA: Diagnosis not present

## 2018-05-30 DIAGNOSIS — N3281 Overactive bladder: Secondary | ICD-10-CM | POA: Diagnosis not present

## 2018-05-30 DIAGNOSIS — F17210 Nicotine dependence, cigarettes, uncomplicated: Secondary | ICD-10-CM | POA: Diagnosis not present

## 2018-05-30 DIAGNOSIS — G3184 Mild cognitive impairment, so stated: Secondary | ICD-10-CM | POA: Diagnosis not present

## 2018-05-30 DIAGNOSIS — E782 Mixed hyperlipidemia: Secondary | ICD-10-CM | POA: Diagnosis not present

## 2018-05-30 DIAGNOSIS — I129 Hypertensive chronic kidney disease with stage 1 through stage 4 chronic kidney disease, or unspecified chronic kidney disease: Secondary | ICD-10-CM | POA: Diagnosis not present

## 2018-06-03 ENCOUNTER — Emergency Department (HOSPITAL_COMMUNITY): Payer: PPO

## 2018-06-03 ENCOUNTER — Emergency Department (HOSPITAL_COMMUNITY)
Admission: EM | Admit: 2018-06-03 | Discharge: 2018-06-03 | Disposition: A | Discharge status: Home / Self Care | Payer: PPO | Attending: Emergency Medicine | Admitting: Emergency Medicine

## 2018-06-03 ENCOUNTER — Encounter (HOSPITAL_COMMUNITY): Payer: Self-pay | Admitting: Emergency Medicine

## 2018-06-03 ENCOUNTER — Other Ambulatory Visit: Payer: Self-pay

## 2018-06-03 DIAGNOSIS — N184 Chronic kidney disease, stage 4 (severe): Secondary | ICD-10-CM | POA: Diagnosis not present

## 2018-06-03 DIAGNOSIS — Z1159 Encounter for screening for other viral diseases: Secondary | ICD-10-CM | POA: Diagnosis not present

## 2018-06-03 DIAGNOSIS — R0602 Shortness of breath: Secondary | ICD-10-CM | POA: Diagnosis not present

## 2018-06-03 DIAGNOSIS — K5792 Diverticulitis of intestine, part unspecified, without perforation or abscess without bleeding: Secondary | ICD-10-CM | POA: Diagnosis not present

## 2018-06-03 DIAGNOSIS — F17210 Nicotine dependence, cigarettes, uncomplicated: Secondary | ICD-10-CM | POA: Insufficient documentation

## 2018-06-03 DIAGNOSIS — K5790 Diverticulosis of intestine, part unspecified, without perforation or abscess without bleeding: Secondary | ICD-10-CM | POA: Diagnosis not present

## 2018-06-03 DIAGNOSIS — I129 Hypertensive chronic kidney disease with stage 1 through stage 4 chronic kidney disease, or unspecified chronic kidney disease: Secondary | ICD-10-CM | POA: Insufficient documentation

## 2018-06-03 DIAGNOSIS — R1084 Generalized abdominal pain: Secondary | ICD-10-CM | POA: Diagnosis present

## 2018-06-03 DIAGNOSIS — Z20828 Contact with and (suspected) exposure to other viral communicable diseases: Secondary | ICD-10-CM | POA: Diagnosis not present

## 2018-06-03 DIAGNOSIS — Z79899 Other long term (current) drug therapy: Secondary | ICD-10-CM | POA: Insufficient documentation

## 2018-06-03 LAB — URINALYSIS, ROUTINE W REFLEX MICROSCOPIC
Bilirubin Urine: NEGATIVE
Glucose, UA: NEGATIVE mg/dL
Ketones, ur: NEGATIVE mg/dL
Nitrite: NEGATIVE
Protein, ur: 100 mg/dL — AB
Specific Gravity, Urine: 1.012 (ref 1.005–1.030)
pH: 6 (ref 5.0–8.0)

## 2018-06-03 LAB — COMPREHENSIVE METABOLIC PANEL
ALT: 12 U/L (ref 0–44)
AST: 14 U/L — ABNORMAL LOW (ref 15–41)
Albumin: 3.5 g/dL (ref 3.5–5.0)
Alkaline Phosphatase: 65 U/L (ref 38–126)
Anion gap: 10 (ref 5–15)
BUN: 24 mg/dL — ABNORMAL HIGH (ref 8–23)
CO2: 22 mmol/L (ref 22–32)
Calcium: 8.9 mg/dL (ref 8.9–10.3)
Chloride: 110 mmol/L (ref 98–111)
Creatinine, Ser: 1.86 mg/dL — ABNORMAL HIGH (ref 0.44–1.00)
GFR calc Af Amer: 28 mL/min — ABNORMAL LOW (ref >60)
GFR calc non Af Amer: 24 mL/min — ABNORMAL LOW (ref >60)
Glucose, Bld: 112 mg/dL — ABNORMAL HIGH (ref 70–99)
Potassium: 4 mmol/L (ref 3.5–5.1)
Sodium: 142 mmol/L (ref 135–145)
Total Bilirubin: 0.7 mg/dL (ref 0.3–1.2)
Total Protein: 6.6 g/dL (ref 6.5–8.1)

## 2018-06-03 LAB — CBC
HCT: 39.9 % (ref 36.0–46.0)
Hemoglobin: 13 g/dL (ref 12.0–15.0)
MCH: 30 pg (ref 26.0–34.0)
MCHC: 32.6 g/dL (ref 30.0–36.0)
MCV: 92.1 fL (ref 80.0–100.0)
Platelets: 137 10*3/uL — ABNORMAL LOW (ref 150–400)
RBC: 4.33 MIL/uL (ref 3.87–5.11)
RDW: 13.6 % (ref 11.5–15.5)
WBC: 10.1 10*3/uL (ref 4.0–10.5)
nRBC: 0 % (ref 0.0–0.2)

## 2018-06-03 LAB — LIPASE, BLOOD: Lipase: 32 U/L (ref 11–51)

## 2018-06-03 LAB — SARS CORONAVIRUS 2 BY RT PCR (HOSPITAL ORDER, PERFORMED IN ~~LOC~~ HOSPITAL LAB): SARS Coronavirus 2: NEGATIVE

## 2018-06-03 MED ORDER — METRONIDAZOLE 500 MG PO TABS: 500.0000 mg | ORAL_TABLET | Freq: Two times a day (BID) | ORAL | 0 refills | Status: DC

## 2018-06-03 MED ORDER — METRONIDAZOLE 500 MG PO TABS
500.0000 mg | ORAL_TABLET | Freq: Once | ORAL | Status: AC
  Administered 2018-06-03: 18:00:00 500 mg via ORAL
  Filled 2018-06-03: qty 1

## 2018-06-03 MED ORDER — CIPROFLOXACIN HCL 250 MG PO TABS
500.0000 mg | ORAL_TABLET | Freq: Once | ORAL | Status: AC
  Administered 2018-06-03: 18:00:00 500 mg via ORAL
  Filled 2018-06-03: qty 2

## 2018-06-03 MED ORDER — SODIUM CHLORIDE 0.9% FLUSH: 3.0000 mL | Freq: Once | INTRAVENOUS | Status: DC

## 2018-06-03 MED ORDER — CIPROFLOXACIN HCL 250 MG PO TABS: 250.0000 mg | ORAL_TABLET | Freq: Two times a day (BID) | ORAL | 0 refills | Status: DC

## 2018-06-03 MED ORDER — SODIUM CHLORIDE 0.9 % IV BOLUS
500.0000 mL | Freq: Once | INTRAVENOUS | Status: AC
  Administered 2018-06-03: 14:00:00 500 mL via INTRAVENOUS

## 2018-06-03 NOTE — ED Triage Notes (Signed)
Pt c/o of abdominal pain since last night. Denies n/v/d.

## 2018-06-03 NOTE — ED Notes (Signed)
Patient to drink contrast-CT provided.
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
## 2018-06-03 NOTE — ED Notes (Signed)
Pt unable to provide urine sample at this time 

## 2018-06-03 NOTE — Discharge Instructions (Addendum)
You were evaluated in the emergency department for lower abdominal pain.  You had a CAT scan that showed you have diverticulitis.  You also possibly have a urine infection.  We are treating you with 2 antibiotics that you will take twice a day.  Please follow-up with your doctor and return if any worsening pain or fever.

## 2018-06-13 DIAGNOSIS — N3281 Overactive bladder: Secondary | ICD-10-CM | POA: Diagnosis not present

## 2018-06-13 DIAGNOSIS — G3184 Mild cognitive impairment, so stated: Secondary | ICD-10-CM | POA: Diagnosis not present

## 2018-06-13 DIAGNOSIS — F17210 Nicotine dependence, cigarettes, uncomplicated: Secondary | ICD-10-CM | POA: Diagnosis not present

## 2018-06-13 DIAGNOSIS — E782 Mixed hyperlipidemia: Secondary | ICD-10-CM | POA: Diagnosis not present

## 2018-06-13 DIAGNOSIS — R638 Other symptoms and signs concerning food and fluid intake: Secondary | ICD-10-CM | POA: Diagnosis not present

## 2018-06-13 DIAGNOSIS — I129 Hypertensive chronic kidney disease with stage 1 through stage 4 chronic kidney disease, or unspecified chronic kidney disease: Secondary | ICD-10-CM | POA: Diagnosis not present

## 2018-06-13 DIAGNOSIS — R634 Abnormal weight loss: Secondary | ICD-10-CM | POA: Diagnosis not present

## 2018-06-13 DIAGNOSIS — K5730 Diverticulosis of large intestine without perforation or abscess without bleeding: Secondary | ICD-10-CM | POA: Diagnosis not present

## 2018-06-13 DIAGNOSIS — N184 Chronic kidney disease, stage 4 (severe): Secondary | ICD-10-CM | POA: Diagnosis not present

## 2018-06-26 ENCOUNTER — Telehealth: Payer: Self-pay | Admitting: Internal Medicine

## 2018-06-26 NOTE — Telephone Encounter (Signed)

## 2018-06-27 DIAGNOSIS — G3184 Mild cognitive impairment, so stated: Secondary | ICD-10-CM | POA: Diagnosis not present

## 2018-06-28 ENCOUNTER — Encounter: Payer: Self-pay | Admitting: Internal Medicine

## 2018-06-28 ENCOUNTER — Telehealth (INDEPENDENT_AMBULATORY_CARE_PROVIDER_SITE_OTHER): Payer: PPO | Admitting: Internal Medicine

## 2018-06-28 ENCOUNTER — Other Ambulatory Visit: Payer: Self-pay

## 2018-06-28 VITALS — Ht 63.0 in | Wt 145.0 lb

## 2018-06-28 DIAGNOSIS — I495 Sick sinus syndrome: Secondary | ICD-10-CM

## 2018-06-28 DIAGNOSIS — I10 Essential (primary) hypertension: Secondary | ICD-10-CM

## 2018-06-28 DIAGNOSIS — Z950 Presence of cardiac pacemaker: Secondary | ICD-10-CM

## 2018-06-28 NOTE — Patient Instructions (Signed)
Medication Instructions:  Your physician recommends that you continue on your current medications as directed. Please refer to the Current Medication list given to you today.  If you need a refill on your cardiac medications before your next appointment, please call your pharmacy.   Lab work: NONE  If you have labs (blood work) drawn today and your tests are completely normal, you will receive your results only by: Marland Kitchen MyChart Message (if you have MyChart) OR . A paper copy in the mail If you have any lab test that is abnormal or we need to change your treatment, we will call you to review the results.  Testing/Procedures: NONE  Follow-Up: At [facility], you and your health needs are our priority.  As part of our continuing mission to provide you with exceptional heart care, we have created designated Provider Care Teams.  These Care Teams include your primary Cardiologist (physician) and Advanced Practice Providers (APPs -  Physician Assistants and Nurse Practitioners) who all work together to provide you with the care you need, when you need it. You will need a follow up appointment in 6 months.  Please call our office 2 months in advance to schedule this appointment.  You may see Dr. Lovena Le or one of the following Advanced Practice Providers on your designated Care Team:   Chanetta Marshall, NP . Tommye Standard, PA-C  Remote monitoring is used to monitor your Pacemaker of ICD from home. This monitoring reduces the number of office visits required to check your device to one time per year. It allows [place] to keep an eye on the functioning of your device to ensure it is working properly. You are scheduled for a device check from home on 07/01/18. You may send your transmission at any time that day. If you have a wireless device, the transmission will be sent automatically. After your physician reviews your transmission, you will receive a postcard with your next transmission date.   Any Other Special  Instructions Will Be Listed Below (If Applicable). Thank you for choosing [facility]!
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
## 2018-07-06 DIAGNOSIS — R197 Diarrhea, unspecified: Secondary | ICD-10-CM | POA: Diagnosis not present

## 2018-07-06 DIAGNOSIS — R531 Weakness: Secondary | ICD-10-CM | POA: Diagnosis not present

## 2018-07-10 ENCOUNTER — Ambulatory Visit (INDEPENDENT_AMBULATORY_CARE_PROVIDER_SITE_OTHER): Payer: PPO | Admitting: *Deleted

## 2018-07-10 DIAGNOSIS — R001 Bradycardia, unspecified: Secondary | ICD-10-CM

## 2018-07-10 DIAGNOSIS — I495 Sick sinus syndrome: Secondary | ICD-10-CM | POA: Diagnosis not present

## 2018-07-10 LAB — CUP PACEART REMOTE DEVICE CHECK
Battery Remaining Longevity: 110 mo
Battery Remaining Percentage: 95.5 %
Battery Voltage: 3.01 V
Brady Statistic AP VP Percent: 5.3 %
Brady Statistic AP VS Percent: 69 %
Brady Statistic AS VP Percent: 1 %
Brady Statistic AS VS Percent: 25 %
Brady Statistic RA Percent Paced: 74 %
Brady Statistic RV Percent Paced: 5.4 %
Date Time Interrogation Session: 20200629060022
Implantable Lead Implant Date: 20161013
Implantable Lead Implant Date: 20161013
Implantable Lead Location: 753859
Implantable Lead Location: 753860
Implantable Pulse Generator Implant Date: 20161013
Lead Channel Impedance Value: 380 Ohm
Lead Channel Impedance Value: 430 Ohm
Lead Channel Pacing Threshold Amplitude: 0.75 V
Lead Channel Pacing Threshold Amplitude: 0.75 V
Lead Channel Pacing Threshold Pulse Width: 0.5 ms
Lead Channel Pacing Threshold Pulse Width: 0.5 ms
Lead Channel Sensing Intrinsic Amplitude: 1.3 mV
Lead Channel Sensing Intrinsic Amplitude: 12 mV
Lead Channel Setting Pacing Amplitude: 2 V
Lead Channel Setting Pacing Amplitude: 2.5 V
Lead Channel Setting Pacing Pulse Width: 0.5 ms
Lead Channel Setting Sensing Sensitivity: 2 mV
Pulse Gen Model: 2240
Pulse Gen Serial Number: 7820884

## 2018-07-18 DIAGNOSIS — R110 Nausea: Secondary | ICD-10-CM | POA: Diagnosis not present

## 2018-07-18 DIAGNOSIS — N184 Chronic kidney disease, stage 4 (severe): Secondary | ICD-10-CM | POA: Diagnosis not present

## 2018-07-18 DIAGNOSIS — R32 Unspecified urinary incontinence: Secondary | ICD-10-CM | POA: Diagnosis not present

## 2018-07-18 DIAGNOSIS — R531 Weakness: Secondary | ICD-10-CM | POA: Diagnosis not present

## 2018-07-18 DIAGNOSIS — G3184 Mild cognitive impairment, so stated: Secondary | ICD-10-CM | POA: Diagnosis not present

## 2018-07-18 NOTE — Progress Notes (Signed)
Remote pacemaker transmission.   

## 2018-08-10 ENCOUNTER — Other Ambulatory Visit: Payer: Self-pay

## 2018-08-29 DIAGNOSIS — E782 Mixed hyperlipidemia: Secondary | ICD-10-CM | POA: Diagnosis not present

## 2018-08-29 DIAGNOSIS — Z1329 Encounter for screening for other suspected endocrine disorder: Secondary | ICD-10-CM | POA: Diagnosis not present

## 2018-08-29 DIAGNOSIS — I10 Essential (primary) hypertension: Secondary | ICD-10-CM | POA: Diagnosis not present

## 2018-09-04 ENCOUNTER — Encounter: Payer: Self-pay | Admitting: Gastroenterology

## 2018-09-12 DIAGNOSIS — R531 Weakness: Secondary | ICD-10-CM | POA: Diagnosis not present

## 2018-09-12 DIAGNOSIS — G3184 Mild cognitive impairment, so stated: Secondary | ICD-10-CM | POA: Diagnosis not present

## 2018-09-12 DIAGNOSIS — R32 Unspecified urinary incontinence: Secondary | ICD-10-CM | POA: Diagnosis not present

## 2018-09-12 DIAGNOSIS — R110 Nausea: Secondary | ICD-10-CM | POA: Diagnosis not present

## 2018-09-12 DIAGNOSIS — N184 Chronic kidney disease, stage 4 (severe): Secondary | ICD-10-CM | POA: Diagnosis not present

## 2018-10-05 DIAGNOSIS — D3131 Benign neoplasm of right choroid: Secondary | ICD-10-CM | POA: Diagnosis not present

## 2018-10-10 ENCOUNTER — Ambulatory Visit (INDEPENDENT_AMBULATORY_CARE_PROVIDER_SITE_OTHER): Payer: PPO | Admitting: *Deleted

## 2018-10-10 DIAGNOSIS — R011 Cardiac murmur, unspecified: Secondary | ICD-10-CM | POA: Diagnosis not present

## 2018-10-10 DIAGNOSIS — M1990 Unspecified osteoarthritis, unspecified site: Secondary | ICD-10-CM | POA: Diagnosis not present

## 2018-10-10 DIAGNOSIS — N3281 Overactive bladder: Secondary | ICD-10-CM | POA: Diagnosis not present

## 2018-10-10 DIAGNOSIS — I495 Sick sinus syndrome: Secondary | ICD-10-CM

## 2018-10-10 DIAGNOSIS — N184 Chronic kidney disease, stage 4 (severe): Secondary | ICD-10-CM | POA: Diagnosis not present

## 2018-10-10 DIAGNOSIS — I129 Hypertensive chronic kidney disease with stage 1 through stage 4 chronic kidney disease, or unspecified chronic kidney disease: Secondary | ICD-10-CM | POA: Diagnosis not present

## 2018-10-10 DIAGNOSIS — E782 Mixed hyperlipidemia: Secondary | ICD-10-CM | POA: Diagnosis not present

## 2018-10-10 DIAGNOSIS — Z950 Presence of cardiac pacemaker: Secondary | ICD-10-CM | POA: Diagnosis not present

## 2018-10-10 DIAGNOSIS — G3184 Mild cognitive impairment, so stated: Secondary | ICD-10-CM | POA: Diagnosis not present

## 2018-10-10 DIAGNOSIS — F17200 Nicotine dependence, unspecified, uncomplicated: Secondary | ICD-10-CM | POA: Diagnosis not present

## 2018-10-10 LAB — CUP PACEART REMOTE DEVICE CHECK
Battery Remaining Longevity: 110 mo
Battery Remaining Percentage: 95.5 %
Battery Voltage: 3.01 V
Brady Statistic AP VP Percent: 4.9 %
Brady Statistic AP VS Percent: 71 %
Brady Statistic AS VP Percent: 1 %
Brady Statistic AS VS Percent: 24 %
Brady Statistic RA Percent Paced: 75 %
Brady Statistic RV Percent Paced: 4.9 %
Date Time Interrogation Session: 20200928060017
Implantable Lead Implant Date: 20161013
Implantable Lead Implant Date: 20161013
Implantable Lead Location: 753859
Implantable Lead Location: 753860
Implantable Pulse Generator Implant Date: 20161013
Lead Channel Impedance Value: 380 Ohm
Lead Channel Impedance Value: 430 Ohm
Lead Channel Pacing Threshold Amplitude: 0.75 V
Lead Channel Pacing Threshold Amplitude: 0.75 V
Lead Channel Pacing Threshold Pulse Width: 0.5 ms
Lead Channel Pacing Threshold Pulse Width: 0.5 ms
Lead Channel Sensing Intrinsic Amplitude: 1.5 mV
Lead Channel Sensing Intrinsic Amplitude: 12 mV
Lead Channel Setting Pacing Amplitude: 2 V
Lead Channel Setting Pacing Amplitude: 2.5 V
Lead Channel Setting Pacing Pulse Width: 0.5 ms
Lead Channel Setting Sensing Sensitivity: 2 mV
Pulse Gen Model: 2240
Pulse Gen Serial Number: 7820884

## 2018-10-19 DIAGNOSIS — R011 Cardiac murmur, unspecified: Secondary | ICD-10-CM | POA: Diagnosis not present

## 2018-10-19 DIAGNOSIS — N184 Chronic kidney disease, stage 4 (severe): Secondary | ICD-10-CM | POA: Diagnosis not present

## 2018-10-19 DIAGNOSIS — G3184 Mild cognitive impairment, so stated: Secondary | ICD-10-CM | POA: Diagnosis not present

## 2018-10-19 DIAGNOSIS — I129 Hypertensive chronic kidney disease with stage 1 through stage 4 chronic kidney disease, or unspecified chronic kidney disease: Secondary | ICD-10-CM | POA: Diagnosis not present

## 2018-10-19 DIAGNOSIS — I10 Essential (primary) hypertension: Secondary | ICD-10-CM | POA: Diagnosis not present

## 2018-10-19 DIAGNOSIS — E782 Mixed hyperlipidemia: Secondary | ICD-10-CM | POA: Diagnosis not present

## 2018-10-19 DIAGNOSIS — R32 Unspecified urinary incontinence: Secondary | ICD-10-CM | POA: Diagnosis not present

## 2018-10-19 NOTE — Progress Notes (Signed)
Remote pacemaker transmission.   

## 2018-10-25 DIAGNOSIS — N184 Chronic kidney disease, stage 4 (severe): Secondary | ICD-10-CM | POA: Diagnosis not present

## 2018-10-25 DIAGNOSIS — R809 Proteinuria, unspecified: Secondary | ICD-10-CM | POA: Diagnosis not present

## 2018-10-25 DIAGNOSIS — E211 Secondary hyperparathyroidism, not elsewhere classified: Secondary | ICD-10-CM | POA: Diagnosis not present

## 2018-10-25 DIAGNOSIS — Z716 Tobacco abuse counseling: Secondary | ICD-10-CM | POA: Diagnosis not present

## 2018-10-25 DIAGNOSIS — E870 Hyperosmolality and hypernatremia: Secondary | ICD-10-CM | POA: Diagnosis not present

## 2018-10-25 DIAGNOSIS — D631 Anemia in chronic kidney disease: Secondary | ICD-10-CM | POA: Diagnosis not present

## 2018-10-25 DIAGNOSIS — R82998 Other abnormal findings in urine: Secondary | ICD-10-CM | POA: Diagnosis not present

## 2018-10-25 DIAGNOSIS — N200 Calculus of kidney: Secondary | ICD-10-CM | POA: Diagnosis not present

## 2018-10-25 DIAGNOSIS — E559 Vitamin D deficiency, unspecified: Secondary | ICD-10-CM | POA: Diagnosis not present

## 2018-10-26 DIAGNOSIS — N184 Chronic kidney disease, stage 4 (severe): Secondary | ICD-10-CM | POA: Diagnosis not present

## 2018-10-26 DIAGNOSIS — D631 Anemia in chronic kidney disease: Secondary | ICD-10-CM | POA: Diagnosis not present

## 2018-10-26 DIAGNOSIS — N189 Chronic kidney disease, unspecified: Secondary | ICD-10-CM | POA: Diagnosis not present

## 2018-10-26 DIAGNOSIS — N183 Chronic kidney disease, stage 3 unspecified: Secondary | ICD-10-CM | POA: Diagnosis not present

## 2018-11-15 DIAGNOSIS — E870 Hyperosmolality and hypernatremia: Secondary | ICD-10-CM | POA: Diagnosis not present

## 2018-11-15 DIAGNOSIS — R82998 Other abnormal findings in urine: Secondary | ICD-10-CM | POA: Diagnosis not present

## 2018-11-15 DIAGNOSIS — N184 Chronic kidney disease, stage 4 (severe): Secondary | ICD-10-CM | POA: Diagnosis not present

## 2018-11-15 DIAGNOSIS — N189 Chronic kidney disease, unspecified: Secondary | ICD-10-CM | POA: Diagnosis not present

## 2018-11-15 DIAGNOSIS — E559 Vitamin D deficiency, unspecified: Secondary | ICD-10-CM | POA: Diagnosis not present

## 2018-11-15 DIAGNOSIS — Z716 Tobacco abuse counseling: Secondary | ICD-10-CM | POA: Diagnosis not present

## 2018-11-15 DIAGNOSIS — D631 Anemia in chronic kidney disease: Secondary | ICD-10-CM | POA: Diagnosis not present

## 2018-11-15 DIAGNOSIS — E211 Secondary hyperparathyroidism, not elsewhere classified: Secondary | ICD-10-CM | POA: Diagnosis not present

## 2018-11-29 DIAGNOSIS — G3184 Mild cognitive impairment, so stated: Secondary | ICD-10-CM | POA: Diagnosis not present

## 2018-11-29 DIAGNOSIS — N184 Chronic kidney disease, stage 4 (severe): Secondary | ICD-10-CM | POA: Diagnosis not present

## 2018-11-29 DIAGNOSIS — R82998 Other abnormal findings in urine: Secondary | ICD-10-CM | POA: Diagnosis not present

## 2018-11-29 DIAGNOSIS — E870 Hyperosmolality and hypernatremia: Secondary | ICD-10-CM | POA: Diagnosis not present

## 2018-11-29 DIAGNOSIS — D631 Anemia in chronic kidney disease: Secondary | ICD-10-CM | POA: Diagnosis not present

## 2018-11-29 DIAGNOSIS — E782 Mixed hyperlipidemia: Secondary | ICD-10-CM | POA: Diagnosis not present

## 2018-11-29 DIAGNOSIS — Z716 Tobacco abuse counseling: Secondary | ICD-10-CM | POA: Diagnosis not present

## 2018-11-29 DIAGNOSIS — E559 Vitamin D deficiency, unspecified: Secondary | ICD-10-CM | POA: Diagnosis not present

## 2018-11-29 DIAGNOSIS — I10 Essential (primary) hypertension: Secondary | ICD-10-CM | POA: Diagnosis not present

## 2018-11-29 DIAGNOSIS — I129 Hypertensive chronic kidney disease with stage 1 through stage 4 chronic kidney disease, or unspecified chronic kidney disease: Secondary | ICD-10-CM | POA: Diagnosis not present

## 2018-11-29 DIAGNOSIS — R011 Cardiac murmur, unspecified: Secondary | ICD-10-CM | POA: Diagnosis not present

## 2018-11-29 DIAGNOSIS — E211 Secondary hyperparathyroidism, not elsewhere classified: Secondary | ICD-10-CM | POA: Diagnosis not present

## 2018-11-29 DIAGNOSIS — R32 Unspecified urinary incontinence: Secondary | ICD-10-CM | POA: Diagnosis not present

## 2018-11-29 DIAGNOSIS — N189 Chronic kidney disease, unspecified: Secondary | ICD-10-CM | POA: Diagnosis not present

## 2018-11-30 DIAGNOSIS — Z23 Encounter for immunization: Secondary | ICD-10-CM | POA: Diagnosis not present

## 2019-01-08 LAB — CUP PACEART REMOTE DEVICE CHECK
Battery Remaining Longevity: 110 mo
Battery Remaining Percentage: 95.5 %
Battery Voltage: 3.01 V
Brady Statistic AP VP Percent: 4.4 %
Brady Statistic AP VS Percent: 71 %
Brady Statistic AS VP Percent: 1 %
Brady Statistic AS VS Percent: 24 %
Brady Statistic RA Percent Paced: 75 %
Brady Statistic RV Percent Paced: 4.4 %
Date Time Interrogation Session: 20201228033035
Implantable Lead Implant Date: 20161013
Implantable Lead Implant Date: 20161013
Implantable Lead Location: 753859
Implantable Lead Location: 753860
Implantable Pulse Generator Implant Date: 20161013
Lead Channel Impedance Value: 360 Ohm
Lead Channel Impedance Value: 390 Ohm
Lead Channel Pacing Threshold Amplitude: 0.75 V
Lead Channel Pacing Threshold Amplitude: 0.75 V
Lead Channel Pacing Threshold Pulse Width: 0.5 ms
Lead Channel Pacing Threshold Pulse Width: 0.5 ms
Lead Channel Sensing Intrinsic Amplitude: 1.5 mV
Lead Channel Sensing Intrinsic Amplitude: 12 mV
Lead Channel Setting Pacing Amplitude: 2 V
Lead Channel Setting Pacing Amplitude: 2.5 V
Lead Channel Setting Pacing Pulse Width: 0.5 ms
Lead Channel Setting Sensing Sensitivity: 2 mV
Pulse Gen Model: 2240
Pulse Gen Serial Number: 7820884

## 2019-01-09 ENCOUNTER — Ambulatory Visit (INDEPENDENT_AMBULATORY_CARE_PROVIDER_SITE_OTHER): Payer: PPO | Admitting: *Deleted

## 2019-01-09 DIAGNOSIS — Z950 Presence of cardiac pacemaker: Secondary | ICD-10-CM | POA: Diagnosis not present

## 2019-01-15 DIAGNOSIS — R82998 Other abnormal findings in urine: Secondary | ICD-10-CM | POA: Diagnosis not present

## 2019-01-15 DIAGNOSIS — Z79899 Other long term (current) drug therapy: Secondary | ICD-10-CM | POA: Diagnosis not present

## 2019-01-15 DIAGNOSIS — N184 Chronic kidney disease, stage 4 (severe): Secondary | ICD-10-CM | POA: Diagnosis not present

## 2019-01-17 DIAGNOSIS — E7849 Other hyperlipidemia: Secondary | ICD-10-CM | POA: Diagnosis not present

## 2019-01-17 DIAGNOSIS — N200 Calculus of kidney: Secondary | ICD-10-CM | POA: Diagnosis not present

## 2019-01-17 DIAGNOSIS — N184 Chronic kidney disease, stage 4 (severe): Secondary | ICD-10-CM | POA: Diagnosis not present

## 2019-01-17 DIAGNOSIS — R809 Proteinuria, unspecified: Secondary | ICD-10-CM | POA: Diagnosis not present

## 2019-01-17 DIAGNOSIS — Z23 Encounter for immunization: Secondary | ICD-10-CM | POA: Diagnosis not present

## 2019-01-17 DIAGNOSIS — E559 Vitamin D deficiency, unspecified: Secondary | ICD-10-CM | POA: Diagnosis not present

## 2019-01-17 DIAGNOSIS — E870 Hyperosmolality and hypernatremia: Secondary | ICD-10-CM | POA: Diagnosis not present

## 2019-01-17 DIAGNOSIS — E211 Secondary hyperparathyroidism, not elsewhere classified: Secondary | ICD-10-CM | POA: Diagnosis not present

## 2019-01-17 DIAGNOSIS — I129 Hypertensive chronic kidney disease with stage 1 through stage 4 chronic kidney disease, or unspecified chronic kidney disease: Secondary | ICD-10-CM | POA: Diagnosis not present

## 2019-01-17 DIAGNOSIS — Z716 Tobacco abuse counseling: Secondary | ICD-10-CM | POA: Diagnosis not present

## 2019-03-15 DIAGNOSIS — N200 Calculus of kidney: Secondary | ICD-10-CM | POA: Diagnosis not present

## 2019-03-15 DIAGNOSIS — R809 Proteinuria, unspecified: Secondary | ICD-10-CM | POA: Diagnosis not present

## 2019-03-15 DIAGNOSIS — E870 Hyperosmolality and hypernatremia: Secondary | ICD-10-CM | POA: Diagnosis not present

## 2019-03-15 DIAGNOSIS — E211 Secondary hyperparathyroidism, not elsewhere classified: Secondary | ICD-10-CM | POA: Diagnosis not present

## 2019-03-15 DIAGNOSIS — N184 Chronic kidney disease, stage 4 (severe): Secondary | ICD-10-CM | POA: Diagnosis not present

## 2019-03-15 DIAGNOSIS — E559 Vitamin D deficiency, unspecified: Secondary | ICD-10-CM | POA: Diagnosis not present

## 2019-03-21 DIAGNOSIS — R809 Proteinuria, unspecified: Secondary | ICD-10-CM | POA: Diagnosis not present

## 2019-03-21 DIAGNOSIS — R42 Dizziness and giddiness: Secondary | ICD-10-CM | POA: Diagnosis not present

## 2019-03-21 DIAGNOSIS — E211 Secondary hyperparathyroidism, not elsewhere classified: Secondary | ICD-10-CM | POA: Diagnosis not present

## 2019-03-21 DIAGNOSIS — N184 Chronic kidney disease, stage 4 (severe): Secondary | ICD-10-CM | POA: Diagnosis not present

## 2019-03-21 DIAGNOSIS — N200 Calculus of kidney: Secondary | ICD-10-CM | POA: Diagnosis not present

## 2019-03-21 DIAGNOSIS — I129 Hypertensive chronic kidney disease with stage 1 through stage 4 chronic kidney disease, or unspecified chronic kidney disease: Secondary | ICD-10-CM | POA: Diagnosis not present

## 2019-03-21 DIAGNOSIS — Z79899 Other long term (current) drug therapy: Secondary | ICD-10-CM | POA: Diagnosis not present

## 2019-04-03 DIAGNOSIS — E7849 Other hyperlipidemia: Secondary | ICD-10-CM | POA: Diagnosis not present

## 2019-04-03 DIAGNOSIS — E782 Mixed hyperlipidemia: Secondary | ICD-10-CM | POA: Diagnosis not present

## 2019-04-03 DIAGNOSIS — K5730 Diverticulosis of large intestine without perforation or abscess without bleeding: Secondary | ICD-10-CM | POA: Diagnosis not present

## 2019-04-03 DIAGNOSIS — H00025 Hordeolum internum left lower eyelid: Secondary | ICD-10-CM | POA: Diagnosis not present

## 2019-04-03 DIAGNOSIS — I129 Hypertensive chronic kidney disease with stage 1 through stage 4 chronic kidney disease, or unspecified chronic kidney disease: Secondary | ICD-10-CM | POA: Diagnosis not present

## 2019-04-03 DIAGNOSIS — I10 Essential (primary) hypertension: Secondary | ICD-10-CM | POA: Diagnosis not present

## 2019-04-03 DIAGNOSIS — H6122 Impacted cerumen, left ear: Secondary | ICD-10-CM | POA: Diagnosis not present

## 2019-04-03 DIAGNOSIS — J069 Acute upper respiratory infection, unspecified: Secondary | ICD-10-CM | POA: Diagnosis not present

## 2019-04-03 DIAGNOSIS — M1990 Unspecified osteoarthritis, unspecified site: Secondary | ICD-10-CM | POA: Diagnosis not present

## 2019-04-03 DIAGNOSIS — F17210 Nicotine dependence, cigarettes, uncomplicated: Secondary | ICD-10-CM | POA: Diagnosis not present

## 2019-04-03 DIAGNOSIS — F17218 Nicotine dependence, cigarettes, with other nicotine-induced disorders: Secondary | ICD-10-CM | POA: Diagnosis not present

## 2019-04-03 DIAGNOSIS — G3184 Mild cognitive impairment, so stated: Secondary | ICD-10-CM | POA: Diagnosis not present

## 2019-04-04 ENCOUNTER — Other Ambulatory Visit: Payer: Self-pay

## 2019-04-04 ENCOUNTER — Telehealth: Payer: Self-pay | Admitting: Internal Medicine

## 2019-04-04 ENCOUNTER — Ambulatory Visit (INDEPENDENT_AMBULATORY_CARE_PROVIDER_SITE_OTHER): Payer: PPO | Admitting: Internal Medicine

## 2019-04-04 ENCOUNTER — Encounter: Payer: Self-pay | Admitting: Internal Medicine

## 2019-04-04 VITALS — BP 151/72 | HR 68 | Temp 97.5°F | Ht 62.0 in | Wt 151.0 lb

## 2019-04-04 DIAGNOSIS — I350 Nonrheumatic aortic (valve) stenosis: Secondary | ICD-10-CM

## 2019-04-04 DIAGNOSIS — I495 Sick sinus syndrome: Secondary | ICD-10-CM

## 2019-04-04 DIAGNOSIS — Z950 Presence of cardiac pacemaker: Secondary | ICD-10-CM

## 2019-04-04 LAB — CUP PACEART INCLINIC DEVICE CHECK
Battery Remaining Longevity: 122 mo
Battery Voltage: 2.99 V
Brady Statistic RA Percent Paced: 76 %
Brady Statistic RV Percent Paced: 4.1 %
Date Time Interrogation Session: 20210324110449
Implantable Lead Implant Date: 20161013
Implantable Lead Implant Date: 20161013
Implantable Lead Location: 753859
Implantable Lead Location: 753860
Implantable Pulse Generator Implant Date: 20161013
Lead Channel Impedance Value: 425 Ohm
Lead Channel Impedance Value: 437.5 Ohm
Lead Channel Pacing Threshold Amplitude: 0.75 V
Lead Channel Pacing Threshold Amplitude: 0.75 V
Lead Channel Pacing Threshold Pulse Width: 0.5 ms
Lead Channel Pacing Threshold Pulse Width: 0.5 ms
Lead Channel Sensing Intrinsic Amplitude: 12 mV
Lead Channel Sensing Intrinsic Amplitude: 2.4 mV
Lead Channel Setting Pacing Amplitude: 2 V
Lead Channel Setting Pacing Amplitude: 2.5 V
Lead Channel Setting Pacing Pulse Width: 0.5 ms
Lead Channel Setting Sensing Sensitivity: 2 mV
Pulse Gen Model: 2240
Pulse Gen Serial Number: 7820884

## 2019-04-04 NOTE — Patient Instructions (Signed)
Medication Instructions:  Your physician recommends that you continue on your current medications as directed. Please refer to the Current Medication list given to you today.  *If you need a refill on your cardiac medications before your next appointment, please call your pharmacy*   Lab Work: NONE   If you have labs (blood work) drawn today and your tests are completely normal, you will receive your results only by: . MyChart Message (if you have MyChart) OR . A paper copy in the mail If you have any lab test that is abnormal or we need to change your treatment, we will call you to review the results.   Testing/Procedures: Your physician has requested that you have an echocardiogram. Echocardiography is a painless test that uses sound waves to create images of your heart. It provides your doctor with information about the size and shape of your heart and how well your heart's chambers and valves are working. This procedure takes approximately one hour. There are no restrictions for this procedure.   Follow-Up: At [facility], you and your health needs are our priority.  As part of our continuing mission to provide you with exceptional heart care, we have created designated Provider Care Teams.  These Care Teams include your primary Cardiologist (physician) and Advanced Practice Providers (APPs -  Physician Assistants and Nurse Practitioners) who all work together to provide you with the care you need, when you need it.  We recommend signing up for the patient portal called "MyChart".  Sign up information is provided on this After Visit Summary.  MyChart is used to connect with patients for Virtual Visits (Telemedicine).  Patients are able to view lab/test results, encounter notes, upcoming appointments, etc.  Non-urgent messages can be sent to your provider as well.   To learn more about what you can do with MyChart, go to [email or web address].    Your next appointment:   1  year(s)  The format for your next appointment:   In Person  Provider:   Gregg Taylor, MD   Other Instructions Thank you for choosing [facility]!    

## 2019-04-04 NOTE — Telephone Encounter (Signed)
Spoke with daughter [name]. States she would like to know about today's visit. States that her mother is unable to remember and understand. Please advise.

## 2019-04-04 NOTE — Telephone Encounter (Signed)
Daughter called asking about test for Monday and if her mom can travel.
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
## 2019-04-05 ENCOUNTER — Encounter: Payer: PPO | Admitting: Internal Medicine

## 2019-04-09 ENCOUNTER — Ambulatory Visit (HOSPITAL_COMMUNITY)
Admission: RE | Admit: 2019-04-09 | Discharge: 2019-04-09 | Disposition: A | Discharge status: Home / Self Care | Payer: PPO | Source: Ambulatory Visit | Attending: Internal Medicine | Admitting: Internal Medicine

## 2019-04-09 ENCOUNTER — Other Ambulatory Visit: Payer: Self-pay

## 2019-04-09 DIAGNOSIS — I350 Nonrheumatic aortic (valve) stenosis: Secondary | ICD-10-CM

## 2019-04-09 NOTE — Progress Notes (Signed)
*  PRELIMINARY RESULTS* Echocardiogram 2D Echocardiogram has been performed.  Ashley Olsen 04/09/2019, 11:06 AM

## 2019-04-09 NOTE — Telephone Encounter (Signed)
Spoke with daughter [name].    [name] had previously spoken with daughter and all questions answered.  [name] would like to be notified of Pt's echo results when they are finalized.

## 2019-04-10 ENCOUNTER — Ambulatory Visit (INDEPENDENT_AMBULATORY_CARE_PROVIDER_SITE_OTHER): Payer: PPO | Admitting: *Deleted

## 2019-04-10 DIAGNOSIS — E782 Mixed hyperlipidemia: Secondary | ICD-10-CM | POA: Diagnosis not present

## 2019-04-10 DIAGNOSIS — Z950 Presence of cardiac pacemaker: Secondary | ICD-10-CM | POA: Diagnosis not present

## 2019-04-10 DIAGNOSIS — I350 Nonrheumatic aortic (valve) stenosis: Secondary | ICD-10-CM | POA: Diagnosis not present

## 2019-04-10 DIAGNOSIS — Z0001 Encounter for general adult medical examination with abnormal findings: Secondary | ICD-10-CM | POA: Diagnosis not present

## 2019-04-10 DIAGNOSIS — N184 Chronic kidney disease, stage 4 (severe): Secondary | ICD-10-CM | POA: Diagnosis not present

## 2019-04-10 DIAGNOSIS — G3184 Mild cognitive impairment, so stated: Secondary | ICD-10-CM | POA: Diagnosis not present

## 2019-04-10 DIAGNOSIS — R011 Cardiac murmur, unspecified: Secondary | ICD-10-CM | POA: Diagnosis not present

## 2019-04-10 DIAGNOSIS — M1990 Unspecified osteoarthritis, unspecified site: Secondary | ICD-10-CM | POA: Diagnosis not present

## 2019-04-10 DIAGNOSIS — R7301 Impaired fasting glucose: Secondary | ICD-10-CM | POA: Diagnosis not present

## 2019-04-10 DIAGNOSIS — I129 Hypertensive chronic kidney disease with stage 1 through stage 4 chronic kidney disease, or unspecified chronic kidney disease: Secondary | ICD-10-CM | POA: Diagnosis not present

## 2019-04-10 DIAGNOSIS — F17200 Nicotine dependence, unspecified, uncomplicated: Secondary | ICD-10-CM | POA: Diagnosis not present

## 2019-04-10 DIAGNOSIS — N3281 Overactive bladder: Secondary | ICD-10-CM | POA: Diagnosis not present

## 2019-04-10 LAB — CUP PACEART REMOTE DEVICE CHECK
Battery Remaining Longevity: 112 mo
Battery Remaining Percentage: 95.5 %
Battery Voltage: 2.99 V
Brady Statistic AP VP Percent: 1 %
Brady Statistic AP VS Percent: 72 %
Brady Statistic AS VP Percent: 1 %
Brady Statistic AS VS Percent: 27 %
Brady Statistic RA Percent Paced: 72 %
Brady Statistic RV Percent Paced: 1 %
Date Time Interrogation Session: 20210330031617
Implantable Lead Implant Date: 20161013
Implantable Lead Implant Date: 20161013
Implantable Lead Location: 753859
Implantable Lead Location: 753860
Implantable Pulse Generator Implant Date: 20161013
Lead Channel Impedance Value: 390 Ohm
Lead Channel Impedance Value: 410 Ohm
Lead Channel Pacing Threshold Amplitude: 0.75 V
Lead Channel Pacing Threshold Amplitude: 0.75 V
Lead Channel Pacing Threshold Pulse Width: 0.5 ms
Lead Channel Pacing Threshold Pulse Width: 0.5 ms
Lead Channel Sensing Intrinsic Amplitude: 1.5 mV
Lead Channel Sensing Intrinsic Amplitude: 12 mV
Lead Channel Setting Pacing Amplitude: 2 V
Lead Channel Setting Pacing Amplitude: 2.5 V
Lead Channel Setting Pacing Pulse Width: 0.5 ms
Lead Channel Setting Sensing Sensitivity: 2 mV
Pulse Gen Model: 2240
Pulse Gen Serial Number: 7820884

## 2019-04-10 NOTE — Progress Notes (Signed)
PPM remote 

## 2019-04-10 NOTE — Telephone Encounter (Signed)
Her pumping function is good. Her valve has some narrowing and will need to be rechecked in 2 years. It is not severely narrowed. GT

## 2019-04-13 ENCOUNTER — Telehealth: Payer: Self-pay

## 2019-04-13 NOTE — Telephone Encounter (Signed)
Will forward to Dr. Tanna Furry device team to help.

## 2019-04-13 NOTE — Telephone Encounter (Signed)
Pt is going out of town for 3wks and would like advice about upcoming remote check.  Please call [phone]  Thanks renee

## 2019-04-13 NOTE — Telephone Encounter (Signed)
LMOM requesting call back to DC. Direct number provided.

## 2019-04-19 NOTE — Telephone Encounter (Signed)
LMOVM for pt to return my call.  

## 2019-04-20 NOTE — Telephone Encounter (Signed)
LMOVM for pt to return my phone call.  

## 2019-04-25 NOTE — Telephone Encounter (Signed)
Fourth attempt:  [facility] requesting call back to DC. Direct number and office hours provided. Encounter closed as unable to reach patient.

## 2019-05-07 NOTE — Telephone Encounter (Signed)
LMOVM requesting that pt call direct DC phone number if she still has questions. Direct number and office hours provided.

## 2019-05-07 NOTE — Telephone Encounter (Signed)
Patient returned call. Pt wanted to make sure that she did not miss a transmission while she was out of town. Advised that next transmission is on 07/10/19. Pt verbalizes understanding and denies additional questions or concerns at this time.

## 2019-05-07 NOTE — Telephone Encounter (Signed)
Pt came to the office stating she's been out of town for a few weeks and received the messages -  Please give pt a call -- [phone]

## 2019-05-14 DIAGNOSIS — R32 Unspecified urinary incontinence: Secondary | ICD-10-CM | POA: Diagnosis not present

## 2019-05-14 DIAGNOSIS — N184 Chronic kidney disease, stage 4 (severe): Secondary | ICD-10-CM | POA: Diagnosis not present

## 2019-05-14 DIAGNOSIS — M810 Age-related osteoporosis without current pathological fracture: Secondary | ICD-10-CM | POA: Diagnosis not present

## 2019-05-14 DIAGNOSIS — I129 Hypertensive chronic kidney disease with stage 1 through stage 4 chronic kidney disease, or unspecified chronic kidney disease: Secondary | ICD-10-CM | POA: Diagnosis not present

## 2019-05-14 DIAGNOSIS — R110 Nausea: Secondary | ICD-10-CM | POA: Diagnosis not present

## 2019-05-14 DIAGNOSIS — R531 Weakness: Secondary | ICD-10-CM | POA: Diagnosis not present

## 2019-05-14 DIAGNOSIS — E785 Hyperlipidemia, unspecified: Secondary | ICD-10-CM | POA: Diagnosis not present

## 2019-05-14 DIAGNOSIS — G3184 Mild cognitive impairment, so stated: Secondary | ICD-10-CM | POA: Diagnosis not present

## 2019-05-23 ENCOUNTER — Other Ambulatory Visit (HOSPITAL_COMMUNITY)
Admission: RE | Admit: 2019-05-23 | Discharge: 2019-05-23 | Disposition: A | Discharge status: Home / Self Care | Payer: PPO | Source: Ambulatory Visit | Attending: Nephrology | Admitting: Nephrology

## 2019-05-23 ENCOUNTER — Other Ambulatory Visit: Payer: Self-pay

## 2019-05-23 DIAGNOSIS — N200 Calculus of kidney: Secondary | ICD-10-CM | POA: Diagnosis not present

## 2019-05-23 DIAGNOSIS — R42 Dizziness and giddiness: Secondary | ICD-10-CM | POA: Insufficient documentation

## 2019-05-23 DIAGNOSIS — N189 Chronic kidney disease, unspecified: Secondary | ICD-10-CM | POA: Diagnosis not present

## 2019-05-23 DIAGNOSIS — R809 Proteinuria, unspecified: Secondary | ICD-10-CM | POA: Diagnosis not present

## 2019-05-23 DIAGNOSIS — E211 Secondary hyperparathyroidism, not elsewhere classified: Secondary | ICD-10-CM | POA: Insufficient documentation

## 2019-05-23 DIAGNOSIS — N393 Stress incontinence (female) (male): Secondary | ICD-10-CM | POA: Diagnosis not present

## 2019-05-23 DIAGNOSIS — I129 Hypertensive chronic kidney disease with stage 1 through stage 4 chronic kidney disease, or unspecified chronic kidney disease: Secondary | ICD-10-CM | POA: Insufficient documentation

## 2019-05-23 DIAGNOSIS — D631 Anemia in chronic kidney disease: Secondary | ICD-10-CM | POA: Diagnosis not present

## 2019-05-23 DIAGNOSIS — N184 Chronic kidney disease, stage 4 (severe): Secondary | ICD-10-CM | POA: Diagnosis not present

## 2019-05-23 DIAGNOSIS — Z79899 Other long term (current) drug therapy: Secondary | ICD-10-CM | POA: Insufficient documentation

## 2019-05-23 DIAGNOSIS — E559 Vitamin D deficiency, unspecified: Secondary | ICD-10-CM | POA: Diagnosis not present

## 2019-05-23 LAB — CBC
HCT: 42.6 % (ref 36.0–46.0)
Hemoglobin: 13.4 g/dL (ref 12.0–15.0)
MCH: 29.8 pg (ref 26.0–34.0)
MCHC: 31.5 g/dL (ref 30.0–36.0)
MCV: 94.9 fL (ref 80.0–100.0)
Platelets: 154 10*3/uL (ref 150–400)
RBC: 4.49 MIL/uL (ref 3.87–5.11)
RDW: 13.3 % (ref 11.5–15.5)
WBC: 8.2 10*3/uL (ref 4.0–10.5)
nRBC: 0 % (ref 0.0–0.2)

## 2019-05-23 LAB — RENAL FUNCTION PANEL
Albumin: 3.8 g/dL (ref 3.5–5.0)
Anion gap: 8 (ref 5–15)
BUN: 30 mg/dL — ABNORMAL HIGH (ref 8–23)
CO2: 25 mmol/L (ref 22–32)
Calcium: 8.9 mg/dL (ref 8.9–10.3)
Chloride: 110 mmol/L (ref 98–111)
Creatinine, Ser: 2.34 mg/dL — ABNORMAL HIGH (ref 0.44–1.00)
GFR calc Af Amer: 21 mL/min — ABNORMAL LOW (ref >60)
GFR calc non Af Amer: 18 mL/min — ABNORMAL LOW (ref >60)
Glucose, Bld: 89 mg/dL (ref 70–99)
Phosphorus: 3.4 mg/dL (ref 2.5–4.6)
Potassium: 3.9 mmol/L (ref 3.5–5.1)
Sodium: 143 mmol/L (ref 135–145)

## 2019-05-23 LAB — IRON AND TIBC
Iron: 69 ug/dL (ref 28–170)
Saturation Ratios: 19 % (ref 10.4–31.8)
TIBC: 354 ug/dL (ref 250–450)
UIBC: 285 ug/dL

## 2019-05-23 LAB — PROTEIN / CREATININE RATIO, URINE
Creatinine, Urine: 161.8 mg/dL
Protein Creatinine Ratio: 0.63 mg/mg{Cre} — ABNORMAL HIGH (ref 0.00–0.15)
Total Protein, Urine: 102 mg/dL

## 2019-05-28 DIAGNOSIS — M25571 Pain in right ankle and joints of right foot: Secondary | ICD-10-CM | POA: Diagnosis not present

## 2019-06-12 DIAGNOSIS — H5711 Ocular pain, right eye: Secondary | ICD-10-CM | POA: Diagnosis not present

## 2019-06-12 DIAGNOSIS — I10 Essential (primary) hypertension: Secondary | ICD-10-CM | POA: Diagnosis not present

## 2019-06-13 ENCOUNTER — Ambulatory Visit: Payer: PPO | Admitting: Orthopaedic Surgery

## 2019-06-13 ENCOUNTER — Other Ambulatory Visit: Payer: Self-pay

## 2019-06-20 ENCOUNTER — Other Ambulatory Visit: Payer: Self-pay

## 2019-06-20 ENCOUNTER — Emergency Department (HOSPITAL_COMMUNITY): Payer: PPO

## 2019-06-20 ENCOUNTER — Encounter (HOSPITAL_COMMUNITY): Payer: Self-pay | Admitting: Emergency Medicine

## 2019-06-20 ENCOUNTER — Inpatient Hospital Stay (HOSPITAL_COMMUNITY)
Admission: EM | Admit: 2019-06-20 | Discharge: 2019-06-22 | DRG: 377 | Disposition: A | Discharge status: Home / Self Care | Payer: PPO | Attending: Family Medicine | Admitting: Family Medicine

## 2019-06-20 DIAGNOSIS — K5733 Diverticulitis of large intestine without perforation or abscess with bleeding: Principal | ICD-10-CM | POA: Diagnosis present

## 2019-06-20 DIAGNOSIS — F17200 Nicotine dependence, unspecified, uncomplicated: Secondary | ICD-10-CM | POA: Diagnosis present

## 2019-06-20 DIAGNOSIS — D649 Anemia, unspecified: Secondary | ICD-10-CM | POA: Diagnosis not present

## 2019-06-20 DIAGNOSIS — D62 Acute posthemorrhagic anemia: Secondary | ICD-10-CM | POA: Diagnosis present

## 2019-06-20 DIAGNOSIS — K859 Acute pancreatitis without necrosis or infection, unspecified: Secondary | ICD-10-CM | POA: Diagnosis not present

## 2019-06-20 DIAGNOSIS — K921 Melena: Secondary | ICD-10-CM | POA: Diagnosis not present

## 2019-06-20 DIAGNOSIS — N179 Acute kidney failure, unspecified: Secondary | ICD-10-CM | POA: Diagnosis present

## 2019-06-20 DIAGNOSIS — G3184 Mild cognitive impairment, so stated: Secondary | ICD-10-CM | POA: Diagnosis present

## 2019-06-20 DIAGNOSIS — K5732 Diverticulitis of large intestine without perforation or abscess without bleeding: Secondary | ICD-10-CM | POA: Diagnosis not present

## 2019-06-20 DIAGNOSIS — R4189 Other symptoms and signs involving cognitive functions and awareness: Secondary | ICD-10-CM | POA: Diagnosis present

## 2019-06-20 DIAGNOSIS — Z823 Family history of stroke: Secondary | ICD-10-CM

## 2019-06-20 DIAGNOSIS — Z825 Family history of asthma and other chronic lower respiratory diseases: Secondary | ICD-10-CM | POA: Diagnosis not present

## 2019-06-20 DIAGNOSIS — Z8371 Family history of colonic polyps: Secondary | ICD-10-CM | POA: Diagnosis not present

## 2019-06-20 DIAGNOSIS — J939 Pneumothorax, unspecified: Secondary | ICD-10-CM | POA: Diagnosis not present

## 2019-06-20 DIAGNOSIS — F17210 Nicotine dependence, cigarettes, uncomplicated: Secondary | ICD-10-CM | POA: Diagnosis present

## 2019-06-20 DIAGNOSIS — K449 Diaphragmatic hernia without obstruction or gangrene: Secondary | ICD-10-CM | POA: Diagnosis not present

## 2019-06-20 DIAGNOSIS — J9801 Acute bronchospasm: Secondary | ICD-10-CM | POA: Diagnosis present

## 2019-06-20 DIAGNOSIS — N184 Chronic kidney disease, stage 4 (severe): Secondary | ICD-10-CM | POA: Diagnosis present

## 2019-06-20 DIAGNOSIS — R0902 Hypoxemia: Secondary | ICD-10-CM | POA: Diagnosis not present

## 2019-06-20 DIAGNOSIS — J209 Acute bronchitis, unspecified: Secondary | ICD-10-CM

## 2019-06-20 DIAGNOSIS — K7689 Other specified diseases of liver: Secondary | ICD-10-CM | POA: Diagnosis not present

## 2019-06-20 DIAGNOSIS — K5792 Diverticulitis of intestine, part unspecified, without perforation or abscess without bleeding: Secondary | ICD-10-CM

## 2019-06-20 DIAGNOSIS — R05 Cough: Secondary | ICD-10-CM | POA: Diagnosis not present

## 2019-06-20 DIAGNOSIS — E785 Hyperlipidemia, unspecified: Secondary | ICD-10-CM | POA: Diagnosis present

## 2019-06-20 DIAGNOSIS — Z820 Family history of epilepsy and other diseases of the nervous system: Secondary | ICD-10-CM

## 2019-06-20 DIAGNOSIS — Z950 Presence of cardiac pacemaker: Secondary | ICD-10-CM | POA: Diagnosis not present

## 2019-06-20 DIAGNOSIS — R1084 Generalized abdominal pain: Secondary | ICD-10-CM | POA: Diagnosis not present

## 2019-06-20 DIAGNOSIS — J449 Chronic obstructive pulmonary disease, unspecified: Secondary | ICD-10-CM | POA: Diagnosis present

## 2019-06-20 DIAGNOSIS — Z90710 Acquired absence of both cervix and uterus: Secondary | ICD-10-CM

## 2019-06-20 DIAGNOSIS — I129 Hypertensive chronic kidney disease with stage 1 through stage 4 chronic kidney disease, or unspecified chronic kidney disease: Secondary | ICD-10-CM | POA: Diagnosis present

## 2019-06-20 DIAGNOSIS — Z79891 Long term (current) use of opiate analgesic: Secondary | ICD-10-CM | POA: Diagnosis not present

## 2019-06-20 DIAGNOSIS — Z800 Family history of malignant neoplasm of digestive organs: Secondary | ICD-10-CM

## 2019-06-20 DIAGNOSIS — Z20822 Contact with and (suspected) exposure to covid-19: Secondary | ICD-10-CM | POA: Diagnosis not present

## 2019-06-20 DIAGNOSIS — I495 Sick sinus syndrome: Secondary | ICD-10-CM | POA: Diagnosis not present

## 2019-06-20 DIAGNOSIS — Z9049 Acquired absence of other specified parts of digestive tract: Secondary | ICD-10-CM

## 2019-06-20 DIAGNOSIS — Z79899 Other long term (current) drug therapy: Secondary | ICD-10-CM | POA: Diagnosis not present

## 2019-06-20 DIAGNOSIS — F0390 Unspecified dementia without behavioral disturbance: Secondary | ICD-10-CM | POA: Diagnosis present

## 2019-06-20 DIAGNOSIS — I10 Essential (primary) hypertension: Secondary | ICD-10-CM | POA: Diagnosis present

## 2019-06-20 DIAGNOSIS — I4430 Unspecified atrioventricular block: Secondary | ICD-10-CM | POA: Diagnosis not present

## 2019-06-20 DIAGNOSIS — Z833 Family history of diabetes mellitus: Secondary | ICD-10-CM | POA: Diagnosis not present

## 2019-06-20 DIAGNOSIS — I440 Atrioventricular block, first degree: Secondary | ICD-10-CM | POA: Diagnosis not present

## 2019-06-20 DIAGNOSIS — R52 Pain, unspecified: Secondary | ICD-10-CM | POA: Diagnosis not present

## 2019-06-20 HISTORY — DX: Anemia, unspecified: D64.9

## 2019-06-20 LAB — URINALYSIS, ROUTINE W REFLEX MICROSCOPIC
Bilirubin Urine: NEGATIVE
Glucose, UA: NEGATIVE mg/dL
Ketones, ur: NEGATIVE mg/dL
Nitrite: NEGATIVE
Protein, ur: 100 mg/dL — AB
Specific Gravity, Urine: 1.015 (ref 1.005–1.030)
pH: 5 (ref 5.0–8.0)

## 2019-06-20 LAB — CBC
HCT: 43.1 % (ref 36.0–46.0)
Hemoglobin: 13.5 g/dL (ref 12.0–15.0)
MCH: 30.1 pg (ref 26.0–34.0)
MCHC: 31.3 g/dL (ref 30.0–36.0)
MCV: 96.2 fL (ref 80.0–100.0)
Platelets: 156 10*3/uL (ref 150–400)
RBC: 4.48 MIL/uL (ref 3.87–5.11)
RDW: 13.5 % (ref 11.5–15.5)
WBC: 10.8 10*3/uL — ABNORMAL HIGH (ref 4.0–10.5)
nRBC: 0 % (ref 0.0–0.2)

## 2019-06-20 LAB — COMPREHENSIVE METABOLIC PANEL
ALT: 15 U/L (ref 0–44)
AST: 21 U/L (ref 15–41)
Albumin: 3.9 g/dL (ref 3.5–5.0)
Alkaline Phosphatase: 75 U/L (ref 38–126)
Anion gap: 12 (ref 5–15)
BUN: 34 mg/dL — ABNORMAL HIGH (ref 8–23)
CO2: 26 mmol/L (ref 22–32)
Calcium: 9.3 mg/dL (ref 8.9–10.3)
Chloride: 106 mmol/L (ref 98–111)
Creatinine, Ser: 2.59 mg/dL — ABNORMAL HIGH (ref 0.44–1.00)
GFR calc Af Amer: 19 mL/min — ABNORMAL LOW (ref >60)
GFR calc non Af Amer: 16 mL/min — ABNORMAL LOW (ref >60)
Glucose, Bld: 147 mg/dL — ABNORMAL HIGH (ref 70–99)
Potassium: 4.2 mmol/L (ref 3.5–5.1)
Sodium: 144 mmol/L (ref 135–145)
Total Bilirubin: 0.4 mg/dL (ref 0.3–1.2)
Total Protein: 7 g/dL (ref 6.5–8.1)

## 2019-06-20 LAB — SARS CORONAVIRUS 2 BY RT PCR (HOSPITAL ORDER, PERFORMED IN ~~LOC~~ HOSPITAL LAB): SARS Coronavirus 2: NEGATIVE

## 2019-06-20 LAB — POC OCCULT BLOOD, ED: Fecal Occult Bld: POSITIVE — AB

## 2019-06-20 LAB — LIPASE, BLOOD: Lipase: 150 U/L — ABNORMAL HIGH (ref 11–51)

## 2019-06-20 MED ORDER — OXYCODONE HCL 5 MG PO TABS
5.0000 mg | ORAL_TABLET | ORAL | Status: DC | PRN
  Administered 2019-06-21: 5 mg via ORAL
  Filled 2019-06-20: qty 1

## 2019-06-20 MED ORDER — MEMANTINE HCL 10 MG PO TABS
5.0000 mg | ORAL_TABLET | Freq: Every day | ORAL | Status: DC
  Administered 2019-06-21 – 2019-06-22 (×2): 5 mg via ORAL
  Filled 2019-06-20 (×5): qty 1

## 2019-06-20 MED ORDER — SODIUM CHLORIDE 0.9 % IV SOLN: 250.0000 mL | INTRAVENOUS | Status: DC | PRN

## 2019-06-20 MED ORDER — METRONIDAZOLE 500 MG PO TABS: 500.0000 mg | ORAL_TABLET | Freq: Once | ORAL | Status: DC

## 2019-06-20 MED ORDER — ONDANSETRON HCL 4 MG/2ML IJ SOLN
4.0000 mg | Freq: Four times a day (QID) | INTRAMUSCULAR | Status: DC | PRN
  Administered 2019-06-20: 4 mg via INTRAVENOUS
  Filled 2019-06-20: qty 2

## 2019-06-20 MED ORDER — ADULT MULTIVITAMIN W/MINERALS CH
1.0000 | ORAL_TABLET | Freq: Every day | ORAL | Status: DC
  Administered 2019-06-21 – 2019-06-22 (×2): 1 via ORAL
  Filled 2019-06-20 (×2): qty 1

## 2019-06-20 MED ORDER — ALBUTEROL SULFATE HFA 108 (90 BASE) MCG/ACT IN AERS
2.0000 | INHALATION_SPRAY | Freq: Once | RESPIRATORY_TRACT | Status: AC
  Administered 2019-06-20: 2 via RESPIRATORY_TRACT
  Filled 2019-06-20: qty 6.7

## 2019-06-20 MED ORDER — SODIUM CHLORIDE 0.9 % IV SOLN: INTRAVENOUS | Status: DC

## 2019-06-20 MED ORDER — ALBUTEROL SULFATE (2.5 MG/3ML) 0.083% IN NEBU: 2.5000 mg | INHALATION_SOLUTION | RESPIRATORY_TRACT | Status: DC | PRN

## 2019-06-20 MED ORDER — NEOMYCIN-POLYMYXIN-DEXAMETH 3.5-10000-0.1 OP SUSP
2.0000 [drp] | Freq: Four times a day (QID) | OPHTHALMIC | Status: DC
  Administered 2019-06-20 – 2019-06-22 (×7): 2 [drp] via OPHTHALMIC
  Filled 2019-06-20 (×2): qty 5

## 2019-06-20 MED ORDER — FENTANYL CITRATE (PF) 100 MCG/2ML IJ SOLN
12.5000 ug | INTRAMUSCULAR | Status: DC | PRN
  Administered 2019-06-20 (×2): 12.5 ug via INTRAVENOUS
  Filled 2019-06-20 (×2): qty 2

## 2019-06-20 MED ORDER — POLYETHYLENE GLYCOL 3350 17 G PO PACK: 17.0000 g | PACK | Freq: Every day | ORAL | Status: DC | PRN

## 2019-06-20 MED ORDER — AMLODIPINE BESYLATE 5 MG PO TABS
5.0000 mg | ORAL_TABLET | Freq: Every day | ORAL | Status: DC
  Administered 2019-06-21 – 2019-06-22 (×2): 5 mg via ORAL
  Filled 2019-06-20 (×2): qty 1

## 2019-06-20 MED ORDER — SODIUM CHLORIDE 0.9% FLUSH: 3.0000 mL | INTRAVENOUS | Status: DC | PRN

## 2019-06-20 MED ORDER — TRAMADOL HCL 50 MG PO TABS: 50.0000 mg | ORAL_TABLET | Freq: Four times a day (QID) | ORAL | Status: DC | PRN

## 2019-06-20 MED ORDER — METRONIDAZOLE IN NACL 5-0.79 MG/ML-% IV SOLN
500.0000 mg | Freq: Three times a day (TID) | INTRAVENOUS | Status: DC
  Administered 2019-06-20 – 2019-06-22 (×7): 500 mg via INTRAVENOUS
  Filled 2019-06-20 (×7): qty 100

## 2019-06-20 MED ORDER — ACETAMINOPHEN 650 MG RE SUPP: 650.0000 mg | Freq: Four times a day (QID) | RECTAL | Status: DC | PRN

## 2019-06-20 MED ORDER — ONDANSETRON HCL 4 MG/2ML IJ SOLN
4.0000 mg | Freq: Once | INTRAMUSCULAR | Status: AC
  Administered 2019-06-20: 4 mg via INTRAVENOUS
  Filled 2019-06-20: qty 2

## 2019-06-20 MED ORDER — CIPROFLOXACIN IN D5W 400 MG/200ML IV SOLN: 400.0000 mg | Freq: Once | INTRAVENOUS | Status: DC

## 2019-06-20 MED ORDER — ACETAMINOPHEN 325 MG PO TABS: 650.0000 mg | ORAL_TABLET | Freq: Four times a day (QID) | ORAL | Status: DC | PRN

## 2019-06-20 MED ORDER — TRAZODONE HCL 50 MG PO TABS: 50.0000 mg | ORAL_TABLET | Freq: Every evening | ORAL | Status: DC | PRN

## 2019-06-20 MED ORDER — FENTANYL CITRATE (PF) 100 MCG/2ML IJ SOLN
50.0000 ug | Freq: Once | INTRAMUSCULAR | Status: AC
  Administered 2019-06-20: 50 ug via INTRAVENOUS
  Filled 2019-06-20: qty 2

## 2019-06-20 MED ORDER — ONDANSETRON HCL 4 MG PO TABS: 4.0000 mg | ORAL_TABLET | Freq: Four times a day (QID) | ORAL | Status: DC | PRN

## 2019-06-20 MED ORDER — SODIUM CHLORIDE 0.9% FLUSH
3.0000 mL | Freq: Two times a day (BID) | INTRAVENOUS | Status: DC
  Administered 2019-06-21 – 2019-06-22 (×3): 3 mL via INTRAVENOUS

## 2019-06-20 MED ORDER — SIMVASTATIN 20 MG PO TABS
20.0000 mg | ORAL_TABLET | Freq: Every day | ORAL | Status: DC
  Administered 2019-06-20 – 2019-06-21 (×2): 20 mg via ORAL
  Filled 2019-06-20: qty 1
  Filled 2019-06-20: qty 2

## 2019-06-20 MED ORDER — SODIUM CHLORIDE 0.9 % IV SOLN
2.0000 g | INTRAVENOUS | Status: DC
  Administered 2019-06-20 – 2019-06-22 (×3): 2 g via INTRAVENOUS
  Filled 2019-06-20 (×3): qty 20

## 2019-06-20 NOTE — ED Triage Notes (Signed)
Pt here c/o severe abd pain that woke her from her sleep tonight.
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
## 2019-06-20 NOTE — ED Notes (Signed)
Pt taken to CT at this time.

## 2019-06-20 NOTE — Plan of Care (Signed)
  Problem: Education: Goal: Knowledge of General Education information will improve Description: Including pain rating scale, medication(s)/side effects and non-pharmacologic comfort measures Outcome: Progressing   Problem: Clinical Measurements: Goal: Ability to maintain clinical measurements within normal limits will improve Outcome: Progressing   Problem: Clinical Measurements: Goal: Will remain free from infection Outcome: Progressing   Problem: Clinical Measurements: Goal: Respiratory complications will improve Outcome: Progressing   Problem: Clinical Measurements: Goal: Cardiovascular complication will be avoided Outcome: Progressing   Problem: Activity: Goal: Risk for activity intolerance will decrease Outcome: Progressing   Problem: Elimination: Goal: Will not experience complications related to bowel motility Outcome: Progressing   Problem: Coping: Goal: Level of anxiety will decrease Outcome: Progressing   Problem: Safety: Goal: Ability to remain free from injury will improve Outcome: Progressing   Problem: Skin Integrity: Goal: Risk for impaired skin integrity will decrease Outcome: Progressing

## 2019-06-21 ENCOUNTER — Encounter (HOSPITAL_COMMUNITY): Payer: Self-pay | Admitting: Family Medicine

## 2019-06-21 DIAGNOSIS — K921 Melena: Secondary | ICD-10-CM

## 2019-06-21 DIAGNOSIS — N184 Chronic kidney disease, stage 4 (severe): Secondary | ICD-10-CM

## 2019-06-21 DIAGNOSIS — K5792 Diverticulitis of intestine, part unspecified, without perforation or abscess without bleeding: Secondary | ICD-10-CM

## 2019-06-21 DIAGNOSIS — D649 Anemia, unspecified: Secondary | ICD-10-CM

## 2019-06-21 DIAGNOSIS — I495 Sick sinus syndrome: Secondary | ICD-10-CM

## 2019-06-21 DIAGNOSIS — I10 Essential (primary) hypertension: Secondary | ICD-10-CM

## 2019-06-21 LAB — CBC
HCT: 37.5 % (ref 36.0–46.0)
Hemoglobin: 11.7 g/dL — ABNORMAL LOW (ref 12.0–15.0)
MCH: 29.5 pg (ref 26.0–34.0)
MCHC: 31.2 g/dL (ref 30.0–36.0)
MCV: 94.5 fL (ref 80.0–100.0)
Platelets: 116 10*3/uL — ABNORMAL LOW (ref 150–400)
RBC: 3.97 MIL/uL (ref 3.87–5.11)
RDW: 13.4 % (ref 11.5–15.5)
WBC: 13.1 10*3/uL — ABNORMAL HIGH (ref 4.0–10.5)
nRBC: 0 % (ref 0.0–0.2)

## 2019-06-21 LAB — BASIC METABOLIC PANEL
Anion gap: 7 (ref 5–15)
BUN: 31 mg/dL — ABNORMAL HIGH (ref 8–23)
CO2: 24 mmol/L (ref 22–32)
Calcium: 8.2 mg/dL — ABNORMAL LOW (ref 8.9–10.3)
Chloride: 111 mmol/L (ref 98–111)
Creatinine, Ser: 2.22 mg/dL — ABNORMAL HIGH (ref 0.44–1.00)
GFR calc Af Amer: 23 mL/min — ABNORMAL LOW (ref >60)
GFR calc non Af Amer: 20 mL/min — ABNORMAL LOW (ref >60)
Glucose, Bld: 101 mg/dL — ABNORMAL HIGH (ref 70–99)
Potassium: 4.1 mmol/L (ref 3.5–5.1)
Sodium: 142 mmol/L (ref 135–145)

## 2019-06-21 NOTE — Progress Notes (Signed)
Patient Demographics:    Ashley Olsen, is a 86 y.o. female, DOB - 11/13/1932, ZJQ:[id number]  Admit date - 06/20/2019   Admitting Physician Ruchy Wildrick Denton Brick, MD  Outpatient Primary MD for the patient is Celene Squibb, MD  LOS - 1   Chief Complaint  Patient presents with   Abdominal Pain        Subjective:    Ashley Olsen today has no fevers, no emesis,  No chest pain,   --Some nausea -Abdominal pain persist but is not worse -Some blood in BM  Assessment  & Plan :    Principal Problem:   Acute diverticulitis of intestine Active Problems:   PPM-St.Jude   CKD (chronic kidney disease) stage 4, GFR 15-29 ml/min (HCC)   TOBACCO ABUSE   Essential hypertension   SICK SINUS/ TACHY-BRADY SYNDROME   Mild cognitive impairment, so stated   Acute diverticulitis   Hematochezia   Anemia   1)Acute Diverticulitis--abdominal pain and nausea noted,  -Patient is up to 13.1 from 10.8  --continue Rocephin and Flagyl, -Advance to full liquid diet -antiemetics and opiates as needed -Patient had episode of rectal bleeding, - query some degree of diverticular bleed with concomitant acute diverticulitis as well -Monitor H&H and transfuse as clinically indicated -Consider GI consult appreciated - 2)History of sick sinus syndrome--status post pacemaker placement  3)AKI on  CKD stage - IV--- worsening renal function due to diverticulitis and dehydration, ---   creatinine on admission= 2.59, baseline creatinine 1.7 to 1.8 =    , -Creatinine down to 2.2 , renally adjust medications, avoid nephrotoxic agents / dehydration  / hypotension -Hold lisinopril  4_HTN--- hold lisinopril as above #1, continue amlodipine  5) dementia with mild cognitive deficits--continue Namenda  6)Possible UTI----IV Rocephin as above #1 pending culture  6)HLD--continue simvastatin  7)Social/Ethics--plan of care  discussed with patient and daughter at bedside, patient is a full code  8)ABLA--hemoglobin is down to 11.7 from 13.5, suspect due to a diverticular bleed -GI consult appreciated, monitor closely and transfuse as clinically indicated  Disposition/Need for in-Hospital Stay- patient unable to be discharged at this time due to --acute diverticulitis with bleeding requiring IV antibiotics and serial H&H* =--WBC trending up continue IV antibiotics, needs further iv hydration due to AKI on CKD   Status is: Inpatient  Remains inpatient appropriate because:IV treatments appropriate due to intensity of illness or inability to take PO and Inpatient level of care appropriate due to severity of illness   Disposition: The patient is from: Home              Anticipated d/c is to: Home              Anticipated d/c date is: 1 day              Patient currently is not medically stable to d/c. Barriers: Not Clinically Stable- -WBC trending up continue IV antibiotics, needs further iv hydration due to AKI on CKD  Code Status : full  Family Communication:  (patient is alert, awake and coherent)   Consults  :  Gi  DVT Prophylaxis  :    - SCDs   Lab Results  Component Value Date   PLT 116 (L) 06/21/2019  Inpatient Medications  Scheduled Meds:  amLODipine  5 mg Oral Daily   memantine  5 mg Oral Daily   multivitamin with minerals  1 tablet Oral Daily   neomycin-polymyxin b-dexamethasone  2 drop Right Eye Q6H   simvastatin  20 mg Oral q1800   sodium chloride flush  3 mL Intravenous Q12H   Continuous Infusions:  sodium chloride 50 mL/hr at 06/21/19 0515   sodium chloride     cefTRIAXone (ROCEPHIN)  IV 2 g (06/21/19 1329)   metronidazole 500 mg (06/21/19 1458)   PRN Meds:.sodium chloride, acetaminophen **OR** acetaminophen, albuterol, fentaNYL (SUBLIMAZE) injection, ondansetron **OR** ondansetron (ZOFRAN) IV, oxyCODONE, polyethylene glycol, sodium chloride flush,  traZODone    Anti-infectives (From admission, onward)   Start     Dose/Rate Route Frequency Ordered Stop   06/20/19 1130  cefTRIAXone (ROCEPHIN) 2 g in sodium chloride 0.9 % 100 mL IVPB     Discontinue     2 g 200 mL/hr over 30 Minutes Intravenous Every 24 hours 06/20/19 1107     06/20/19 1115  metroNIDAZOLE (FLAGYL) IVPB 500 mg     Discontinue     500 mg 100 mL/hr over 60 Minutes Intravenous Every 8 hours 06/20/19 1107     06/20/19 1100  ciprofloxacin (CIPRO) IVPB 400 mg  Status:  Discontinued        400 mg 200 mL/hr over 60 Minutes Intravenous  Once 06/20/19 1057 06/20/19 1109   06/20/19 1100  metroNIDAZOLE (FLAGYL) tablet 500 mg  Status:  Discontinued        500 mg Oral  Once 06/20/19 1057 06/20/19 1110        Objective:   Vitals:   06/21/19 0401 06/21/19 0800 06/21/19 1334 06/21/19 1449  BP: (!) 109/41 (!) 125/56  (!) 114/57  Pulse: (!) 59 (!) 59 63 66  Resp: 18 18 17 19   Temp: 98.4 F (36.9 C) 98 F (36.7 C)  98.4 F (36.9 C)  TempSrc: Oral Oral  Oral  SpO2: 93% 94% 94% 95%  Weight:      Height:        Wt Readings from Last 3 Encounters:  06/20/19 68 kg  04/04/19 68.5 kg  06/28/18 65.8 kg     Intake/Output Summary (Last 24 hours) at 06/21/2019 1936 Last data filed at 06/21/2019 0515 Gross per 24 hour  Intake 1091.33 ml  Output 350 ml  Net 741.33 ml     Physical Exam  Gen:- Awake Alert,  In no apparent distress  HEENT:- [email or web address].AT, No sclera icterus Neck-Supple Neck,No JVD,.  Lungs-  CTAB , fair symmetrical air movement CV- S1, S2 normal, regular  Abd-  +ve B.Sounds, Abd Soft, lower abdominal tenderness,   no CVA tenderness  \extremity/Skin:- No  edema, pedal pulses present  Psych-mild cognitive deficits Neuro-no new focal deficits, no tremors   Data Review:   Micro Results Recent Results (from the past 240 hour(s))  SARS Coronavirus 2 by RT PCR (hospital order, performed in [facility] hospital lab) Nasopharyngeal Nasopharyngeal Swab     Status: None    Collection Time: 06/20/19 12:20 PM   Specimen: Nasopharyngeal Swab  Result Value Ref Range Status   SARS Coronavirus 2 NEGATIVE NEGATIVE Final    Comment: (NOTE) SARS-CoV-2 target nucleic acids are NOT DETECTED. The SARS-CoV-2 RNA is generally detectable in upper and lower respiratory specimens during the acute phase of infection. The lowest concentration of SARS-CoV-2 viral copies this assay can detect is 250 copies / mL. A negative  result does not preclude SARS-CoV-2 infection and should not be used as the sole basis for treatment or other patient management decisions.  A negative result may occur with improper specimen collection / handling, submission of specimen other than nasopharyngeal swab, presence of viral mutation(s) within the areas targeted by this assay, and inadequate number of viral copies (<250 copies / mL). A negative result must be combined with clinical observations, patient history, and epidemiological information. Fact Sheet for Patients:   StrictlyIdeas.no Fact Sheet for Healthcare Providers: BankingDealers.co.za This test is not yet approved or cleared  by the [place] FDA and has been authorized for detection and/or diagnosis of SARS-CoV-2 by FDA under an Emergency Use Authorization (EUA).  This EUA will remain in effect (meaning this test can be used) for the duration of the COVID-19 declaration under Section 564(b)(1) of the Act, 21 U.S.C. section 360bbb-3(b)(1), unless the authorization is terminated or revoked sooner. Performed at [facility], [address], [place], [place] [address]     Radiology Reports CT Abdomen Pelvis Wo Contrast  Result Date: 06/20/2019 CLINICAL DATA:  Abdominal pain with nausea and vomiting EXAM: CT ABDOMEN AND PELVIS WITHOUT CONTRAST TECHNIQUE: Multidetector CT imaging of the abdomen and pelvis was performed following the standard protocol without IV contrast. There is oral  contrast present. COMPARISON:  Jun 03, 2018. FINDINGS: Lower chest: There is scarring in the lung bases, more notable on the right than on the left. There is mild lower lobe bronchiectatic change. There are multiple foci of aortic atherosclerosis. Pacemaker leads are attached to the right heart. There is a small hiatal hernia. Hepatobiliary: There is a stable apparent cystic area in the inferior aspect of the right lobe of the liver posteriorly measuring 2.8 x 2.8 cm. A cyst in the anterior segment of the right lobe of the liver toward the dome measures 1.0 x 0.9 cm. There is a 7 mm apparent cyst in the left lobe of the liver. Scattered tiny presumed cysts elsewhere appear stable within the liver on this noncontrast enhanced study. The gallbladder wall is not appreciably thickened. There is no evident biliary duct dilatation. Pancreas: There is no pancreatic mass or inflammatory focus. Spleen: No splenic lesions are evident. Adrenals/Urinary Tract: The left adrenal appears normal. There is a stable apparent adenoma in the right adrenal measuring 3.3 x 2.4 cm. There are stable apparent cysts arising in each kidney. Largest cyst on the left is seen in the posterior upper pole region measuring 2.7 x 2.6 cm. Cysts on the right measure as large as 3.2 x 2.9 cm. This appearance is stable. No evident hydronephrosis on either side. No appreciable renal or ureteral calculus is evident on either side. Urinary bladder is midline with wall thickness within normal limits. Stomach/Bowel: Postoperative changes noted in the sigmoid colon region with patent anastomosis. There is inflammation involving the mid to distal sigmoid colon with wall thickening and mild pericolonic soft tissue stranding in this area, felt to represent a degree of localized mid to distal sigmoid diverticulitis. The previously noted diverticulitis more proximally in the sigmoid colon has resolved. Borderline wall thickening in this area may represent muscular  hypertrophy from chronic diverticulosis. There are scattered diverticula in the sigmoid colon as well without inflammation. There is no evident bowel obstruction. The terminal ileum appears normal. There is lipomatous infiltration in the ileocecal valve. No evident free air or portal venous air. Vascular/Lymphatic: There is no abdominal aortic aneurysm. There is aortic and iliac artery atherosclerosis at multiple  sites. No adenopathy is appreciable in the abdomen or pelvis. Reproductive: Uterus is absent.  There is no evident pelvic mass. Other: No abscess or ascites is evident in the abdomen or pelvis. There is no periappendiceal region inflammatory change. There is minimal fat in the umbilicus. Musculoskeletal: No blastic or lytic bone lesions. No intramuscular lesions. IMPRESSION: 1. Localized diverticulitis in the distal sigmoid colon without abscess or perforation evident. Nearby postoperative change with patent anastomosis. Scattered areas of descending colon and sigmoid diverticulosis elsewhere without inflammation in these areas. 2.  No bowel obstruction.  No abscess in the abdomen or pelvis. 3.  Small hiatal hernia. 4. No renal or ureteral calculus. No hydronephrosis. Urinary bladder wall thickness normal. 5.  Stable right adrenal adenoma. 6. Aortic Atherosclerosis (ICD10-I70.0). Foci of coronary artery and pelvic arterial vascular calcification noted. 7.  Uterus absent. Electronically Signed   By: Lowella Grip III M.D.   On: 06/20/2019 09:41   DG Chest 2 View  Result Date: 06/20/2019 CLINICAL DATA:  Cough, smoker, COPD EXAM: CHEST - 2 VIEW COMPARISON:  CT 02/11/2017, radiograph 03/03/2017 FINDINGS: Mild hyperinflation and flattening of the diaphragms with chronically coarsened interstitial and bronchitic changes more pronounced in the lung bases. No consolidation, features of edema, pneumothorax, or effusion. Pulmonary vascularity is normally distributed. The aorta is calcified. The remaining  cardiomediastinal contours are unremarkable. Pacer pack overlies the right chest wall with leads at the right atrium and cardiac apex more remote abandoned leads are also positioned in these locations as well. No acute osseous or soft tissue abnormality. Multilevel degenerative changes are present in the imaged portions of the spine. Telemetry leads overlie the chest. IMPRESSION: 1. No acute cardiopulmonary abnormality. 2. Hyperinflation with chronic interstitial and bronchitic changes more pronounced in the lung bases. Findings compatible with smoking related changes/COPD. Electronically Signed   By: Lovena Le M.D.   On: 06/20/2019 06:05     CBC Recent Labs  Lab 06/20/19 0411 06/21/19 0458  WBC 10.8* 13.1*  HGB 13.5 11.7*  HCT 43.1 37.5  PLT 156 116*  MCV 96.2 94.5  MCH 30.1 29.5  MCHC 31.3 31.2  RDW 13.5 13.4    Chemistries  Recent Labs  Lab 06/20/19 0411 06/21/19 0458  NA 144 142  K 4.2 4.1  CL 106 111  CO2 26 24  GLUCOSE 147* 101*  BUN 34* 31*  CREATININE 2.59* 2.22*  CALCIUM 9.3 8.2*  AST 21  --   ALT 15  --   ALKPHOS 75  --   BILITOT 0.4  --    ------------------------------------------------------------------------------------------------------------------ No results for input(s): CHOL, HDL, LDLCALC, TRIG, CHOLHDL, LDLDIRECT in the last 72 hours.  No results found for: HGBA1C ------------------------------------------------------------------------------------------------------------------ No results for input(s): TSH, T4TOTAL, T3FREE, THYROIDAB in the last 72 hours.  Invalid input(s): FREET3 ------------------------------------------------------------------------------------------------------------------ No results for input(s): VITAMINB12, FOLATE, FERRITIN, TIBC, IRON, RETICCTPCT in the last 72 hours.  Coagulation profile No results for input(s): INR, PROTIME in the last 168 hours.  No results for input(s): DDIMER in the last 72 hours.  Cardiac  Enzymes No results for input(s): CKMB, TROPONINI, MYOGLOBIN in the last 168 hours.  Invalid input(s): CK ------------------------------------------------------------------------------------------------------------------    Component Value Date/Time   BNP 33.0 01/14/2015 1546   BNP 201.7 (H) 06/16/2011 1348     Ashley Olsen M.D on 06/21/2019 at 7:36 PM  Go to [email or web address] - for contact info  [facility] - Office  [phone]
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
## 2019-06-22 ENCOUNTER — Telehealth: Payer: Self-pay | Admitting: Nurse Practitioner

## 2019-06-22 DIAGNOSIS — Z950 Presence of cardiac pacemaker: Secondary | ICD-10-CM

## 2019-06-22 LAB — BASIC METABOLIC PANEL
Anion gap: 8 (ref 5–15)
BUN: 26 mg/dL — ABNORMAL HIGH (ref 8–23)
CO2: 23 mmol/L (ref 22–32)
Calcium: 8.2 mg/dL — ABNORMAL LOW (ref 8.9–10.3)
Chloride: 110 mmol/L (ref 98–111)
Creatinine, Ser: 2.21 mg/dL — ABNORMAL HIGH (ref 0.44–1.00)
GFR calc Af Amer: 23 mL/min — ABNORMAL LOW (ref >60)
GFR calc non Af Amer: 20 mL/min — ABNORMAL LOW (ref >60)
Glucose, Bld: 89 mg/dL (ref 70–99)
Potassium: 3.9 mmol/L (ref 3.5–5.1)
Sodium: 141 mmol/L (ref 135–145)

## 2019-06-22 LAB — CBC
HCT: 36 % (ref 36.0–46.0)
Hemoglobin: 11.4 g/dL — ABNORMAL LOW (ref 12.0–15.0)
MCH: 30.3 pg (ref 26.0–34.0)
MCHC: 31.7 g/dL (ref 30.0–36.0)
MCV: 95.7 fL (ref 80.0–100.0)
Platelets: 108 10*3/uL — ABNORMAL LOW (ref 150–400)
RBC: 3.76 MIL/uL — ABNORMAL LOW (ref 3.87–5.11)
RDW: 13.5 % (ref 11.5–15.5)
WBC: 11.6 10*3/uL — ABNORMAL HIGH (ref 4.0–10.5)
nRBC: 0 % (ref 0.0–0.2)

## 2019-06-22 MED ORDER — SIMVASTATIN 20 MG PO TABS: 20.0000 mg | ORAL_TABLET | Freq: Every day | ORAL | 1 refills | Status: AC

## 2019-06-22 MED ORDER — CEFDINIR 300 MG PO CAPS
300.0000 mg | ORAL_CAPSULE | Freq: Every day | ORAL | 0 refills | Status: AC
Start: 2019-06-22 — End: 2019-06-29

## 2019-06-22 MED ORDER — METRONIDAZOLE 500 MG PO TABS
500.0000 mg | ORAL_TABLET | Freq: Three times a day (TID) | ORAL | 0 refills | Status: AC
Start: 2019-06-22 — End: 2019-06-29

## 2019-06-22 MED ORDER — ACETAMINOPHEN 325 MG PO TABS: 650.0000 mg | ORAL_TABLET | Freq: Four times a day (QID) | ORAL | 0 refills | Status: AC | PRN

## 2019-06-22 MED ORDER — AMLODIPINE BESYLATE 10 MG PO TABS: 10.0000 mg | ORAL_TABLET | Freq: Every day | ORAL | 3 refills | Status: DC

## 2019-06-22 NOTE — Care Management Important Message (Signed)
Important Message  Patient Details  Name: Ashley Olsen MRN: [id number] Date of Birth: Nov 07, 1932   Medicare Important Message Given:  Yes     Ashley Olsen 06/22/2019, 1:15 PM

## 2019-06-22 NOTE — Progress Notes (Signed)
Subjective: No further abdominal pain since yesterday late afternoon. No N/V today. Some mild LLQ abdominal soreness. No other GI complaints.  Objective: Vital signs in last 24 hours: Temp:  [98.4 F (36.9 C)-100 F (37.8 C)] 98.8 F (37.1 C) (06/11 0511) Pulse Rate:  [59-66] 66 (06/11 0511) Resp:  [15-19] 15 (06/11 0511) BP: (114-130)/(54-57) 130/56 (06/11 0511) SpO2:  [94 %-95 %] 94 % (06/11 0511) Last BM Date: 06/20/19 General:   Alert and oriented, pleasant Head:  Normocephalic and atraumatic. Eyes:  No icterus, sclera clear. Conjuctiva pink.  Heart:  S1, S2 present, no murmurs noted.  Lungs: Clear to auscultation bilaterally, without wheezing, rales, or rhonchi.  Abdomen:  Bowel sounds present, soft, non-tender, non-distended. No HSM or hernias noted. No rebound or guarding. No masses appreciated  Msk:  Symmetrical without gross deformities. Pulses:  Normal bilateral DP pulses noted. Extremities:  Without clubbing or edema. Neurologic:  Alert and  oriented x4;  grossly normal neurologically. Psych:  Alert and cooperative. Normal mood and affect.  Intake/Output from previous day: 06/10 0701 - 06/11 0700 In: 150 [P.O.:150] Out: 750 [Urine:750] Intake/Output this shift: No intake/output data recorded.  Lab Results: Recent Labs    06/20/19 0411 06/21/19 0458  WBC 10.8* 13.1*  HGB 13.5 11.7*  HCT 43.1 37.5  PLT 156 116*   BMET Recent Labs    06/20/19 0411 06/21/19 0458  NA 144 142  K 4.2 4.1  CL 106 111  CO2 26 24  GLUCOSE 147* 101*  BUN 34* 31*  CREATININE 2.59* 2.22*  CALCIUM 9.3 8.2*   LFT Recent Labs    06/20/19 0411  PROT 7.0  ALBUMIN 3.9  AST 21  ALT 15  ALKPHOS 75  BILITOT 0.4   PT/INR No results for input(s): LABPROT, INR in the last 72 hours. Hepatitis Panel No results for input(s): HEPBSAG, HCVAB, HEPAIGM, HEPBIGM in the last 72 hours.   Studies/Results: CT Abdomen Pelvis Wo Contrast  Result Date: 06/20/2019 CLINICAL DATA:   Abdominal pain with nausea and vomiting EXAM: CT ABDOMEN AND PELVIS WITHOUT CONTRAST TECHNIQUE: Multidetector CT imaging of the abdomen and pelvis was performed following the standard protocol without IV contrast. There is oral contrast present. COMPARISON:  Jun 03, 2018. FINDINGS: Lower chest: There is scarring in the lung bases, more notable on the right than on the left. There is mild lower lobe bronchiectatic change. There are multiple foci of aortic atherosclerosis. Pacemaker leads are attached to the right heart. There is a small hiatal hernia. Hepatobiliary: There is a stable apparent cystic area in the inferior aspect of the right lobe of the liver posteriorly measuring 2.8 x 2.8 cm. A cyst in the anterior segment of the right lobe of the liver toward the dome measures 1.0 x 0.9 cm. There is a 7 mm apparent cyst in the left lobe of the liver. Scattered tiny presumed cysts elsewhere appear stable within the liver on this noncontrast enhanced study. The gallbladder wall is not appreciably thickened. There is no evident biliary duct dilatation. Pancreas: There is no pancreatic mass or inflammatory focus. Spleen: No splenic lesions are evident. Adrenals/Urinary Tract: The left adrenal appears normal. There is a stable apparent adenoma in the right adrenal measuring 3.3 x 2.4 cm. There are stable apparent cysts arising in each kidney. Largest cyst on the left is seen in the posterior upper pole region measuring 2.7 x 2.6 cm. Cysts on the right measure as large as 3.2 x 2.9 cm. This  appearance is stable. No evident hydronephrosis on either side. No appreciable renal or ureteral calculus is evident on either side. Urinary bladder is midline with wall thickness within normal limits. Stomach/Bowel: Postoperative changes noted in the sigmoid colon region with patent anastomosis. There is inflammation involving the mid to distal sigmoid colon with wall thickening and mild pericolonic soft tissue stranding in this area,  felt to represent a degree of localized mid to distal sigmoid diverticulitis. The previously noted diverticulitis more proximally in the sigmoid colon has resolved. Borderline wall thickening in this area may represent muscular hypertrophy from chronic diverticulosis. There are scattered diverticula in the sigmoid colon as well without inflammation. There is no evident bowel obstruction. The terminal ileum appears normal. There is lipomatous infiltration in the ileocecal valve. No evident free air or portal venous air. Vascular/Lymphatic: There is no abdominal aortic aneurysm. There is aortic and iliac artery atherosclerosis at multiple sites. No adenopathy is appreciable in the abdomen or pelvis. Reproductive: Uterus is absent.  There is no evident pelvic mass. Other: No abscess or ascites is evident in the abdomen or pelvis. There is no periappendiceal region inflammatory change. There is minimal fat in the umbilicus. Musculoskeletal: No blastic or lytic bone lesions. No intramuscular lesions. IMPRESSION: 1. Localized diverticulitis in the distal sigmoid colon without abscess or perforation evident. Nearby postoperative change with patent anastomosis. Scattered areas of descending colon and sigmoid diverticulosis elsewhere without inflammation in these areas. 2.  No bowel obstruction.  No abscess in the abdomen or pelvis. 3.  Small hiatal hernia. 4. No renal or ureteral calculus. No hydronephrosis. Urinary bladder wall thickness normal. 5.  Stable right adrenal adenoma. 6. Aortic Atherosclerosis (ICD10-I70.0). Foci of coronary artery and pelvic arterial vascular calcification noted. 7.  Uterus absent. Electronically Signed   By: Lowella Grip III M.D.   On: 06/20/2019 09:41    Assessment: Very pleasant 86 year old female with a history of diverticulitis status post partial colon resection in 2007 who presented with abdominal pain, nausea, vomiting.  In the emergency room she was found to be heme positive.   CT scan shows scattered diverticulosis in the descending and sigmoid colon's.  Anastomosis appears patent, no obvious bowel obstruction.  Previous area of proximal sigmoid diverticulitis resolved but with some wall thickening likely muscular hypertrophy in the setting of chronic diverticulosis.  There is a new area of more distal sigmoid acute diverticulitis without abscess or perforation.  Stable liver cysts.  No other concerning GI findings.  In the emergency department it was noted she had blood in her stool and was found to be heme positive.  She was admitted for further work-up and management.  Anemia -historically the patient appears to have normal hemoglobins over the past couple years with a baseline around 13-13.5.  Yesterday it was noted she had blood in her stool and was heme positive.  Her hemoglobin decreased from 13.5-11.7 yesterday.  She denies hematochezia or melena prior to presentation, platelet count intermittently mildly low.  Yesterday it decreased from 156 to 116 and query element of hydration effect.  Colonoscopy last completed in 2015 and currently due, pending risks versus benefits.  Differentials include benign anorectal source, bleeding polyp, diverticular bleed unrelated to acute diverticulitis.  Less likely malignant process.  She would likely benefit from an outpatient colonoscopy, although generally not a candidate currently given her colon inflammation with acute diverticulitis.  Labs this morning show stable hgb (11.7 -> 11.4). Plt count stable (116 -> 108).  Acute diverticulitis -scattered  diverticular disease in the descending and sigmoid colon with a history of diverticulitis status post partial colectomy in 2007 with CT findings as outlined above.  Left lower quadrant abdominal pain and leukocytosis consistent with acute diverticulitis, remains on Cipro and Flagyl.  Her pain is intermittent and typically brief. Will likely benefit from outpatient colonoscopy. Pain today  significantly improved, only some mild residual soreness.  No overt pain since yesterday afternoon.   Plan: 1. Continue antibiotics at home (Cipro 500 mg bid and Flagyl 500 mg tid for 10 total days) 2. Monitor for more acute GI bleed 3. Transfuse as necessary 4. Plan outpatient colonoscopy 2-3 months 5. Supportive measures 6. Anticipate d/c in the next 24 hours, will arrange outpatient GI follow-up   Thank you for allowing [place] to participate in the care of [place], DNP, AGNP-C Adult & Gerontological Nurse Practitioner [facility]    LOS: 2 days    06/22/2019, 8:18 AM

## 2019-06-22 NOTE — Discharge Instructions (Signed)
1)Avoid ibuprofen/Advil/Aleve/Motrin/Goody Powders/Naproxen/BC powders/Meloxicam/Diclofenac/Indomethacin and other Nonsteroidal anti-inflammatory medications as these will make you more likely to bleed and can cause stomach ulcers, can also cause Kidney problems.   2) soft foods advised  3) follow-up with your primary care physician in about a week for recheck and reevaluation--- you will need repeat BMP and CBC test in about a week or so

## 2019-06-22 NOTE — Telephone Encounter (Signed)
Please arrange hospital follow-up for 4-6 weeks, any APP.

## 2019-06-22 NOTE — Telephone Encounter (Signed)
OV made and appt card mailed °
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
## 2019-06-26 DIAGNOSIS — I129 Hypertensive chronic kidney disease with stage 1 through stage 4 chronic kidney disease, or unspecified chronic kidney disease: Secondary | ICD-10-CM | POA: Diagnosis not present

## 2019-06-26 DIAGNOSIS — R531 Weakness: Secondary | ICD-10-CM | POA: Diagnosis not present

## 2019-06-26 DIAGNOSIS — N184 Chronic kidney disease, stage 4 (severe): Secondary | ICD-10-CM | POA: Diagnosis not present

## 2019-06-26 DIAGNOSIS — E785 Hyperlipidemia, unspecified: Secondary | ICD-10-CM | POA: Diagnosis not present

## 2019-06-26 DIAGNOSIS — G3184 Mild cognitive impairment, so stated: Secondary | ICD-10-CM | POA: Diagnosis not present

## 2019-06-26 DIAGNOSIS — R110 Nausea: Secondary | ICD-10-CM | POA: Diagnosis not present

## 2019-06-26 DIAGNOSIS — K5793 Diverticulitis of intestine, part unspecified, without perforation or abscess with bleeding: Secondary | ICD-10-CM | POA: Diagnosis not present

## 2019-07-10 ENCOUNTER — Ambulatory Visit (INDEPENDENT_AMBULATORY_CARE_PROVIDER_SITE_OTHER): Payer: PPO | Admitting: *Deleted

## 2019-07-10 DIAGNOSIS — R001 Bradycardia, unspecified: Secondary | ICD-10-CM | POA: Diagnosis not present

## 2019-07-10 LAB — CUP PACEART REMOTE DEVICE CHECK
Battery Remaining Longevity: 109 mo
Battery Remaining Percentage: 95.5 %
Battery Voltage: 2.99 V
Brady Statistic AP VP Percent: 1.6 %
Brady Statistic AP VS Percent: 81 %
Brady Statistic AS VP Percent: 1 %
Brady Statistic AS VS Percent: 17 %
Brady Statistic RA Percent Paced: 82 %
Brady Statistic RV Percent Paced: 1.6 %
Date Time Interrogation Session: 20210629035754
Implantable Lead Implant Date: 20161013
Implantable Lead Implant Date: 20161013
Implantable Lead Location: 753859
Implantable Lead Location: 753860
Implantable Pulse Generator Implant Date: 20161013
Lead Channel Impedance Value: 360 Ohm
Lead Channel Impedance Value: 410 Ohm
Lead Channel Pacing Threshold Amplitude: 0.75 V
Lead Channel Pacing Threshold Amplitude: 0.75 V
Lead Channel Pacing Threshold Pulse Width: 0.5 ms
Lead Channel Pacing Threshold Pulse Width: 0.5 ms
Lead Channel Sensing Intrinsic Amplitude: 1.5 mV
Lead Channel Sensing Intrinsic Amplitude: 12 mV
Lead Channel Setting Pacing Amplitude: 2 V
Lead Channel Setting Pacing Amplitude: 2.5 V
Lead Channel Setting Pacing Pulse Width: 0.5 ms
Lead Channel Setting Sensing Sensitivity: 2 mV
Pulse Gen Model: 2240
Pulse Gen Serial Number: 7820884

## 2019-07-11 NOTE — Progress Notes (Signed)
Remote pacemaker transmission.   

## 2019-08-01 ENCOUNTER — Ambulatory Visit: Payer: PPO | Admitting: Urology
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
## 2019-08-09 ENCOUNTER — Ambulatory Visit (INDEPENDENT_AMBULATORY_CARE_PROVIDER_SITE_OTHER): Payer: PPO | Admitting: Gastroenterology

## 2019-08-09 ENCOUNTER — Encounter: Payer: Self-pay | Admitting: Gastroenterology

## 2019-08-09 ENCOUNTER — Other Ambulatory Visit: Payer: Self-pay

## 2019-08-09 VITALS — BP 137/79 | HR 76 | Temp 97.3°F | Ht 62.0 in | Wt 148.6 lb

## 2019-08-09 DIAGNOSIS — K625 Hemorrhage of anus and rectum: Secondary | ICD-10-CM

## 2019-08-09 DIAGNOSIS — R110 Nausea: Secondary | ICD-10-CM

## 2019-08-09 DIAGNOSIS — Z8719 Personal history of other diseases of the digestive system: Secondary | ICD-10-CM

## 2019-08-09 DIAGNOSIS — D649 Anemia, unspecified: Secondary | ICD-10-CM | POA: Diagnosis not present

## 2019-08-09 HISTORY — DX: Hemorrhage of anus and rectum: K62.5

## 2019-08-09 MED ORDER — ONDANSETRON HCL 4 MG PO TABS: 4.0000 mg | ORAL_TABLET | Freq: Three times a day (TID) | ORAL | 0 refills | Status: DC | PRN

## 2019-08-09 NOTE — Assessment & Plan Note (Addendum)
86 year old female with history of diverticulitis who underwent partial colectomy in 2007.  She had episode of diverticulitis in May 2020 and was recently hospitalized in June 2021 for acute uncomplicated diverticulitis.  Patient's daughter reports patient had one bloody stools in the emergency department which staff did not see as she helped her mother to the bathroom.  She does have documented heme positive stool on 06/20/2019.  Notably, hemoglobin did drift from 13.5 down to 11.4 during admission but this was suspected to be secondary to hydration effect.  She has not had any recurrent rectal bleeding.  Denies melena.  Abdominal pain has completely resolved. No other significant upper or lower GI symptoms.   We typically do not see rectal bleeding in the setting of diverticulitis.  Is possible that she had bleeding from a second third diverticulum not associated with the area of the colon acutely inflamed.  We will proceed with colonoscopy for further evaluation of recurrent diverticulitis, rectal bleeding, and heme positive stool.  Plan: Update CBC. Proceed with colonoscopy with propofol with Dr. Abbey Chatters in the near future. The risks, benefits, and alternatives have been discussed in detail with patient. They have stated understanding and desire to proceed.  ASA III- notably, patient has pacemaker. Prescription for Zofran 4 mg every 8 hours as needed for nausea on the day of colon prep sent to pharmacy. Add Benefiber or Metamucil daily. Follow-up as needed after colonoscopy or as Dr. Abbey Chatters recommends.

## 2019-08-09 NOTE — Assessment & Plan Note (Signed)
86 year old female with history of partial colectomy in 2007 secondary to diverticulitis.  She had diverticulitis in May 2020 and more recently hospitalized in June 2021 for acute uncomplicated diverticulitis in the distal sigmoid colon.  She was found to be heme positive in the emergency room and patient's daughter reports her mother had 1 bloody bowel movement in the emergency room.  Notably, hemoglobin did drift down from 13.5-11.4 during admission. Patient denies any recurrent rectal bleeding or black stool.  She completed antibiotics and abdominal pain has completely resolved.  Bowel habits are at baseline with BMs every other day.  No other significant upper or lower GI symptoms. Last colonoscopy in August 2015 with moderate diverticulosis in the transverse and descending colon, redundant left colon, normal anastomosis, 5 polyps removed from the rectum with pathology revealing 1 tubular adenoma and multiple fragments of hyperplastic polyps.  Recommend repeat colonoscopy in 5-10 years.  Considering recurrent diverticulitis, rectal bleeding, heme positive stool, I feel patient does need to have a follow-up colonoscopy for evaluation.  It is possible that she could have had bleeding from a separate diverticula that was associated with a segment of colon that was acutely inflamed.  Patient is agreeable to proceeding with colonoscopy but expressed concern about nausea with colon prep.  I will send a prescription for Zofran for day of colon prep.  Plan: Update CBC. Proceed with colonoscopy with propofol with Dr. Abbey Chatters in the near future. The risks, benefits, and alternatives have been discussed in detail with patient. They have stated understanding and desire to proceed.  ASA III- notably, patient has pacemaker. Prescription for Zofran 4 mg every 8 hours as needed for nausea on the day of colon prep sent to pharmacy. Add Benefiber or Metamucil daily. Follow-up as needed after colonoscopy or as Dr. Abbey Chatters  recommends.

## 2019-08-09 NOTE — Assessment & Plan Note (Signed)
Patient recently hospitalized in June 2021 for acute diverticulitis.  Hemoglobin was 13.5 on admission and trended down to 11.4 during hospitalization.  Overall, suspect this was secondary to hydration effect; however, daughter reports patient had an episode of rectal bleeding in the emergency department and she does have documented heme positive stool on 06/20/2019.  She denies any ongoing rectal bleeding.  No melena.  Abdominal pain has resolved.  No other significant GI symptoms.  If possible single episode of rectal bleeding may have been secondary to bleeding diverticulum not associated with the segment of colon that was acutely inflamed.  We will plan to update CBC to see if hemoglobin has returned to normal. We are also scheduling colonoscopy with propofol with Dr. Abbey Chatters in the near future due to recurrent diverticulitis along with episode of rectal bleeding and heme positive stool.

## 2019-08-09 NOTE — Patient Instructions (Addendum)
Please have labs completed at [facility] lab.  We will get you scheduled for a colonoscopy in the near future with Dr. Abbey Chatters.   I am sending a limited supply of Zofran 4 mg to your pharmacy.  You may take Zofran starting the day of your colon prep every 8 hours as needed to help with nausea during your prep.  I recommend you add Benefiber or Metamucil daily.  We will see you back after your colonoscopy as needed.  Do not hesitate to call with questions or concerns.  Ashley Altes, PA-C [facility]

## 2019-08-15 DIAGNOSIS — D631 Anemia in chronic kidney disease: Secondary | ICD-10-CM | POA: Diagnosis not present

## 2019-08-15 DIAGNOSIS — E559 Vitamin D deficiency, unspecified: Secondary | ICD-10-CM | POA: Diagnosis not present

## 2019-08-15 DIAGNOSIS — E211 Secondary hyperparathyroidism, not elsewhere classified: Secondary | ICD-10-CM | POA: Diagnosis not present

## 2019-08-15 DIAGNOSIS — R809 Proteinuria, unspecified: Secondary | ICD-10-CM | POA: Diagnosis not present

## 2019-08-15 DIAGNOSIS — N184 Chronic kidney disease, stage 4 (severe): Secondary | ICD-10-CM | POA: Diagnosis not present

## 2019-08-15 DIAGNOSIS — N189 Chronic kidney disease, unspecified: Secondary | ICD-10-CM | POA: Diagnosis not present

## 2019-08-15 DIAGNOSIS — I129 Hypertensive chronic kidney disease with stage 1 through stage 4 chronic kidney disease, or unspecified chronic kidney disease: Secondary | ICD-10-CM | POA: Diagnosis not present

## 2019-08-20 ENCOUNTER — Telehealth: Payer: Self-pay | Admitting: *Deleted

## 2019-08-20 NOTE — Telephone Encounter (Signed)
Called pt. TCS with propofol with Dr. Abbey Chatters scheduled for 9/14 at [facility] will need pre-op/covid testing prior. Confirmed mailing address. Will get miralax OTC.

## 2019-08-21 ENCOUNTER — Encounter: Payer: Self-pay | Admitting: *Deleted

## 2019-09-18 NOTE — Patient Instructions (Signed)
Ashley Olsen  09/18/2019     @PREFPERIOPPHARMACY @   Your procedure is scheduled on  09/25/2019.  Report to [facility] at  0730  A.M.  Call this number if you have problems the morning of surgery:  [phone]   Remember:  Follow the diet and prep instructions given to you by the office.                     Take these medicines the morning of surgery with A SIP OF WATER  Amlodipine, namenda, zofran(IF NEEDED), tramaol(IF NEEDED).    Do not wear jewelry, make-up or nail polish.  Do not wear lotions, powders, or perfumes. Please wear deodorant and brush your teeth.  Do not shave 48 hours prior to surgery.  Men may shave face and neck.  Do not bring valuables to the hospital.  [facility] is not responsible for any belongings or valuables.  Contacts, dentures or bridgework may not be worn into surgery.  Leave your suitcase in the car.  After surgery it may be brought to your room.  For patients admitted to the hospital, discharge time will be determined by your treatment team.  Patients discharged the day of surgery will not be allowed to drive home.   Name and phone number of your driver:   family Special instructions:  DO NOT smoke the morning of your procedure.  Please read over the following fact sheets that you were given. Anesthesia Post-op Instructions and Care and Recovery After Surgery       Colonoscopy, Adult, Care After This sheet gives you information about how to care for yourself after your procedure. Your health care provider may also give you more specific instructions. If you have problems or questions, contact your health care provider. What can I expect after the procedure? After the procedure, it is common to have:  A small amount of blood in your stool for 24 hours after the procedure.  Some gas.  Mild cramping or bloating of your abdomen. Follow these instructions at home: Eating and drinking   Drink enough fluid to keep your urine  pale yellow.  Follow instructions from your health care provider about eating or drinking restrictions.  Resume your normal diet as instructed by your health care provider. Avoid heavy or fried foods that are hard to digest. Activity  Rest as told by your health care provider.  Avoid sitting for a long time without moving. Get up to take short walks every 1-2 hours. This is important to improve blood flow and breathing. Ask for help if you feel weak or unsteady.  Return to your normal activities as told by your health care provider. Ask your health care provider what activities are safe for you. Managing cramping and bloating   Try walking around when you have cramps or feel bloated.  Apply heat to your abdomen as told by your health care provider. Use the heat source that your health care provider recommends, such as a moist heat pack or a heating pad. ? Place a towel between your skin and the heat source. ? Leave the heat on for 20-30 minutes. ? Remove the heat if your skin turns bright red. This is especially important if you are unable to feel pain, heat, or cold. You may have a greater risk of getting burned. General instructions  For the first 24 hours after the procedure: ? Do not drive or use machinery. ? Do  not sign important documents. ? Do not drink alcohol. ? Do your regular daily activities at a slower pace than normal. ? Eat soft foods that are easy to digest.  Take over-the-counter and prescription medicines only as told by your health care provider.  Keep all follow-up visits as told by your health care provider. This is important. Contact a health care provider if:  You have blood in your stool 2-3 days after the procedure. Get help right away if you have:  More than a small spotting of blood in your stool.  Large blood clots in your stool.  Swelling of your abdomen.  Nausea or vomiting.  A fever.  Increasing pain in your abdomen that is not relieved  with medicine. Summary  After the procedure, it is common to have a small amount of blood in your stool. You may also have mild cramping and bloating of your abdomen.  For the first 24 hours after the procedure, do not drive or use machinery, sign important documents, or drink alcohol.  Get help right away if you have a lot of blood in your stool, nausea or vomiting, a fever, or increased pain in your abdomen. This information is not intended to replace advice given to you by your health care provider. Make sure you discuss any questions you have with your health care provider. Document Revised: 07/24/2018 Document Reviewed: 07/24/2018 Elsevier Patient Education  [place] After These instructions provide you with information about caring for yourself after your procedure. Your health care provider may also give you more specific instructions. Your treatment has been planned according to current medical practices, but problems sometimes occur. Call your health care provider if you have any problems or questions after your procedure. What can I expect after the procedure? After your procedure, you may:  Feel sleepy for several hours.  Feel clumsy and have poor balance for several hours.  Feel forgetful about what happened after the procedure.  Have poor judgment for several hours.  Feel nauseous or vomit.  Have a sore throat if you had a breathing tube during the procedure. Follow these instructions at home: For at least 24 hours after the procedure:      Have a responsible adult stay with you. It is important to have someone help care for you until you are awake and alert.  Rest as needed.  Do not: ? Participate in activities in which you could fall or become injured. ? Drive. ? Use heavy machinery. ? Drink alcohol. ? Take sleeping pills or medicines that cause drowsiness. ? Make important decisions or sign legal documents. ? Take  care of children on your own. Eating and drinking  Follow the diet that is recommended by your health care provider.  If you vomit, drink water, juice, or soup when you can drink without vomiting.  Make sure you have little or no nausea before eating solid foods. General instructions  Take over-the-counter and prescription medicines only as told by your health care provider.  If you have sleep apnea, surgery and certain medicines can increase your risk for breathing problems. Follow instructions from your health care provider about wearing your sleep device: ? Anytime you are sleeping, including during daytime naps. ? While taking prescription pain medicines, sleeping medicines, or medicines that make you drowsy.  If you smoke, do not smoke without supervision.  Keep all follow-up visits as told by your health care provider. This is important. Contact a health care  provider if:  You keep feeling nauseous or you keep vomiting.  You feel light-headed.  You develop a rash.  You have a fever. Get help right away if:  You have trouble breathing. Summary  For several hours after your procedure, you may feel sleepy and have poor judgment.  Have a responsible adult stay with you for at least 24 hours or until you are awake and alert. This information is not intended to replace advice given to you by your health care provider. Make sure you discuss any questions you have with your health care provider. Document Revised: 03/28/2017 Document Reviewed: 04/20/2015 Elsevier Patient Education  [place].

## 2019-09-20 ENCOUNTER — Telehealth: Payer: Self-pay | Admitting: *Deleted

## 2019-09-20 ENCOUNTER — Encounter (HOSPITAL_COMMUNITY)
Admission: RE | Admit: 2019-09-20 | Discharge: 2019-09-20 | Disposition: A | Discharge status: Home / Self Care | Payer: PPO | Source: Ambulatory Visit | Attending: Internal Medicine | Admitting: Internal Medicine

## 2019-09-20 ENCOUNTER — Encounter (HOSPITAL_COMMUNITY): Payer: Self-pay

## 2019-09-20 DIAGNOSIS — M542 Cervicalgia: Secondary | ICD-10-CM | POA: Diagnosis not present

## 2019-09-20 NOTE — Telephone Encounter (Signed)
Noted  

## 2019-09-20 NOTE — Telephone Encounter (Signed)
Noted. Will await call back from patient and family.

## 2019-09-20 NOTE — Telephone Encounter (Signed)
-----   Message from Josue Hector sent at 09/20/2019 11:16 AM EDT ----- This patient was on for Tuesday for [place].  The daughter called earlier in the week & told me she couldn't do her PAT on Friday so I moved it to today.  The daughter called back this morning & said she has to take her to her PCP today, she is having neck problems.  I don't have any PATs available on Monday and since they don't know what is going on with her I have pulled her into the depot and told the daughter to call the office to reschedule her procedure.  Thanks, Hoyle Sauer

## 2019-09-21 ENCOUNTER — Other Ambulatory Visit (HOSPITAL_COMMUNITY): Payer: PPO

## 2019-09-21 ENCOUNTER — Encounter (HOSPITAL_COMMUNITY): Admission: RE | Admit: 2019-09-21 | Payer: PPO | Source: Ambulatory Visit

## 2019-09-21 ENCOUNTER — Telehealth: Payer: Self-pay

## 2019-09-21 NOTE — Telephone Encounter (Signed)
The pt just wanted to make sure her monitor was working properly. I let her know that the monitor is up to date and is working the way it supposed to.

## 2019-09-24 ENCOUNTER — Other Ambulatory Visit (HOSPITAL_COMMUNITY): Payer: PPO

## 2019-09-24 NOTE — Telephone Encounter (Signed)
Spoke with daughter. Pt has been rescheduled to 11/2 at 10:00am. Aware will mail new prep instructions with new pre-op/covid test. Confirmed mailing address is correct. Called endo and made aware.

## 2019-09-25 DIAGNOSIS — R7301 Impaired fasting glucose: Secondary | ICD-10-CM | POA: Diagnosis not present

## 2019-09-25 DIAGNOSIS — R634 Abnormal weight loss: Secondary | ICD-10-CM | POA: Diagnosis not present

## 2019-09-25 DIAGNOSIS — R011 Cardiac murmur, unspecified: Secondary | ICD-10-CM | POA: Diagnosis not present

## 2019-09-25 DIAGNOSIS — F17210 Nicotine dependence, cigarettes, uncomplicated: Secondary | ICD-10-CM | POA: Diagnosis not present

## 2019-09-25 DIAGNOSIS — H6122 Impacted cerumen, left ear: Secondary | ICD-10-CM | POA: Diagnosis not present

## 2019-09-25 DIAGNOSIS — M545 Low back pain: Secondary | ICD-10-CM | POA: Diagnosis not present

## 2019-09-25 DIAGNOSIS — R05 Cough: Secondary | ICD-10-CM | POA: Diagnosis not present

## 2019-09-25 DIAGNOSIS — N184 Chronic kidney disease, stage 4 (severe): Secondary | ICD-10-CM | POA: Diagnosis not present

## 2019-09-25 DIAGNOSIS — R638 Other symptoms and signs concerning food and fluid intake: Secondary | ICD-10-CM | POA: Diagnosis not present

## 2019-09-25 DIAGNOSIS — Z2821 Immunization not carried out because of patient refusal: Secondary | ICD-10-CM | POA: Diagnosis not present

## 2019-09-25 DIAGNOSIS — K5793 Diverticulitis of intestine, part unspecified, without perforation or abscess with bleeding: Secondary | ICD-10-CM | POA: Diagnosis not present

## 2019-09-25 DIAGNOSIS — I350 Nonrheumatic aortic (valve) stenosis: Secondary | ICD-10-CM | POA: Diagnosis not present

## 2019-10-09 ENCOUNTER — Ambulatory Visit (INDEPENDENT_AMBULATORY_CARE_PROVIDER_SITE_OTHER): Payer: PPO | Admitting: Emergency Medicine

## 2019-10-09 DIAGNOSIS — R001 Bradycardia, unspecified: Secondary | ICD-10-CM

## 2019-10-09 LAB — CUP PACEART REMOTE DEVICE CHECK
Battery Remaining Longevity: 109 mo
Battery Remaining Percentage: 95.5 %
Battery Voltage: 2.99 V
Brady Statistic AP VP Percent: 2.5 %
Brady Statistic AP VS Percent: 80 %
Brady Statistic AS VP Percent: 1 %
Brady Statistic AS VS Percent: 17 %
Brady Statistic RA Percent Paced: 82 %
Brady Statistic RV Percent Paced: 2.6 %
Date Time Interrogation Session: 20210928020020
Implantable Lead Implant Date: 20161013
Implantable Lead Implant Date: 20161013
Implantable Lead Location: 753859
Implantable Lead Location: 753860
Implantable Pulse Generator Implant Date: 20161013
Lead Channel Impedance Value: 360 Ohm
Lead Channel Impedance Value: 400 Ohm
Lead Channel Pacing Threshold Amplitude: 0.75 V
Lead Channel Pacing Threshold Amplitude: 0.75 V
Lead Channel Pacing Threshold Pulse Width: 0.5 ms
Lead Channel Pacing Threshold Pulse Width: 0.5 ms
Lead Channel Sensing Intrinsic Amplitude: 1.7 mV
Lead Channel Sensing Intrinsic Amplitude: 12 mV
Lead Channel Setting Pacing Amplitude: 2 V
Lead Channel Setting Pacing Amplitude: 2.5 V
Lead Channel Setting Pacing Pulse Width: 0.5 ms
Lead Channel Setting Sensing Sensitivity: 2 mV
Pulse Gen Model: 2240
Pulse Gen Serial Number: 7820884

## 2019-10-10 DIAGNOSIS — R7301 Impaired fasting glucose: Secondary | ICD-10-CM | POA: Diagnosis not present

## 2019-10-10 DIAGNOSIS — G3184 Mild cognitive impairment, so stated: Secondary | ICD-10-CM | POA: Diagnosis not present

## 2019-10-10 DIAGNOSIS — H6122 Impacted cerumen, left ear: Secondary | ICD-10-CM | POA: Diagnosis not present

## 2019-10-10 DIAGNOSIS — N3281 Overactive bladder: Secondary | ICD-10-CM | POA: Diagnosis not present

## 2019-10-10 DIAGNOSIS — N184 Chronic kidney disease, stage 4 (severe): Secondary | ICD-10-CM | POA: Diagnosis not present

## 2019-10-10 DIAGNOSIS — I129 Hypertensive chronic kidney disease with stage 1 through stage 4 chronic kidney disease, or unspecified chronic kidney disease: Secondary | ICD-10-CM | POA: Diagnosis not present

## 2019-10-10 DIAGNOSIS — F17200 Nicotine dependence, unspecified, uncomplicated: Secondary | ICD-10-CM | POA: Diagnosis not present

## 2019-10-10 DIAGNOSIS — Z23 Encounter for immunization: Secondary | ICD-10-CM | POA: Diagnosis not present

## 2019-10-10 DIAGNOSIS — M1990 Unspecified osteoarthritis, unspecified site: Secondary | ICD-10-CM | POA: Diagnosis not present

## 2019-10-10 DIAGNOSIS — Z950 Presence of cardiac pacemaker: Secondary | ICD-10-CM | POA: Diagnosis not present

## 2019-10-10 DIAGNOSIS — E782 Mixed hyperlipidemia: Secondary | ICD-10-CM | POA: Diagnosis not present

## 2019-10-10 DIAGNOSIS — I350 Nonrheumatic aortic (valve) stenosis: Secondary | ICD-10-CM | POA: Diagnosis not present

## 2019-10-11 DIAGNOSIS — I350 Nonrheumatic aortic (valve) stenosis: Secondary | ICD-10-CM | POA: Diagnosis not present

## 2019-10-11 DIAGNOSIS — F17200 Nicotine dependence, unspecified, uncomplicated: Secondary | ICD-10-CM | POA: Diagnosis not present

## 2019-10-11 DIAGNOSIS — E782 Mixed hyperlipidemia: Secondary | ICD-10-CM | POA: Diagnosis not present

## 2019-10-11 DIAGNOSIS — I129 Hypertensive chronic kidney disease with stage 1 through stage 4 chronic kidney disease, or unspecified chronic kidney disease: Secondary | ICD-10-CM | POA: Diagnosis not present

## 2019-10-11 DIAGNOSIS — Z23 Encounter for immunization: Secondary | ICD-10-CM | POA: Diagnosis not present

## 2019-10-11 DIAGNOSIS — R7301 Impaired fasting glucose: Secondary | ICD-10-CM | POA: Diagnosis not present

## 2019-10-11 DIAGNOSIS — N184 Chronic kidney disease, stage 4 (severe): Secondary | ICD-10-CM | POA: Diagnosis not present

## 2019-10-11 DIAGNOSIS — Z950 Presence of cardiac pacemaker: Secondary | ICD-10-CM | POA: Diagnosis not present

## 2019-10-11 DIAGNOSIS — M1990 Unspecified osteoarthritis, unspecified site: Secondary | ICD-10-CM | POA: Diagnosis not present

## 2019-10-11 DIAGNOSIS — H6122 Impacted cerumen, left ear: Secondary | ICD-10-CM | POA: Diagnosis not present

## 2019-10-11 DIAGNOSIS — G3184 Mild cognitive impairment, so stated: Secondary | ICD-10-CM | POA: Diagnosis not present

## 2019-10-11 DIAGNOSIS — N3281 Overactive bladder: Secondary | ICD-10-CM | POA: Diagnosis not present

## 2019-10-11 NOTE — Progress Notes (Signed)
Remote pacemaker transmission.   

## 2019-10-30 ENCOUNTER — Other Ambulatory Visit: Payer: Self-pay

## 2019-10-30 ENCOUNTER — Ambulatory Visit (INDEPENDENT_AMBULATORY_CARE_PROVIDER_SITE_OTHER): Payer: PPO | Admitting: Urology

## 2019-10-30 ENCOUNTER — Encounter: Payer: Self-pay | Admitting: Urology

## 2019-10-30 VITALS — BP 163/79 | HR 62 | Temp 98.8°F | Ht 62.0 in | Wt 148.6 lb

## 2019-10-30 DIAGNOSIS — N393 Stress incontinence (female) (male): Secondary | ICD-10-CM | POA: Diagnosis not present

## 2019-10-30 LAB — URINALYSIS, ROUTINE W REFLEX MICROSCOPIC
Bilirubin, UA: NEGATIVE
Glucose, UA: NEGATIVE
Ketones, UA: NEGATIVE
Leukocytes,UA: NEGATIVE
Nitrite, UA: NEGATIVE
Protein,UA: NEGATIVE
RBC, UA: NEGATIVE
Specific Gravity, UA: 1.01 (ref 1.005–1.030)
Urobilinogen, Ur: 0.2 mg/dL (ref 0.2–1.0)
pH, UA: 5.5 (ref 5.0–7.5)

## 2019-10-30 LAB — BLADDER SCAN AMB NON-IMAGING: Scan Result: 4

## 2019-10-30 MED ORDER — MIRABEGRON ER 25 MG PO TB24
25.0000 mg | ORAL_TABLET | Freq: Every day | ORAL | 0 refills | Status: DC
Start: 2019-10-30 — End: 2020-02-08

## 2019-10-30 NOTE — Patient Instructions (Signed)
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
## 2019-10-30 NOTE — Progress Notes (Signed)
PVR = 4   Urological Symptom Review  Patient is experiencing the following symptoms: Frequent urination Hard to postpone urination Get up at night to urinate Leakage of urine  Review of Systems  Gastrointestinal (upper)  : Negative for upper GI symptoms  Gastrointestinal (lower) : Constipation  Constitutional : Weight loss Fatigue  Skin: Negative for skin symptoms  Eyes: Negative for eye symptoms  Ear/Nose/Throat : Negative for Ear/Nose/Throat symptoms  Hematologic/Lymphatic: Negative for Hematologic/Lymphatic symptoms  Cardiovascular : Negative for cardiovascular symptoms  Respiratory : Cough  Endocrine: Negative for endocrine symptoms  Musculoskeletal: Negative for musculoskeletal symptoms  Neurological: Negative for neurological symptoms  Psychologic: Negative for psychiatric symptoms

## 2019-11-03 DIAGNOSIS — I959 Hypotension, unspecified: Secondary | ICD-10-CM | POA: Diagnosis not present

## 2019-11-03 DIAGNOSIS — R1084 Generalized abdominal pain: Secondary | ICD-10-CM | POA: Diagnosis not present

## 2019-11-03 DIAGNOSIS — R52 Pain, unspecified: Secondary | ICD-10-CM | POA: Diagnosis not present

## 2019-11-04 ENCOUNTER — Other Ambulatory Visit: Payer: Self-pay

## 2019-11-04 ENCOUNTER — Emergency Department (HOSPITAL_COMMUNITY)
Admission: EM | Admit: 2019-11-04 | Discharge: 2019-11-04 | Disposition: A | Discharge status: Home / Self Care | Payer: PPO | Attending: Emergency Medicine | Admitting: Emergency Medicine

## 2019-11-04 ENCOUNTER — Emergency Department (HOSPITAL_COMMUNITY): Payer: PPO

## 2019-11-04 ENCOUNTER — Encounter (HOSPITAL_COMMUNITY): Payer: Self-pay | Admitting: Emergency Medicine

## 2019-11-04 DIAGNOSIS — N184 Chronic kidney disease, stage 4 (severe): Secondary | ICD-10-CM | POA: Diagnosis not present

## 2019-11-04 DIAGNOSIS — F17210 Nicotine dependence, cigarettes, uncomplicated: Secondary | ICD-10-CM | POA: Diagnosis not present

## 2019-11-04 DIAGNOSIS — I129 Hypertensive chronic kidney disease with stage 1 through stage 4 chronic kidney disease, or unspecified chronic kidney disease: Secondary | ICD-10-CM | POA: Insufficient documentation

## 2019-11-04 DIAGNOSIS — D3500 Benign neoplasm of unspecified adrenal gland: Secondary | ICD-10-CM | POA: Diagnosis not present

## 2019-11-04 DIAGNOSIS — N281 Cyst of kidney, acquired: Secondary | ICD-10-CM | POA: Diagnosis not present

## 2019-11-04 DIAGNOSIS — R112 Nausea with vomiting, unspecified: Secondary | ICD-10-CM | POA: Insufficient documentation

## 2019-11-04 DIAGNOSIS — Z79899 Other long term (current) drug therapy: Secondary | ICD-10-CM | POA: Insufficient documentation

## 2019-11-04 DIAGNOSIS — R109 Unspecified abdominal pain: Secondary | ICD-10-CM | POA: Diagnosis present

## 2019-11-04 DIAGNOSIS — J449 Chronic obstructive pulmonary disease, unspecified: Secondary | ICD-10-CM | POA: Insufficient documentation

## 2019-11-04 DIAGNOSIS — K529 Noninfective gastroenteritis and colitis, unspecified: Secondary | ICD-10-CM | POA: Diagnosis not present

## 2019-11-04 DIAGNOSIS — N2889 Other specified disorders of kidney and ureter: Secondary | ICD-10-CM | POA: Diagnosis not present

## 2019-11-04 DIAGNOSIS — K5730 Diverticulosis of large intestine without perforation or abscess without bleeding: Secondary | ICD-10-CM | POA: Diagnosis not present

## 2019-11-04 DIAGNOSIS — Z950 Presence of cardiac pacemaker: Secondary | ICD-10-CM | POA: Diagnosis not present

## 2019-11-04 DIAGNOSIS — I10 Essential (primary) hypertension: Secondary | ICD-10-CM | POA: Diagnosis not present

## 2019-11-04 LAB — COMPREHENSIVE METABOLIC PANEL
ALT: 13 U/L (ref 0–44)
AST: 16 U/L (ref 15–41)
Albumin: 3.8 g/dL (ref 3.5–5.0)
Alkaline Phosphatase: 77 U/L (ref 38–126)
Anion gap: 9 (ref 5–15)
BUN: 27 mg/dL — ABNORMAL HIGH (ref 8–23)
CO2: 25 mmol/L (ref 22–32)
Calcium: 9.3 mg/dL (ref 8.9–10.3)
Chloride: 109 mmol/L (ref 98–111)
Creatinine, Ser: 2.2 mg/dL — ABNORMAL HIGH (ref 0.44–1.00)
GFR, Estimated: 21 mL/min — ABNORMAL LOW (ref >60)
Glucose, Bld: 136 mg/dL — ABNORMAL HIGH (ref 70–99)
Potassium: 3.5 mmol/L (ref 3.5–5.1)
Sodium: 143 mmol/L (ref 135–145)
Total Bilirubin: 0.4 mg/dL (ref 0.3–1.2)
Total Protein: 6.6 g/dL (ref 6.5–8.1)

## 2019-11-04 LAB — CBC WITH DIFFERENTIAL/PLATELET
Abs Immature Granulocytes: 0.02 10*3/uL (ref 0.00–0.07)
Basophils Absolute: 0.1 10*3/uL (ref 0.0–0.1)
Basophils Relative: 1 %
Eosinophils Absolute: 0.8 10*3/uL — ABNORMAL HIGH (ref 0.0–0.5)
Eosinophils Relative: 9 %
HCT: 42 % (ref 36.0–46.0)
Hemoglobin: 13.6 g/dL (ref 12.0–15.0)
Immature Granulocytes: 0 %
Lymphocytes Relative: 25 %
Lymphs Abs: 2.1 10*3/uL (ref 0.7–4.0)
MCH: 30.4 pg (ref 26.0–34.0)
MCHC: 32.4 g/dL (ref 30.0–36.0)
MCV: 94 fL (ref 80.0–100.0)
Monocytes Absolute: 0.6 10*3/uL (ref 0.1–1.0)
Monocytes Relative: 7 %
Neutro Abs: 5 10*3/uL (ref 1.7–7.7)
Neutrophils Relative %: 58 %
Platelets: 150 10*3/uL (ref 150–400)
RBC: 4.47 MIL/uL (ref 3.87–5.11)
RDW: 13.4 % (ref 11.5–15.5)
WBC: 8.5 10*3/uL (ref 4.0–10.5)
nRBC: 0 % (ref 0.0–0.2)

## 2019-11-04 LAB — URINALYSIS, ROUTINE W REFLEX MICROSCOPIC
Bacteria, UA: NONE SEEN
Bilirubin Urine: NEGATIVE
Glucose, UA: NEGATIVE mg/dL
Ketones, ur: NEGATIVE mg/dL
Nitrite: NEGATIVE
Protein, ur: 30 mg/dL — AB
Specific Gravity, Urine: 1.011 (ref 1.005–1.030)
pH: 6 (ref 5.0–8.0)

## 2019-11-04 LAB — LIPASE, BLOOD: Lipase: 38 U/L (ref 11–51)

## 2019-11-04 MED ORDER — AMOXICILLIN-POT CLAVULANATE 875-125 MG PO TABS
1.0000 | ORAL_TABLET | Freq: Once | ORAL | Status: AC
  Administered 2019-11-04: 1 via ORAL
  Filled 2019-11-04: qty 1

## 2019-11-04 MED ORDER — AMOXICILLIN-POT CLAVULANATE 875-125 MG PO TABS: 1.0000 | ORAL_TABLET | Freq: Two times a day (BID) | ORAL | 0 refills | Status: DC

## 2019-11-04 NOTE — ED Triage Notes (Signed)
[facility] - pt c/o severe abdominal pain, N, and V. Feels better know after emesis x 1.
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
## 2019-11-09 NOTE — Patient Instructions (Signed)
Ashley Olsen  11/09/2019     @PREFPERIOPPHARMACY @   Your procedure is scheduled on  11/13/2019  Report to [facility] at  [place].  Call this number if you have problems the morning of surgery:  [phone]   Remember:  Follow the diet and prep instructions given to you by the office.                       Take these medicines the morning of surgery with A SIP OF WATER  Amlodipine, namenda, mirabegron, zofran(if needed), zanaflex.    Do not wear jewelry, make-up or nail polish.  Do not wear lotions, powders, or perfumes. Please wear deodorant and brush your teeth.  Do not shave 48 hours prior to surgery.  Men may shave face and neck.  Do not bring valuables to the hospital.  [facility] is not responsible for any belongings or valuables.  Contacts, dentures or bridgework may not be worn into surgery.  Leave your suitcase in the car.  After surgery it may be brought to your room.  For patients admitted to the hospital, discharge time will be determined by your treatment team.  Patients discharged the day of surgery will not be allowed to drive home.   Name and phone number of your driver:   family Special instructions:  DO NOT smoke the day of your procedure.  Please read over the following fact sheets that you were given. Anesthesia Post-op Instructions and Care and Recovery After Surgery       Colonoscopy, Adult, Care After This sheet gives you information about how to care for yourself after your procedure. Your health care provider may also give you more specific instructions. If you have problems or questions, contact your health care provider. What can I expect after the procedure? After the procedure, it is common to have:  A small amount of blood in your stool for 24 hours after the procedure.  Some gas.  Mild cramping or bloating of your abdomen. Follow these instructions at home: Eating and drinking   Drink enough fluid to keep your urine  pale yellow.  Follow instructions from your health care provider about eating or drinking restrictions.  Resume your normal diet as instructed by your health care provider. Avoid heavy or fried foods that are hard to digest. Activity  Rest as told by your health care provider.  Avoid sitting for a long time without moving. Get up to take short walks every 1-2 hours. This is important to improve blood flow and breathing. Ask for help if you feel weak or unsteady.  Return to your normal activities as told by your health care provider. Ask your health care provider what activities are safe for you. Managing cramping and bloating   Try walking around when you have cramps or feel bloated.  Apply heat to your abdomen as told by your health care provider. Use the heat source that your health care provider recommends, such as a moist heat pack or a heating pad. ? Place a towel between your skin and the heat source. ? Leave the heat on for 20-30 minutes. ? Remove the heat if your skin turns bright red. This is especially important if you are unable to feel pain, heat, or cold. You may have a greater risk of getting burned. General instructions  For the first 24 hours after the procedure: ? Do not drive or use machinery. ?  Do not sign important documents. ? Do not drink alcohol. ? Do your regular daily activities at a slower pace than normal. ? Eat soft foods that are easy to digest.  Take over-the-counter and prescription medicines only as told by your health care provider.  Keep all follow-up visits as told by your health care provider. This is important. Contact a health care provider if:  You have blood in your stool 2-3 days after the procedure. Get help right away if you have:  More than a small spotting of blood in your stool.  Large blood clots in your stool.  Swelling of your abdomen.  Nausea or vomiting.  A fever.  Increasing pain in your abdomen that is not relieved  with medicine. Summary  After the procedure, it is common to have a small amount of blood in your stool. You may also have mild cramping and bloating of your abdomen.  For the first 24 hours after the procedure, do not drive or use machinery, sign important documents, or drink alcohol.  Get help right away if you have a lot of blood in your stool, nausea or vomiting, a fever, or increased pain in your abdomen. This information is not intended to replace advice given to you by your health care provider. Make sure you discuss any questions you have with your health care provider. Document Revised: 07/24/2018 Document Reviewed: 07/24/2018 Elsevier Patient Education  [place] After These instructions provide you with information about caring for yourself after your procedure. Your health care provider may also give you more specific instructions. Your treatment has been planned according to current medical practices, but problems sometimes occur. Call your health care provider if you have any problems or questions after your procedure. What can I expect after the procedure? After your procedure, you may:  Feel sleepy for several hours.  Feel clumsy and have poor balance for several hours.  Feel forgetful about what happened after the procedure.  Have poor judgment for several hours.  Feel nauseous or vomit.  Have a sore throat if you had a breathing tube during the procedure. Follow these instructions at home: For at least 24 hours after the procedure:      Have a responsible adult stay with you. It is important to have someone help care for you until you are awake and alert.  Rest as needed.  Do not: ? Participate in activities in which you could fall or become injured. ? Drive. ? Use heavy machinery. ? Drink alcohol. ? Take sleeping pills or medicines that cause drowsiness. ? Make important decisions or sign legal documents. ? Take  care of children on your own. Eating and drinking  Follow the diet that is recommended by your health care provider.  If you vomit, drink water, juice, or soup when you can drink without vomiting.  Make sure you have little or no nausea before eating solid foods. General instructions  Take over-the-counter and prescription medicines only as told by your health care provider.  If you have sleep apnea, surgery and certain medicines can increase your risk for breathing problems. Follow instructions from your health care provider about wearing your sleep device: ? Anytime you are sleeping, including during daytime naps. ? While taking prescription pain medicines, sleeping medicines, or medicines that make you drowsy.  If you smoke, do not smoke without supervision.  Keep all follow-up visits as told by your health care provider. This is important. Contact a health  care provider if:  You keep feeling nauseous or you keep vomiting.  You feel light-headed.  You develop a rash.  You have a fever. Get help right away if:  You have trouble breathing. Summary  For several hours after your procedure, you may feel sleepy and have poor judgment.  Have a responsible adult stay with you for at least 24 hours or until you are awake and alert. This information is not intended to replace advice given to you by your health care provider. Make sure you discuss any questions you have with your health care provider. Document Revised: 03/28/2017 Document Reviewed: 04/20/2015 Elsevier Patient Education  [place].

## 2019-11-12 ENCOUNTER — Telehealth: Payer: Self-pay | Admitting: Internal Medicine

## 2019-11-12 ENCOUNTER — Encounter (HOSPITAL_COMMUNITY)
Admission: RE | Admit: 2019-11-12 | Discharge: 2019-11-12 | Disposition: A | Discharge status: Home / Self Care | Payer: PPO | Source: Ambulatory Visit | Attending: Internal Medicine | Admitting: Internal Medicine

## 2019-11-12 ENCOUNTER — Other Ambulatory Visit (HOSPITAL_COMMUNITY): Payer: PPO | Attending: Internal Medicine

## 2019-11-12 ENCOUNTER — Encounter (HOSPITAL_COMMUNITY): Payer: Self-pay

## 2019-11-12 NOTE — Telephone Encounter (Signed)
Endo had already cancelled TCS.  Spoke to pt's daughter, she was seen recently in ED for colitis. ED doctor told her TCS would need to be cancelled. Made OV 01/02/20 to see Aliene Altes PA.   FYI to [place].

## 2019-11-12 NOTE — Telephone Encounter (Signed)
Noted. Reviewed ED visit on 10/24. CT A/P without contrast with diffuse wall thickening with diverticula at the sigmoid rectal junction which could be due to mild proctocolitis, no loculated fluid collections or free air. She was prescribed Augmentin 875-125 BID x10 days. Spoke with patient's daughter. Patient is feeling better. No abdominal pain. She is still taking her antibiotics. Advised she continue her antibotics and we will see her in December as scheduled. She will call with worsening symptoms or other questions or concerns prior.

## 2019-11-12 NOTE — Telephone Encounter (Signed)
Pt wants to cancel her procedure for tomorrow with Dr Abbey Chatters. [phone]

## 2019-11-13 ENCOUNTER — Encounter (HOSPITAL_COMMUNITY): Admission: RE | Payer: Self-pay | Source: Home / Self Care

## 2019-11-13 SURGERY — COLONOSCOPY WITH PROPOFOL
Anesthesia: Monitor Anesthesia Care

## 2019-11-20 ENCOUNTER — Telehealth: Payer: Self-pay

## 2019-11-20 NOTE — Telephone Encounter (Signed)
Spoke with patient's daughter. Patient has mild abdominal discomfort intermittently like she needs to have a BM, but no BM in 3 days. No abdominal pain like she had with diverticulitis. Has completed antibiotics. Not taking anything to help with Bms currently. I advised starting MiraLAX 17 daily in Gatorade or other non-carbonated beverage she likes as she doesn't like mixing powders in water. Also advised to increase water intake.   Chronic intermittent morning nausea continues without significant change. Discussed possibly trying PPI to see if this would help as she may have nocturnal GERD symptoms. Patient's daughter states she will discuss this with her mother. States her mother is very resistant to medications and likely will not want to start anything at this time. She will call back with any ongoing problems.

## 2019-11-20 NOTE — Telephone Encounter (Signed)
Pt's daughter called. Pt hasn't had a BM in 3 days. Pt's daughter would like to know what she can take to have a BM and not have loose stool. It was recommended that pt take Metamucil or benefiber at her last ov. Pt doesn't take it due to the taste. Pt doesn't have a big appetite and pts daughter is working on that. Pt also has nausea most days and doesn't take her nausea medication. Pt's daughter is also working on helping her mother remember to take the nausea medication.

## 2019-11-21 ENCOUNTER — Ambulatory Visit (HOSPITAL_COMMUNITY): Admission: RE | Admit: 2019-11-21 | Payer: PPO | Source: Home / Self Care

## 2019-11-28 ENCOUNTER — Ambulatory Visit: Payer: PPO | Admitting: Urology

## 2019-12-03 DIAGNOSIS — R051 Acute cough: Secondary | ICD-10-CM | POA: Diagnosis not present

## 2019-12-03 DIAGNOSIS — J0100 Acute maxillary sinusitis, unspecified: Secondary | ICD-10-CM | POA: Diagnosis not present

## 2019-12-03 DIAGNOSIS — F17200 Nicotine dependence, unspecified, uncomplicated: Secondary | ICD-10-CM | POA: Diagnosis not present

## 2019-12-04 DIAGNOSIS — Z20822 Contact with and (suspected) exposure to covid-19: Secondary | ICD-10-CM | POA: Diagnosis not present
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
## 2020-01-02 ENCOUNTER — Ambulatory Visit (INDEPENDENT_AMBULATORY_CARE_PROVIDER_SITE_OTHER): Payer: PPO | Admitting: Gastroenterology

## 2020-01-02 ENCOUNTER — Encounter: Payer: Self-pay | Admitting: Gastroenterology

## 2020-01-02 ENCOUNTER — Other Ambulatory Visit: Payer: Self-pay

## 2020-01-02 ENCOUNTER — Telehealth: Payer: Self-pay | Admitting: Internal Medicine

## 2020-01-02 VITALS — BP 157/86 | HR 71 | Temp 96.8°F | Ht 63.0 in | Wt 142.8 lb

## 2020-01-02 DIAGNOSIS — Z86010 Personal history of colonic polyps: Secondary | ICD-10-CM | POA: Insufficient documentation

## 2020-01-02 DIAGNOSIS — K5792 Diverticulitis of intestine, part unspecified, without perforation or abscess without bleeding: Secondary | ICD-10-CM | POA: Diagnosis not present

## 2020-01-02 DIAGNOSIS — Z8719 Personal history of other diseases of the digestive system: Secondary | ICD-10-CM

## 2020-01-02 NOTE — Patient Instructions (Addendum)
We will get you scheduled for a colonoscopy in the near future with Dr. Abbey Chatters.  I had previously sent a prescription of Zofran to your pharmacy to take every 8 hours as needed for nausea while you are prepping for your colonoscopy. Please let me know if you need another prescription.  We will plan to see you back as Dr. Abbey Chatters recommends. Do not hesitate to call if you have any questions or concerns.  It was great to see you today! I hope you have a great Christmas!  Aliene Altes, PA-C [facility]

## 2020-01-02 NOTE — Assessment & Plan Note (Signed)
Addressed under history of diverticulitis below.

## 2020-01-02 NOTE — Assessment & Plan Note (Addendum)
86 year old female with history significant for adenomatous colon polyps and partial colectomy in 2007 secondary to diverticulitis. Since then, she has had 2 recurrent episodes of diverticulitis in May 2020 and June 2021. Single episode of rectal bleeding in the setting of diverticulitis in June. More recently, she was also found to have mild proctocolitis on CT in November 2021 treated with antibiotics. Today, she feels well and has no significant upper or lower GI symptoms. No alarm symptoms. Abdominal exam is benign. Last colonoscopy in August 2015 with moderate diverticulosis in transverse and descending colon, redundant left colon, normal anastomosis, 5 polyps removed from the rectum s/p placement of 1 cc spot at the most proximal diverticula. Pathology with 1 tubular adenoma and hyperplastic polyps. Recommended repeat colonoscopy in 5-10 years if benefits outweigh risk.  I have recommended 1 more colonoscopy due to history of adenomatous colon polyps and recurrent diverticulitis/colitis with need to rule out underlying malignancy. In general, patient is still fairly healthy and lives in independent life. I believe benefits outweigh risk at this time.  Plan: Proceed with colonoscopy with propofol with Dr. Abbey Chatters in the near future. The risks, benefits, and alternatives have been discussed with the patient in detail. The patient states understanding and desires to proceed.  ASA III *Please note patient has a pacemaker. Follow-up per Dr. Ave Filter recommendations. Advised to call with any new GI concerns.

## 2020-01-02 NOTE — Telephone Encounter (Signed)
Pt's daughter called to give the nurse a list of medications that the patient takes. [phone]

## 2020-01-02 NOTE — Telephone Encounter (Signed)
Noted. Reviewed.  No additional recommendations.

## 2020-01-02 NOTE — Progress Notes (Signed)
Pt's daughter called to update medication list.  Routing to [place], [place] as Juluis Rainier.

## 2020-01-02 NOTE — Telephone Encounter (Signed)
Daughter called to update medication list.  Routed to Aliene Altes, [place] as Juluis Rainier.

## 2020-01-03 DIAGNOSIS — I10 Essential (primary) hypertension: Secondary | ICD-10-CM | POA: Diagnosis not present

## 2020-01-03 DIAGNOSIS — R519 Headache, unspecified: Secondary | ICD-10-CM | POA: Diagnosis not present

## 2020-01-03 NOTE — Progress Notes (Signed)
Cc'ed to pcp °

## 2020-01-09 ENCOUNTER — Ambulatory Visit (INDEPENDENT_AMBULATORY_CARE_PROVIDER_SITE_OTHER): Payer: PPO

## 2020-01-09 DIAGNOSIS — R001 Bradycardia, unspecified: Secondary | ICD-10-CM

## 2020-01-09 DIAGNOSIS — I495 Sick sinus syndrome: Secondary | ICD-10-CM

## 2020-01-09 LAB — CUP PACEART REMOTE DEVICE CHECK
Battery Remaining Longevity: 109 mo
Battery Remaining Percentage: 95.5 %
Battery Voltage: 2.99 V
Brady Statistic AP VP Percent: 3 %
Brady Statistic AP VS Percent: 78 %
Brady Statistic AS VP Percent: 1 %
Brady Statistic AS VS Percent: 19 %
Brady Statistic RA Percent Paced: 80 %
Brady Statistic RV Percent Paced: 3.2 %
Date Time Interrogation Session: 20211229022129
Implantable Lead Implant Date: 20161013
Implantable Lead Implant Date: 20161013
Implantable Lead Location: 753859
Implantable Lead Location: 753860
Implantable Pulse Generator Implant Date: 20161013
Lead Channel Impedance Value: 360 Ohm
Lead Channel Impedance Value: 410 Ohm
Lead Channel Pacing Threshold Amplitude: 0.75 V
Lead Channel Pacing Threshold Amplitude: 0.75 V
Lead Channel Pacing Threshold Pulse Width: 0.5 ms
Lead Channel Pacing Threshold Pulse Width: 0.5 ms
Lead Channel Sensing Intrinsic Amplitude: 1.6 mV
Lead Channel Sensing Intrinsic Amplitude: 12 mV
Lead Channel Setting Pacing Amplitude: 2 V
Lead Channel Setting Pacing Amplitude: 2.5 V
Lead Channel Setting Pacing Pulse Width: 0.5 ms
Lead Channel Setting Sensing Sensitivity: 2 mV
Pulse Gen Model: 2240
Pulse Gen Serial Number: 7820884

## 2020-01-17 ENCOUNTER — Other Ambulatory Visit: Payer: PPO

## 2020-01-17 DIAGNOSIS — Z20822 Contact with and (suspected) exposure to covid-19: Secondary | ICD-10-CM | POA: Diagnosis not present

## 2020-01-19 LAB — SARS-COV-2, NAA 2 DAY TAT

## 2020-01-19 LAB — NOVEL CORONAVIRUS, NAA: SARS-CoV-2, NAA: NOT DETECTED

## 2020-01-21 ENCOUNTER — Telehealth: Payer: Self-pay | Admitting: *Deleted

## 2020-01-21 NOTE — Telephone Encounter (Signed)
Pt notified of negative COVID-19 results. Understanding verbalized.   

## 2020-01-23 NOTE — Progress Notes (Signed)
Remote pacemaker transmission.   

## 2020-01-24 ENCOUNTER — Telehealth: Payer: Self-pay | Admitting: Emergency Medicine

## 2020-01-24 NOTE — Telephone Encounter (Addendum)
Last remote transmission received on 01/09/2020. Regular remote transmissions received.

## 2020-01-24 NOTE — Telephone Encounter (Addendum)
Called patient's daughter (DPR) Fannie Knee and left a detailed message on voicemail in reference to home monitoring device. Last remote transmission received on 01/09/2020. Device number left on voicemail [phone] if patient or DPR have any other questions.

## 2020-02-14 ENCOUNTER — Telehealth: Payer: Self-pay

## 2020-02-14 NOTE — Telephone Encounter (Signed)
TCS w/Propofol w/Dr. Abbey Chatters ASA 3 for 02/19/20 has to be rescheduled per endo.  Tried to call pt's daughter [name]), LMOVM for return call.

## 2020-02-15 ENCOUNTER — Other Ambulatory Visit (HOSPITAL_COMMUNITY): Payer: PPO

## 2020-02-15 NOTE — Telephone Encounter (Signed)
Tried to call daughter, LMOVM and informed her pt's TCS for 02/19/20 has been canceled per endo. Advised her to call office to reschedule. Endo scheduler informed.

## 2020-02-18 ENCOUNTER — Other Ambulatory Visit (HOSPITAL_COMMUNITY): Admission: RE | Admit: 2020-02-18 | Payer: PPO | Source: Ambulatory Visit

## 2020-02-18 ENCOUNTER — Encounter (HOSPITAL_COMMUNITY): Admission: RE | Admit: 2020-02-18 | Payer: PPO | Source: Ambulatory Visit

## 2020-02-21 ENCOUNTER — Telehealth: Payer: Self-pay | Admitting: Internal Medicine

## 2020-02-21 NOTE — Telephone Encounter (Signed)
Spoke to pt's daughter, TCS w/Propofol w/Dr. Abbey Chatters ASA 3 rescheduled to 04/01/20--AM. Endo scheduler informed.

## 2020-02-21 NOTE — Telephone Encounter (Signed)
Pt's daughter (POA), [name], called to reschedule patient's colonoscopy with Dr Abbey Chatters and she can only take her Mon thru Thursday due to her work. Please call [phone]

## 2020-02-21 NOTE — Telephone Encounter (Signed)
Pre-op/covid test 03/28/20. Appt letter mailed with new procedure instructions.

## 2020-02-27 ENCOUNTER — Telehealth: Payer: Self-pay

## 2020-02-27 ENCOUNTER — Other Ambulatory Visit: Payer: Self-pay

## 2020-02-27 ENCOUNTER — Ambulatory Visit (INDEPENDENT_AMBULATORY_CARE_PROVIDER_SITE_OTHER): Payer: PPO | Admitting: Internal Medicine

## 2020-02-27 ENCOUNTER — Encounter: Payer: Self-pay | Admitting: Internal Medicine

## 2020-02-27 VITALS — BP 145/90 | HR 78 | Temp 98.4°F | Resp 18 | Ht 63.0 in | Wt 140.1 lb

## 2020-02-27 DIAGNOSIS — G4700 Insomnia, unspecified: Secondary | ICD-10-CM | POA: Diagnosis not present

## 2020-02-27 DIAGNOSIS — G3184 Mild cognitive impairment, so stated: Secondary | ICD-10-CM | POA: Diagnosis not present

## 2020-02-27 DIAGNOSIS — M549 Dorsalgia, unspecified: Secondary | ICD-10-CM

## 2020-02-27 DIAGNOSIS — R630 Anorexia: Secondary | ICD-10-CM | POA: Insufficient documentation

## 2020-02-27 DIAGNOSIS — Z7689 Persons encountering health services in other specified circumstances: Secondary | ICD-10-CM | POA: Diagnosis not present

## 2020-02-27 DIAGNOSIS — E785 Hyperlipidemia, unspecified: Secondary | ICD-10-CM | POA: Diagnosis not present

## 2020-02-27 DIAGNOSIS — N184 Chronic kidney disease, stage 4 (severe): Secondary | ICD-10-CM | POA: Diagnosis not present

## 2020-02-27 DIAGNOSIS — I10 Essential (primary) hypertension: Secondary | ICD-10-CM | POA: Diagnosis not present

## 2020-02-27 DIAGNOSIS — Z950 Presence of cardiac pacemaker: Secondary | ICD-10-CM

## 2020-02-27 DIAGNOSIS — M810 Age-related osteoporosis without current pathological fracture: Secondary | ICD-10-CM

## 2020-02-27 DIAGNOSIS — I700 Atherosclerosis of aorta: Secondary | ICD-10-CM

## 2020-02-27 DIAGNOSIS — I350 Nonrheumatic aortic (valve) stenosis: Secondary | ICD-10-CM | POA: Diagnosis not present

## 2020-02-27 DIAGNOSIS — G8929 Other chronic pain: Secondary | ICD-10-CM

## 2020-02-27 MED ORDER — MIRTAZAPINE 15 MG PO TABS: 15.0000 mg | ORAL_TABLET | Freq: Every day | ORAL | 2 refills | Status: AC
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
## 2020-02-27 NOTE — Assessment & Plan Note (Signed)
On Donepezil 

## 2020-02-27 NOTE — Assessment & Plan Note (Signed)
Progressive CKD Needs to follow up with Nephrology Check BMP before the next visit Avoid nephrotoxic agents Lisinopril for proteinuria On Vitamin D supplements

## 2020-02-27 NOTE — Assessment & Plan Note (Addendum)
Care established History and medications reviewed with the patient 

## 2020-02-27 NOTE — Assessment & Plan Note (Signed)
On Simvastatin Check lipid profile before the next visit

## 2020-02-27 NOTE — Assessment & Plan Note (Signed)
Could be multifactorial Age related and/or CKD and/or due to chronic nausea Denies anhedonia or decreased activity Started Remeron

## 2020-02-27 NOTE — Patient Instructions (Signed)
Please continue taking medications regularly.  Please avoid skipping any meal. Try to have small, frequent meals.  Please contact your Nephrologist to schedule an appointment.  Please continue to take Ensure supplements.  You are being scheduled to get Ultrasound of the kidneys.

## 2020-02-27 NOTE — Assessment & Plan Note (Signed)
Has less appetite as well Started Remeron

## 2020-02-27 NOTE — Telephone Encounter (Signed)
Called pt with Renal U/S appt info. It's scheduled for 2/22 @ 12:15 p @ [facility] and is to arrive with a full bladder. Told pt to call us back to confirm she received my message.

## 2020-02-27 NOTE — Assessment & Plan Note (Signed)
BP Readings from Last 1 Encounters:  02/27/20 (!) 145/90   Elevated, could be due to underlying back pain Continue Lisinopril and Amlodipine, would avoid aggressive blood pressure control for her due to her age 87 for compliance with the medications Advised DASH diet and moderate exercise/walking as tolerated

## 2020-02-27 NOTE — Assessment & Plan Note (Signed)
On Simvastatin 20 mg QD

## 2020-02-27 NOTE — Assessment & Plan Note (Signed)
Appears to be right flank pain Check US renal to r/o renal etiology If renal US negative and pain found to be MSK related pain, will refer for PT.

## 2020-02-27 NOTE — Assessment & Plan Note (Signed)
Has h/o sick sinus syndrome Follows up with Cardiology

## 2020-02-27 NOTE — Assessment & Plan Note (Signed)
On Vitamin D supplements Will plan to check DEXA scan and start bisphosphonate if necessary

## 2020-02-29 ENCOUNTER — Other Ambulatory Visit (HOSPITAL_COMMUNITY): Payer: PPO

## 2020-03-04 ENCOUNTER — Other Ambulatory Visit: Payer: Self-pay

## 2020-03-04 ENCOUNTER — Ambulatory Visit (HOSPITAL_COMMUNITY)
Admission: RE | Admit: 2020-03-04 | Discharge: 2020-03-04 | Disposition: A | Discharge status: Home / Self Care | Payer: PPO | Source: Ambulatory Visit | Attending: Internal Medicine | Admitting: Internal Medicine

## 2020-03-04 DIAGNOSIS — N184 Chronic kidney disease, stage 4 (severe): Secondary | ICD-10-CM | POA: Diagnosis not present

## 2020-03-04 DIAGNOSIS — N189 Chronic kidney disease, unspecified: Secondary | ICD-10-CM | POA: Diagnosis not present

## 2020-03-04 DIAGNOSIS — N281 Cyst of kidney, acquired: Secondary | ICD-10-CM | POA: Diagnosis not present

## 2020-03-10 DIAGNOSIS — E782 Mixed hyperlipidemia: Secondary | ICD-10-CM | POA: Diagnosis not present

## 2020-03-10 DIAGNOSIS — R7301 Impaired fasting glucose: Secondary | ICD-10-CM | POA: Diagnosis not present

## 2020-03-10 DIAGNOSIS — G3184 Mild cognitive impairment, so stated: Secondary | ICD-10-CM | POA: Diagnosis not present

## 2020-03-10 DIAGNOSIS — F17200 Nicotine dependence, unspecified, uncomplicated: Secondary | ICD-10-CM | POA: Diagnosis not present

## 2020-03-10 DIAGNOSIS — N3281 Overactive bladder: Secondary | ICD-10-CM | POA: Diagnosis not present

## 2020-03-10 DIAGNOSIS — I350 Nonrheumatic aortic (valve) stenosis: Secondary | ICD-10-CM | POA: Diagnosis not present

## 2020-03-10 DIAGNOSIS — I129 Hypertensive chronic kidney disease with stage 1 through stage 4 chronic kidney disease, or unspecified chronic kidney disease: Secondary | ICD-10-CM | POA: Diagnosis not present

## 2020-03-25 NOTE — Patient Instructions (Signed)
Your procedure is scheduled on: 04/01/2020  Report to [facility] at   7:15 AM.  Call this number if you have problems the morning of surgery: [phone]   Remember:              Follow Directions on the letter you received from Your Physician's office regarding the Bowel Prep              No Smoking the day of Procedure :   Take these medicines the morning of surgery with A SIP OF WATER: Amlodipine   Tramadol if needed   Do not wear jewelry, make-up or nail polish.    Do not bring valuables to the hospital.  Contacts, dentures or bridgework may not be worn into surgery.  .   Patients discharged the day of surgery will not be allowed to drive home.     Colonoscopy, Adult, Care After This sheet gives you information about how to care for yourself after your procedure. Your health care provider may also give you more specific instructions. If you have problems or questions, contact your health care provider. What can I expect after the procedure? After the procedure, it is common to have:  A small amount of blood in your stool for 24 hours after the procedure.  Some gas.  Mild abdominal cramping or bloating.  Follow these instructions at home: General instructions   For the first 24 hours after the procedure: ? Do not drive or use machinery. ? Do not sign important documents. ? Do not drink alcohol. ? Do your regular daily activities at a slower pace than normal. ? Eat soft, easy-to-digest foods. ? Rest often.  Take over-the-counter or prescription medicines only as told by your health care provider.  It is up to you to get the results of your procedure. Ask your health care provider, or the department performing the procedure, when your results will be ready. Relieving cramping and bloating  Try walking around when you have cramps or feel bloated.  Apply heat to your abdomen as told by your health care provider. Use a heat source that your health care provider  recommends, such as a moist heat pack or a heating pad. ? Place a towel between your skin and the heat source. ? Leave the heat on for 20-30 minutes. ? Remove the heat if your skin turns bright red. This is especially important if you are unable to feel pain, heat, or cold. You may have a greater risk of getting burned. Eating and drinking  Drink enough fluid to keep your urine clear or pale yellow.  Resume your normal diet as instructed by your health care provider. Avoid heavy or fried foods that are hard to digest.  Avoid drinking alcohol for as long as instructed by your health care provider. Contact a health care provider if:  You have blood in your stool 2-3 days after the procedure. Get help right away if:  You have more than a small spotting of blood in your stool.  You pass large blood clots in your stool.  Your abdomen is swollen.  You have nausea or vomiting.  You have a fever.  You have increasing abdominal pain that is not relieved with medicine. This information is not intended to replace advice given to you by your health care provider. Make sure you discuss any questions you have with your health care provider. Document Released: 08/12/2003 Document Revised: 09/22/2015 Document Reviewed: 03/11/2015 Elsevier Interactive Patient Education  2018 [place].

## 2020-03-27 ENCOUNTER — Encounter (HOSPITAL_COMMUNITY)
Admission: RE | Admit: 2020-03-27 | Discharge: 2020-03-27 | Disposition: A | Discharge status: Home / Self Care | Payer: PPO | Source: Ambulatory Visit | Attending: Internal Medicine | Admitting: Internal Medicine

## 2020-03-27 ENCOUNTER — Other Ambulatory Visit: Payer: Self-pay

## 2020-03-27 ENCOUNTER — Encounter (HOSPITAL_COMMUNITY): Payer: PPO

## 2020-03-27 DIAGNOSIS — Z01812 Encounter for preprocedural laboratory examination: Secondary | ICD-10-CM | POA: Insufficient documentation

## 2020-03-27 LAB — CBC WITH DIFFERENTIAL/PLATELET
Abs Immature Granulocytes: 0.01 10*3/uL (ref 0.00–0.07)
Basophils Absolute: 0.1 10*3/uL (ref 0.0–0.1)
Basophils Relative: 1 %
Eosinophils Absolute: 1 10*3/uL — ABNORMAL HIGH (ref 0.0–0.5)
Eosinophils Relative: 16 %
HCT: 41.2 % (ref 36.0–46.0)
Hemoglobin: 13.1 g/dL (ref 12.0–15.0)
Immature Granulocytes: 0 %
Lymphocytes Relative: 23 %
Lymphs Abs: 1.5 10*3/uL (ref 0.7–4.0)
MCH: 29.9 pg (ref 26.0–34.0)
MCHC: 31.8 g/dL (ref 30.0–36.0)
MCV: 94.1 fL (ref 80.0–100.0)
Monocytes Absolute: 0.5 10*3/uL (ref 0.1–1.0)
Monocytes Relative: 8 %
Neutro Abs: 3.5 10*3/uL (ref 1.7–7.7)
Neutrophils Relative %: 52 %
Platelets: 146 10*3/uL — ABNORMAL LOW (ref 150–400)
RBC: 4.38 MIL/uL (ref 3.87–5.11)
RDW: 13.5 % (ref 11.5–15.5)
WBC: 6.6 10*3/uL (ref 4.0–10.5)
nRBC: 0 % (ref 0.0–0.2)

## 2020-03-27 LAB — BASIC METABOLIC PANEL
Anion gap: 4 — ABNORMAL LOW (ref 5–15)
BUN: 30 mg/dL — ABNORMAL HIGH (ref 8–23)
CO2: 21 mmol/L — ABNORMAL LOW (ref 22–32)
Calcium: 9 mg/dL (ref 8.9–10.3)
Chloride: 116 mmol/L — ABNORMAL HIGH (ref 98–111)
Creatinine, Ser: 2.08 mg/dL — ABNORMAL HIGH (ref 0.44–1.00)
GFR, Estimated: 23 mL/min — ABNORMAL LOW (ref >60)
Glucose, Bld: 101 mg/dL — ABNORMAL HIGH (ref 70–99)
Potassium: 3.8 mmol/L (ref 3.5–5.1)
Sodium: 141 mmol/L (ref 135–145)

## 2020-03-28 ENCOUNTER — Other Ambulatory Visit (HOSPITAL_COMMUNITY): Payer: PPO

## 2020-03-28 ENCOUNTER — Other Ambulatory Visit (HOSPITAL_COMMUNITY)
Admission: RE | Admit: 2020-03-28 | Discharge: 2020-03-28 | Disposition: A | Discharge status: Home / Self Care | Payer: PPO | Source: Ambulatory Visit | Attending: Internal Medicine | Admitting: Internal Medicine

## 2020-03-28 DIAGNOSIS — Z01812 Encounter for preprocedural laboratory examination: Secondary | ICD-10-CM | POA: Diagnosis not present

## 2020-03-28 DIAGNOSIS — Z20822 Contact with and (suspected) exposure to covid-19: Secondary | ICD-10-CM | POA: Diagnosis not present

## 2020-03-28 LAB — SARS CORONAVIRUS 2 (TAT 6-24 HRS): SARS Coronavirus 2: NEGATIVE

## 2020-04-01 ENCOUNTER — Ambulatory Visit (HOSPITAL_COMMUNITY): Payer: PPO | Admitting: Certified Registered"

## 2020-04-01 ENCOUNTER — Encounter (HOSPITAL_COMMUNITY): Payer: Self-pay

## 2020-04-01 ENCOUNTER — Ambulatory Visit (HOSPITAL_COMMUNITY)
Admission: RE | Admit: 2020-04-01 | Discharge: 2020-04-01 | Disposition: A | Discharge status: Home / Self Care | Payer: PPO | Attending: Internal Medicine | Admitting: Internal Medicine

## 2020-04-01 ENCOUNTER — Encounter (HOSPITAL_COMMUNITY)
Admission: RE | Disposition: A | Discharge status: Home / Self Care | Payer: Self-pay | Source: Home / Self Care | Attending: Internal Medicine

## 2020-04-01 DIAGNOSIS — Z86010 Personal history of colonic polyps: Secondary | ICD-10-CM | POA: Insufficient documentation

## 2020-04-01 DIAGNOSIS — Z8371 Family history of colonic polyps: Secondary | ICD-10-CM | POA: Diagnosis not present

## 2020-04-01 DIAGNOSIS — Z9049 Acquired absence of other specified parts of digestive tract: Secondary | ICD-10-CM | POA: Insufficient documentation

## 2020-04-01 DIAGNOSIS — Z09 Encounter for follow-up examination after completed treatment for conditions other than malignant neoplasm: Secondary | ICD-10-CM | POA: Insufficient documentation

## 2020-04-01 DIAGNOSIS — K648 Other hemorrhoids: Secondary | ICD-10-CM | POA: Insufficient documentation

## 2020-04-01 DIAGNOSIS — K5730 Diverticulosis of large intestine without perforation or abscess without bleeding: Secondary | ICD-10-CM | POA: Insufficient documentation

## 2020-04-01 DIAGNOSIS — K529 Noninfective gastroenteritis and colitis, unspecified: Secondary | ICD-10-CM | POA: Diagnosis not present

## 2020-04-01 DIAGNOSIS — Z950 Presence of cardiac pacemaker: Secondary | ICD-10-CM | POA: Diagnosis not present

## 2020-04-01 DIAGNOSIS — F17210 Nicotine dependence, cigarettes, uncomplicated: Secondary | ICD-10-CM | POA: Diagnosis not present

## 2020-04-01 DIAGNOSIS — Z980 Intestinal bypass and anastomosis status: Secondary | ICD-10-CM | POA: Insufficient documentation

## 2020-04-01 DIAGNOSIS — Z79899 Other long term (current) drug therapy: Secondary | ICD-10-CM | POA: Diagnosis not present

## 2020-04-01 DIAGNOSIS — J449 Chronic obstructive pulmonary disease, unspecified: Secondary | ICD-10-CM | POA: Diagnosis not present

## 2020-04-01 HISTORY — PX: COLONOSCOPY WITH PROPOFOL: SHX5780

## 2020-04-01 SURGERY — COLONOSCOPY WITH PROPOFOL
Anesthesia: General

## 2020-04-01 MED ORDER — STERILE WATER FOR IRRIGATION IR SOLN
Status: DC | PRN
  Administered 2020-04-01: 200 mL

## 2020-04-01 MED ORDER — PROPOFOL 10 MG/ML IV BOLUS
INTRAVENOUS | Status: DC | PRN
  Administered 2020-04-01 (×2): 30 mg via INTRAVENOUS
  Administered 2020-04-01: 100 mg via INTRAVENOUS

## 2020-04-01 MED ORDER — LIDOCAINE HCL (CARDIAC) PF 100 MG/5ML IV SOSY
PREFILLED_SYRINGE | INTRAVENOUS | Status: DC | PRN
  Administered 2020-04-01: 50 mg via INTRAVENOUS

## 2020-04-01 MED ORDER — LACTATED RINGERS IV SOLN: INTRAVENOUS | Status: DC | PRN

## 2020-04-01 NOTE — Discharge Instructions (Signed)
  Colonoscopy Discharge Instructions  Read the instructions outlined below and refer to this sheet in the next few weeks. These discharge instructions provide you with general information on caring for yourself after you leave the hospital. Your doctor may also give you specific instructions. While your treatment has been planned according to the most current medical practices available, unavoidable complications occasionally occur.   ACTIVITY  You may resume your regular activity, but move at a slower pace for the next 24 hours.   Take frequent rest periods for the next 24 hours.   Walking will help get rid of the air and reduce the bloated feeling in your belly (abdomen).   No driving for 24 hours (because of the medicine (anesthesia) used during the test).    Do not sign any important legal documents or operate any machinery for 24 hours (because of the anesthesia used during the test).  NUTRITION  Drink plenty of fluids.   You may resume your normal diet as instructed by your doctor.   Begin with a light meal and progress to your normal diet. Heavy or fried foods are harder to digest and may make you feel sick to your stomach (nauseated).   Avoid alcoholic beverages for 24 hours or as instructed.  MEDICATIONS  You may resume your normal medications unless your doctor tells you otherwise.  WHAT YOU CAN EXPECT TODAY  Some feelings of bloating in the abdomen.   Passage of more gas than usual.   Spotting of blood in your stool or on the toilet paper.  IF YOU HAD POLYPS REMOVED DURING THE COLONOSCOPY:  No aspirin products for 7 days or as instructed.   No alcohol for 7 days or as instructed.   Eat a soft diet for the next 24 hours.  FINDING OUT THE RESULTS OF YOUR TEST Not all test results are available during your visit. If your test results are not back during the visit, make an appointment with your caregiver to find out the results. Do not assume everything is normal if  you have not heard from your caregiver or the medical facility. It is important for you to follow up on all of your test results.  SEEK IMMEDIATE MEDICAL ATTENTION IF:  You have more than a spotting of blood in your stool.   Your belly is swollen (abdominal distention).   You are nauseated or vomiting.   You have a temperature over 101.   You have abdominal pain or discomfort that is severe or gets worse throughout the day.   Your colonoscopy was relatively unremarkable.  The previously noted surgical site was healthy.  I did not find any polyps or evidence of colon cancer.  Due to your age, I do not think we need to repeat this in the future.  Follow-up with GI as needed.  I hope you have a great rest of your week!  Elon Alas. Abbey Chatters, D.O. Gastroenterology and Hepatology [facility]
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
## 2020-04-01 NOTE — Anesthesia Procedure Notes (Signed)
Date/Time: 04/01/2020 9:21 AM Performed by: Orlie Dakin, CRNA Pre-anesthesia Checklist: Patient identified, Suction available, Emergency Drugs available and Patient being monitored Patient Re-evaluated:Patient Re-evaluated prior to induction Oxygen Delivery Method: Nasal cannula Induction Type: IV induction Placement Confirmation: positive ETCO2

## 2020-04-01 NOTE — Op Note (Signed)
[facility] Patient Name: Ashley Olsen Procedure Date: 04/01/2020 9:09 AM MRN: [id number] Date of Birth: June 12, 1932 Attending MD: Elon Alas. Abbey Chatters DO CSN: [id number] Age: 87 Admit Type: Outpatient Procedure:                Colonoscopy Indications:              Abnormal CT of the GI tract, Follow-up of colitis Providers:                Elon Alas. Abbey Chatters, DO, Crystal Page, Randa Spike, Technician Referring MD:              Medicines:                See the Anesthesia note for documentation of the                            administered medications Complications:            No immediate complications. Estimated Blood Loss:     Estimated blood loss: none. Procedure:                Pre-Anesthesia Assessment:                           - The anesthesia plan was to use monitored                            anesthesia care (MAC).                           After obtaining informed consent, the colonoscope                            was passed under direct vision. Throughout the                            procedure, the patient's blood pressure, pulse, and                            oxygen saturations were monitored continuously. The                            [id number]-[id number] ([id number]) scope was introduced through                            the anus and advanced to the the cecum, identified                            by appendiceal orifice and ileocecal valve. The                            colonoscopy was performed without difficulty. The                            patient tolerated the procedure  well. The quality                            of the bowel preparation was evaluated using the                            BBPS [facility] Bowel Preparation Scale) with scores                            of: Right Colon = 3, Transverse Colon = 3 and Left                            Colon = 3 (entire mucosa seen well with no residual                            staining,  small fragments of stool or opaque                            liquid). The total BBPS score equals 9. Scope In: 9:20:13 AM Scope Out: 9:31:37 AM Scope Withdrawal Time: 0 hours 6 minutes 6 seconds  Total Procedure Duration: 0 hours 11 minutes 24 seconds  Findings:      The perianal and digital rectal examinations were normal.      Non-bleeding internal hemorrhoids were found during endoscopy.      Multiple small and large-mouthed diverticula were found in the sigmoid       colon.      There was evidence of a prior end-to-side colo-colonic anastomosis in       the sigmoid colon. This was patent and was characterized by healthy       appearing mucosa. The anastomosis was traversed.      A tattoo was seen in the ascending colon. The tattoo site appeared       normal. Impression:               - Non-bleeding internal hemorrhoids.                           - Diverticulosis in the sigmoid colon.                           - Patent end-to-side colo-colonic anastomosis,                            characterized by healthy appearing mucosa.                           - A tattoo was seen in the ascending colon. The                            tattoo site appeared normal.                           - No specimens collected. Moderate Sedation:      Per Anesthesia Care Recommendation:           - Patient has a contact number available for  emergencies. The signs and symptoms of potential                            delayed complications were discussed with the                            patient. Return to normal activities tomorrow.                            Written discharge instructions were provided to the                            patient.                           - Resume previous diet.                           - Continue present medications.                           - No repeat colonoscopy due to age.                           - Return to GI clinic PRN. Procedure  Code(s):        --- Professional ---                           [phone], Colonoscopy, flexible; diagnostic, including                            collection of specimen(s) by brushing or washing,                            when performed (separate procedure) Diagnosis Code(s):        --- Professional ---                           K64.8, Other hemorrhoids                           Z98.0, Intestinal bypass and anastomosis status                           K52.9, Noninfective gastroenteritis and colitis,                            unspecified                           K57.30, Diverticulosis of large intestine without                            perforation or abscess without bleeding                           R93.3, Abnormal findings on diagnostic imaging of  other parts of digestive tract CPT copyright 2019 American Medical Association. All rights reserved. The codes documented in this report are preliminary and upon coder review may  be revised to meet current compliance requirements. Elon Alas. Abbey Chatters, DO [place] Abbey Chatters, DO 04/01/2020 9:36:14 AM This report has been signed electronically. Number of Addenda: 0

## 2020-04-01 NOTE — Anesthesia Preprocedure Evaluation (Signed)
Anesthesia Evaluation  Patient identified by MRN, date of birth, ID band Patient awake    Reviewed: Allergy & Precautions, H&P , NPO status , Patient's Chart, lab work & pertinent test results, reviewed documented beta blocker date and time   Airway Mallampati: II  TM Distance: >3 FB Neck ROM: full    Dental no notable dental hx.    Pulmonary COPD, Current Smoker and Patient abstained from smoking.,    Pulmonary exam normal breath sounds clear to auscultation       Cardiovascular Exercise Tolerance: Good hypertension, + pacemaker + Valvular Problems/Murmurs  Rhythm:regular Rate:Normal     Neuro/Psych negative neurological ROS  negative psych ROS   GI/Hepatic Neg liver ROS, hiatal hernia,   Endo/Other  negative endocrine ROS  Renal/GU CRFRenal disease  negative genitourinary   Musculoskeletal   Abdominal   Peds  Hematology  (+) Blood dyscrasia, anemia ,   Anesthesia Other Findings   Reproductive/Obstetrics negative OB ROS                             Anesthesia Physical Anesthesia Plan  ASA: III  Anesthesia Plan: General   Post-op Pain Management:    Induction:   PONV Risk Score and Plan: Propofol infusion  Airway Management Planned:   Additional Equipment:   Intra-op Plan:   Post-operative Plan:   Informed Consent: I have reviewed the patients History and Physical, chart, labs and discussed the procedure including the risks, benefits and alternatives for the proposed anesthesia with the patient or authorized representative who has indicated his/her understanding and acceptance.     Dental Advisory Given  Plan Discussed with: CRNA  Anesthesia Plan Comments:         Anesthesia Quick Evaluation

## 2020-04-01 NOTE — Transfer of Care (Signed)
Immediate Anesthesia Transfer of Care Note  Patient: Ashley Olsen  Procedure(s) Performed: COLONOSCOPY WITH PROPOFOL (N/A )  Patient Location: PACU  Anesthesia Type:General  Level of Consciousness: awake and oriented  Airway & Oxygen Therapy: Patient Spontanous Breathing  Post-op Assessment: Report given to RN and Post -op Vital signs reviewed and stable  Post vital signs: Reviewed and stable  Last Vitals:  Vitals Value Taken Time  BP    Temp    Pulse    Resp    SpO2      Last Pain:  Vitals:   04/01/20 0915  PainSc: 0-No pain      Patients Stated Pain Goal: 7 (31/12/16 2446)  Complications: No complications documented.

## 2020-04-01 NOTE — Anesthesia Postprocedure Evaluation (Signed)
Anesthesia Post Note  Patient: Ashley Olsen  Procedure(s) Performed: COLONOSCOPY WITH PROPOFOL (N/A )  Patient location during evaluation: PACU Anesthesia Type: General Level of consciousness: awake Pain management: pain level controlled Vital Signs Assessment: post-procedure vital signs reviewed and stable Respiratory status: spontaneous breathing and respiratory function stable Cardiovascular status: blood pressure returned to baseline and stable Postop Assessment: no headache and no apparent nausea or vomiting Anesthetic complications: no   No complications documented.   Last Vitals:  Vitals:   04/01/20 0945 04/01/20 0953  BP: 128/73 (!) 154/77  Pulse:  63  Resp:  18  Temp:  36.6 C  SpO2:  99%    Last Pain:  Vitals:   04/01/20 0953  TempSrc: Oral  PainSc: 0-No pain                 [name]

## 2020-04-08 ENCOUNTER — Encounter: Payer: Self-pay | Admitting: Internal Medicine

## 2020-04-08 ENCOUNTER — Other Ambulatory Visit: Payer: Self-pay

## 2020-04-08 ENCOUNTER — Ambulatory Visit: Payer: PPO | Admitting: Internal Medicine

## 2020-04-08 VITALS — BP 160/88 | HR 68 | Ht 63.0 in | Wt 143.0 lb

## 2020-04-08 DIAGNOSIS — I495 Sick sinus syndrome: Secondary | ICD-10-CM | POA: Diagnosis not present

## 2020-04-08 LAB — CUP PACEART INCLINIC DEVICE CHECK
Battery Remaining Longevity: 120 mo
Battery Voltage: 2.99 V
Brady Statistic RA Percent Paced: 79 %
Brady Statistic RV Percent Paced: 5.3 %
Date Time Interrogation Session: 20220329094951
Implantable Lead Implant Date: 20161013
Implantable Lead Implant Date: 20161013
Implantable Lead Location: 753859
Implantable Lead Location: 753860
Implantable Pulse Generator Implant Date: 20161013
Lead Channel Impedance Value: 387.5 Ohm
Lead Channel Impedance Value: 425 Ohm
Lead Channel Pacing Threshold Amplitude: 0.75 V
Lead Channel Pacing Threshold Amplitude: 0.75 V
Lead Channel Pacing Threshold Pulse Width: 0.5 ms
Lead Channel Pacing Threshold Pulse Width: 0.5 ms
Lead Channel Sensing Intrinsic Amplitude: 1.9 mV
Lead Channel Sensing Intrinsic Amplitude: 12 mV
Lead Channel Setting Pacing Amplitude: 2 V
Lead Channel Setting Pacing Amplitude: 2.5 V
Lead Channel Setting Pacing Pulse Width: 0.5 ms
Lead Channel Setting Sensing Sensitivity: 2 mV
Pulse Gen Model: 2240
Pulse Gen Serial Number: 7820884

## 2020-04-08 NOTE — Patient Instructions (Signed)
Medication Instructions:  Your physician recommends that you continue on your current medications as directed. Please refer to the Current Medication list given to you today.  *If you need a refill on your cardiac medications before your next appointment, please call your pharmacy*   Lab Work: NONE   If you have labs (blood work) drawn today and your tests are completely normal, you will receive your results only by: Marland Kitchen MyChart Message (if you have MyChart) OR . A paper copy in the mail If you have any lab test that is abnormal or we need to change your treatment, we will call you to review the results.   Testing/Procedures: Your physician has requested that you have an echocardiogram. Echocardiography is a painless test that uses sound waves to create images of your heart. It provides your doctor with information about the size and shape of your heart and how well your heart's chambers and valves are working. This procedure takes approximately one hour. There are no restrictions for this procedure. To be done in 1 year.      Follow-Up: At [facility], you and your health needs are our priority.  As part of our continuing mission to provide you with exceptional heart care, we have created designated Provider Care Teams.  These Care Teams include your primary Cardiologist (physician) and Advanced Practice Providers (APPs -  Physician Assistants and Nurse Practitioners) who all work together to provide you with the care you need, when you need it.  We recommend signing up for the patient portal called "MyChart".  Sign up information is provided on this After Visit Summary.  MyChart is used to connect with patients for Virtual Visits (Telemedicine).  Patients are able to view lab/test results, encounter notes, upcoming appointments, etc.  Non-urgent messages can be sent to your provider as well.   To learn more about what you can do with MyChart, go to [email or web address].    Your next  appointment:   1 year(s)  The format for your next appointment:   In Person  Provider:   Cristopher Peru, MD   Other Instructions Thank you for choosing [facility]!
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
## 2020-04-09 ENCOUNTER — Ambulatory Visit (INDEPENDENT_AMBULATORY_CARE_PROVIDER_SITE_OTHER): Payer: PPO

## 2020-04-09 ENCOUNTER — Encounter (HOSPITAL_COMMUNITY): Payer: Self-pay | Admitting: Internal Medicine

## 2020-04-09 DIAGNOSIS — I495 Sick sinus syndrome: Secondary | ICD-10-CM

## 2020-04-09 DIAGNOSIS — G3184 Mild cognitive impairment, so stated: Secondary | ICD-10-CM | POA: Diagnosis not present

## 2020-04-09 DIAGNOSIS — R32 Unspecified urinary incontinence: Secondary | ICD-10-CM | POA: Diagnosis not present

## 2020-04-09 DIAGNOSIS — I129 Hypertensive chronic kidney disease with stage 1 through stage 4 chronic kidney disease, or unspecified chronic kidney disease: Secondary | ICD-10-CM | POA: Diagnosis not present

## 2020-04-09 DIAGNOSIS — M810 Age-related osteoporosis without current pathological fracture: Secondary | ICD-10-CM | POA: Diagnosis not present

## 2020-04-09 DIAGNOSIS — E785 Hyperlipidemia, unspecified: Secondary | ICD-10-CM | POA: Diagnosis not present

## 2020-04-09 DIAGNOSIS — R110 Nausea: Secondary | ICD-10-CM | POA: Diagnosis not present

## 2020-04-09 DIAGNOSIS — R531 Weakness: Secondary | ICD-10-CM | POA: Diagnosis not present

## 2020-04-09 LAB — CUP PACEART REMOTE DEVICE CHECK
Battery Remaining Longevity: 109 mo
Battery Remaining Percentage: 95.5 %
Battery Voltage: 2.99 V
Brady Statistic AP VP Percent: 5.1 %
Brady Statistic AP VS Percent: 89 %
Brady Statistic AS VP Percent: 1 %
Brady Statistic AS VS Percent: 6.1 %
Brady Statistic RA Percent Paced: 93 %
Brady Statistic RV Percent Paced: 5.2 %
Date Time Interrogation Session: 20220330020015
Implantable Lead Implant Date: 20161013
Implantable Lead Implant Date: 20161013
Implantable Lead Location: 753859
Implantable Lead Location: 753860
Implantable Pulse Generator Implant Date: 20161013
Lead Channel Impedance Value: 390 Ohm
Lead Channel Impedance Value: 430 Ohm
Lead Channel Pacing Threshold Amplitude: 0.75 V
Lead Channel Pacing Threshold Amplitude: 0.75 V
Lead Channel Pacing Threshold Pulse Width: 0.5 ms
Lead Channel Pacing Threshold Pulse Width: 0.5 ms
Lead Channel Sensing Intrinsic Amplitude: 1.9 mV
Lead Channel Sensing Intrinsic Amplitude: 12 mV
Lead Channel Setting Pacing Amplitude: 2 V
Lead Channel Setting Pacing Amplitude: 2.5 V
Lead Channel Setting Pacing Pulse Width: 0.5 ms
Lead Channel Setting Sensing Sensitivity: 2 mV
Pulse Gen Model: 2240
Pulse Gen Serial Number: 7820884

## 2020-04-11 DIAGNOSIS — Z79899 Other long term (current) drug therapy: Secondary | ICD-10-CM | POA: Diagnosis not present

## 2020-04-11 DIAGNOSIS — I129 Hypertensive chronic kidney disease with stage 1 through stage 4 chronic kidney disease, or unspecified chronic kidney disease: Secondary | ICD-10-CM | POA: Diagnosis not present

## 2020-04-11 DIAGNOSIS — D631 Anemia in chronic kidney disease: Secondary | ICD-10-CM | POA: Diagnosis not present

## 2020-04-11 DIAGNOSIS — N184 Chronic kidney disease, stage 4 (severe): Secondary | ICD-10-CM | POA: Diagnosis not present

## 2020-04-11 DIAGNOSIS — R809 Proteinuria, unspecified: Secondary | ICD-10-CM | POA: Diagnosis not present

## 2020-04-11 DIAGNOSIS — R42 Dizziness and giddiness: Secondary | ICD-10-CM | POA: Diagnosis not present

## 2020-04-11 DIAGNOSIS — E211 Secondary hyperparathyroidism, not elsewhere classified: Secondary | ICD-10-CM | POA: Diagnosis not present

## 2020-04-17 DIAGNOSIS — R809 Proteinuria, unspecified: Secondary | ICD-10-CM | POA: Diagnosis not present

## 2020-04-17 DIAGNOSIS — E559 Vitamin D deficiency, unspecified: Secondary | ICD-10-CM | POA: Diagnosis not present

## 2020-04-17 DIAGNOSIS — I129 Hypertensive chronic kidney disease with stage 1 through stage 4 chronic kidney disease, or unspecified chronic kidney disease: Secondary | ICD-10-CM | POA: Diagnosis not present

## 2020-04-17 DIAGNOSIS — N184 Chronic kidney disease, stage 4 (severe): Secondary | ICD-10-CM | POA: Diagnosis not present

## 2020-04-22 NOTE — Progress Notes (Signed)
Remote pacemaker transmission.   

## 2020-05-23 DIAGNOSIS — M1990 Unspecified osteoarthritis, unspecified site: Secondary | ICD-10-CM | POA: Diagnosis not present

## 2020-05-23 DIAGNOSIS — I10 Essential (primary) hypertension: Secondary | ICD-10-CM | POA: Diagnosis not present

## 2020-05-23 DIAGNOSIS — R4189 Other symptoms and signs involving cognitive functions and awareness: Secondary | ICD-10-CM | POA: Diagnosis not present

## 2020-05-26 ENCOUNTER — Ambulatory Visit: Payer: PPO | Admitting: Internal Medicine

## 2020-06-05 ENCOUNTER — Other Ambulatory Visit: Payer: Self-pay

## 2020-06-12 DIAGNOSIS — E782 Mixed hyperlipidemia: Secondary | ICD-10-CM | POA: Diagnosis not present

## 2020-06-12 DIAGNOSIS — I129 Hypertensive chronic kidney disease with stage 1 through stage 4 chronic kidney disease, or unspecified chronic kidney disease: Secondary | ICD-10-CM | POA: Diagnosis not present

## 2020-06-17 ENCOUNTER — Telehealth: Payer: Self-pay | Admitting: Internal Medicine

## 2020-06-17 NOTE — Telephone Encounter (Signed)
Tried calling pt to  schedule Medicare Annual Wellness Visit (AWV) either virtually or in office.  No answer  AWV-S PER PALMETTO 04/11/16  please schedule at anytime with [facility]  health coach  This should be a 40 minute visit.

## 2020-06-19 DIAGNOSIS — R413 Other amnesia: Secondary | ICD-10-CM | POA: Diagnosis not present

## 2020-06-19 DIAGNOSIS — I350 Nonrheumatic aortic (valve) stenosis: Secondary | ICD-10-CM | POA: Diagnosis not present

## 2020-06-19 DIAGNOSIS — M1990 Unspecified osteoarthritis, unspecified site: Secondary | ICD-10-CM | POA: Diagnosis not present

## 2020-06-19 DIAGNOSIS — F17200 Nicotine dependence, unspecified, uncomplicated: Secondary | ICD-10-CM | POA: Diagnosis not present

## 2020-06-19 DIAGNOSIS — N184 Chronic kidney disease, stage 4 (severe): Secondary | ICD-10-CM | POA: Diagnosis not present

## 2020-06-19 DIAGNOSIS — R7301 Impaired fasting glucose: Secondary | ICD-10-CM | POA: Diagnosis not present

## 2020-06-19 DIAGNOSIS — K5792 Diverticulitis of intestine, part unspecified, without perforation or abscess without bleeding: Secondary | ICD-10-CM | POA: Diagnosis not present

## 2020-06-19 DIAGNOSIS — I129 Hypertensive chronic kidney disease with stage 1 through stage 4 chronic kidney disease, or unspecified chronic kidney disease: Secondary | ICD-10-CM | POA: Diagnosis not present

## 2020-06-19 DIAGNOSIS — E870 Hyperosmolality and hypernatremia: Secondary | ICD-10-CM | POA: Diagnosis not present

## 2020-06-19 DIAGNOSIS — I498 Other specified cardiac arrhythmias: Secondary | ICD-10-CM | POA: Diagnosis not present

## 2020-06-19 DIAGNOSIS — E782 Mixed hyperlipidemia: Secondary | ICD-10-CM | POA: Diagnosis not present

## 2020-06-19 DIAGNOSIS — N200 Calculus of kidney: Secondary | ICD-10-CM | POA: Diagnosis not present

## 2020-06-19 DIAGNOSIS — N170 Acute kidney failure with tubular necrosis: Secondary | ICD-10-CM | POA: Diagnosis not present

## 2020-06-19 DIAGNOSIS — N3281 Overactive bladder: Secondary | ICD-10-CM | POA: Diagnosis not present

## 2020-06-19 DIAGNOSIS — R809 Proteinuria, unspecified: Secondary | ICD-10-CM | POA: Diagnosis not present

## 2020-07-09 ENCOUNTER — Ambulatory Visit (INDEPENDENT_AMBULATORY_CARE_PROVIDER_SITE_OTHER): Payer: PPO

## 2020-07-09 DIAGNOSIS — I495 Sick sinus syndrome: Secondary | ICD-10-CM

## 2020-07-09 LAB — CUP PACEART REMOTE DEVICE CHECK
Battery Remaining Longevity: 54 mo
Battery Remaining Percentage: 51 %
Battery Voltage: 2.99 V
Brady Statistic AP VP Percent: 15 %
Brady Statistic AP VS Percent: 67 %
Brady Statistic AS VP Percent: 1 %
Brady Statistic AS VS Percent: 17 %
Brady Statistic RA Percent Paced: 82 %
Brady Statistic RV Percent Paced: 15 %
Date Time Interrogation Session: 20220629020013
Implantable Lead Implant Date: 20161013
Implantable Lead Implant Date: 20161013
Implantable Lead Location: 753859
Implantable Lead Location: 753860
Implantable Pulse Generator Implant Date: 20161013
Lead Channel Impedance Value: 360 Ohm
Lead Channel Impedance Value: 400 Ohm
Lead Channel Pacing Threshold Amplitude: 0.75 V
Lead Channel Pacing Threshold Amplitude: 0.75 V
Lead Channel Pacing Threshold Pulse Width: 0.5 ms
Lead Channel Pacing Threshold Pulse Width: 0.5 ms
Lead Channel Sensing Intrinsic Amplitude: 1.5 mV
Lead Channel Sensing Intrinsic Amplitude: 12 mV
Lead Channel Setting Pacing Amplitude: 2 V
Lead Channel Setting Pacing Amplitude: 2.5 V
Lead Channel Setting Pacing Pulse Width: 0.5 ms
Lead Channel Setting Sensing Sensitivity: 2 mV
Pulse Gen Model: 2240
Pulse Gen Serial Number: 7820884

## 2020-07-17 ENCOUNTER — Emergency Department (HOSPITAL_COMMUNITY): Payer: PPO

## 2020-07-17 ENCOUNTER — Encounter (HOSPITAL_COMMUNITY): Payer: Self-pay | Admitting: *Deleted

## 2020-07-17 ENCOUNTER — Emergency Department (HOSPITAL_COMMUNITY)
Admission: EM | Admit: 2020-07-17 | Discharge: 2020-07-17 | Disposition: A | Discharge status: Home / Self Care | Payer: PPO | Attending: Emergency Medicine | Admitting: Emergency Medicine

## 2020-07-17 ENCOUNTER — Other Ambulatory Visit: Payer: Self-pay

## 2020-07-17 DIAGNOSIS — Z79899 Other long term (current) drug therapy: Secondary | ICD-10-CM | POA: Insufficient documentation

## 2020-07-17 DIAGNOSIS — W01198A Fall on same level from slipping, tripping and stumbling with subsequent striking against other object, initial encounter: Secondary | ICD-10-CM | POA: Insufficient documentation

## 2020-07-17 DIAGNOSIS — Y92009 Unspecified place in unspecified non-institutional (private) residence as the place of occurrence of the external cause: Secondary | ICD-10-CM | POA: Diagnosis not present

## 2020-07-17 DIAGNOSIS — Z950 Presence of cardiac pacemaker: Secondary | ICD-10-CM | POA: Diagnosis not present

## 2020-07-17 DIAGNOSIS — F17210 Nicotine dependence, cigarettes, uncomplicated: Secondary | ICD-10-CM | POA: Diagnosis not present

## 2020-07-17 DIAGNOSIS — S0083XA Contusion of other part of head, initial encounter: Secondary | ICD-10-CM | POA: Insufficient documentation

## 2020-07-17 DIAGNOSIS — W19XXXA Unspecified fall, initial encounter: Secondary | ICD-10-CM

## 2020-07-17 DIAGNOSIS — J449 Chronic obstructive pulmonary disease, unspecified: Secondary | ICD-10-CM | POA: Diagnosis not present

## 2020-07-17 DIAGNOSIS — N184 Chronic kidney disease, stage 4 (severe): Secondary | ICD-10-CM | POA: Diagnosis not present

## 2020-07-17 DIAGNOSIS — M810 Age-related osteoporosis without current pathological fracture: Secondary | ICD-10-CM | POA: Diagnosis not present

## 2020-07-17 DIAGNOSIS — S0990XA Unspecified injury of head, initial encounter: Secondary | ICD-10-CM | POA: Diagnosis not present

## 2020-07-17 DIAGNOSIS — M189 Osteoarthritis of first carpometacarpal joint, unspecified: Secondary | ICD-10-CM | POA: Diagnosis not present

## 2020-07-17 DIAGNOSIS — I129 Hypertensive chronic kidney disease with stage 1 through stage 4 chronic kidney disease, or unspecified chronic kidney disease: Secondary | ICD-10-CM | POA: Insufficient documentation

## 2020-07-17 DIAGNOSIS — S61412A Laceration without foreign body of left hand, initial encounter: Secondary | ICD-10-CM | POA: Insufficient documentation

## 2020-07-17 DIAGNOSIS — S61012A Laceration without foreign body of left thumb without damage to nail, initial encounter: Secondary | ICD-10-CM | POA: Diagnosis not present

## 2020-07-17 DIAGNOSIS — S6992XA Unspecified injury of left wrist, hand and finger(s), initial encounter: Secondary | ICD-10-CM | POA: Diagnosis present

## 2020-07-17 MED ORDER — BACITRACIN ZINC 500 UNIT/GM EX OINT
TOPICAL_OINTMENT | Freq: Two times a day (BID) | CUTANEOUS | Status: DC
  Filled 2020-07-17: qty 0.9

## 2020-07-17 NOTE — ED Provider Notes (Addendum)
Emergency Medicine Provider Triage Evaluation Note  Ashley Olsen , a 87 y.o. female  was evaluated in triage.  Pt complains of fall. Slipped in the bathroom, hit her head and injured L hand.  Review of Systems  Positive: Hand pain, laceration Negative: LOC  Physical Exam  BP (!) 214/72 (BP Location: Right Arm)   Pulse (!) 58   Temp 98.6 F (37 C) (Oral)   Resp 19   Ht 5\' 3"  (1.6 m)   Wt 64.9 kg   SpO2 99%   BMI 25.33 kg/m  Gen:   Awake, no distress   HEAD:  Hematoma R forehead Resp:  Normal effort  MSK:   Moves extremities without difficulty laceration L hand Other:    Medical Decision Making  Medically screening exam initiated at 1:16 PM.  Appropriate orders placed.  Zilla AKYRA BOUCHIE was informed that the remainder of the evaluation will be completed by another provider, this initial triage assessment does not replace that evaluation, and the importance of remaining in the ED until their evaluation is complete.     Truddie Hidden, MD 07/17/20 [phone]
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
## 2020-07-17 NOTE — ED Triage Notes (Signed)
Fell at home this am, states she hit her head and has laceration to left hand. Denies LOC

## 2020-07-17 NOTE — Discharge Instructions (Addendum)
You are seen in the emergency department after your fall today.  Your CT scan and x-ray were reassuring.  There is no bleeding in your brain or broken bones in your hand.  Please keep antibiotic ointment and a clean dressing on your skin tear on your hand, and return to ER if develop any blurry vision, double vision, nausea or vomiting, confusion, or any other new severe symptoms.

## 2020-07-22 DIAGNOSIS — N184 Chronic kidney disease, stage 4 (severe): Secondary | ICD-10-CM | POA: Diagnosis not present

## 2020-07-22 DIAGNOSIS — E870 Hyperosmolality and hypernatremia: Secondary | ICD-10-CM | POA: Diagnosis not present

## 2020-07-22 DIAGNOSIS — R809 Proteinuria, unspecified: Secondary | ICD-10-CM | POA: Diagnosis not present

## 2020-07-22 DIAGNOSIS — N200 Calculus of kidney: Secondary | ICD-10-CM | POA: Diagnosis not present

## 2020-07-22 DIAGNOSIS — I129 Hypertensive chronic kidney disease with stage 1 through stage 4 chronic kidney disease, or unspecified chronic kidney disease: Secondary | ICD-10-CM | POA: Diagnosis not present

## 2020-07-22 DIAGNOSIS — N170 Acute kidney failure with tubular necrosis: Secondary | ICD-10-CM | POA: Diagnosis not present

## 2020-07-23 NOTE — Progress Notes (Signed)
Remote pacemaker transmission.   

## 2020-07-30 DIAGNOSIS — I129 Hypertensive chronic kidney disease with stage 1 through stage 4 chronic kidney disease, or unspecified chronic kidney disease: Secondary | ICD-10-CM | POA: Diagnosis not present

## 2020-07-30 DIAGNOSIS — R809 Proteinuria, unspecified: Secondary | ICD-10-CM | POA: Diagnosis not present

## 2020-07-30 DIAGNOSIS — N170 Acute kidney failure with tubular necrosis: Secondary | ICD-10-CM | POA: Diagnosis not present

## 2020-07-30 DIAGNOSIS — N200 Calculus of kidney: Secondary | ICD-10-CM | POA: Diagnosis not present

## 2020-07-30 DIAGNOSIS — N184 Chronic kidney disease, stage 4 (severe): Secondary | ICD-10-CM | POA: Diagnosis not present

## 2020-08-20 ENCOUNTER — Ambulatory Visit: Payer: PPO | Admitting: Urology

## 2020-08-20 ENCOUNTER — Encounter: Payer: Self-pay | Admitting: Urology

## 2020-08-20 ENCOUNTER — Other Ambulatory Visit: Payer: Self-pay

## 2020-08-20 VITALS — BP 174/90 | HR 72 | Ht 63.0 in | Wt 141.4 lb

## 2020-08-20 DIAGNOSIS — N393 Stress incontinence (female) (male): Secondary | ICD-10-CM | POA: Diagnosis not present

## 2020-08-20 DIAGNOSIS — N3941 Urge incontinence: Secondary | ICD-10-CM | POA: Diagnosis not present

## 2020-08-20 MED ORDER — MIRABEGRON ER 50 MG PO TB24: 50.0000 mg | ORAL_TABLET | Freq: Every day | ORAL | 0 refills | Status: DC

## 2020-08-20 NOTE — Progress Notes (Signed)
Urological Symptom Review  Patient is experiencing the following symptoms: Frequent urination Hard to postpone urination Get up at night to urinate Leakage of urine   Review of Systems  Gastrointestinal (upper)  : Negative for upper GI symptoms  Gastrointestinal (lower) : Diarrhea Constipation  Constitutional : Weight loss Fatigue  Skin: Negative for skin symptoms  Eyes: Negative for eye symptoms  Ear/Nose/Throat : Negative for Ear/Nose/Throat symptoms  Hematologic/Lymphatic: Easy bruising  Cardiovascular : Negative for cardiovascular symptoms  Respiratory : Negative for respiratory symptoms  Endocrine: Negative for endocrine symptoms  Musculoskeletal: Joint pain  Neurological: Negative for neurological symptoms  Psychologic: Negative for psychiatric symptoms

## 2020-08-20 NOTE — Patient Instructions (Signed)
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
## 2020-08-21 LAB — URINALYSIS, ROUTINE W REFLEX MICROSCOPIC
Bilirubin, UA: NEGATIVE
Glucose, UA: NEGATIVE
Ketones, UA: NEGATIVE
Leukocytes,UA: NEGATIVE
Nitrite, UA: NEGATIVE
Specific Gravity, UA: 1.025 (ref 1.005–1.030)
Urobilinogen, Ur: 0.2 mg/dL (ref 0.2–1.0)
pH, UA: 6 (ref 5.0–7.5)

## 2020-08-21 LAB — MICROSCOPIC EXAMINATION
Bacteria, UA: NONE SEEN
Renal Epithel, UA: NONE SEEN /hpf

## 2020-09-10 DIAGNOSIS — I10 Essential (primary) hypertension: Secondary | ICD-10-CM | POA: Diagnosis not present

## 2020-09-10 DIAGNOSIS — M1990 Unspecified osteoarthritis, unspecified site: Secondary | ICD-10-CM | POA: Diagnosis not present

## 2020-09-11 ENCOUNTER — Telehealth: Payer: Self-pay

## 2020-09-11 NOTE — Telephone Encounter (Signed)
Patient's daughter called to let Dr. Alyson Ingles know the sample medication he asked her to try is not working.  When she stands up her bladder empties.  Please advise.   Call back Daughter, [name] [phone]  Thanks, Helene Kelp

## 2020-09-11 NOTE — Telephone Encounter (Signed)
Please see daughter message below

## 2020-09-16 ENCOUNTER — Other Ambulatory Visit: Payer: Self-pay

## 2020-09-16 DIAGNOSIS — N3941 Urge incontinence: Secondary | ICD-10-CM

## 2020-09-16 MED ORDER — GEMTESA 75 MG PO TABS: 1.0000 | ORAL_TABLET | Freq: Every day | ORAL | 0 refills | Status: DC

## 2020-09-16 NOTE — Telephone Encounter (Signed)
Spoke with daughter- samples of gemtesa left at desk.

## 2020-09-18 DIAGNOSIS — R109 Unspecified abdominal pain: Secondary | ICD-10-CM | POA: Diagnosis not present

## 2020-09-18 DIAGNOSIS — R195 Other fecal abnormalities: Secondary | ICD-10-CM | POA: Diagnosis not present

## 2020-09-26 ENCOUNTER — Telehealth: Payer: Self-pay | Admitting: Internal Medicine

## 2020-09-26 NOTE — Telephone Encounter (Signed)
  No answer unable to leave a message for patient to call back and schedule Medicare Annual Wellness Visit (AWV) in office.   If unable to come into the office for AWV,  please offer to do virtually or by telephone.  No hx of AWV eligible for AWVI as of 04/11/2016  Please schedule at anytime with [place].      40 Minutes appointment   Any questions, please call me at [phone]

## 2020-10-01 ENCOUNTER — Other Ambulatory Visit (HOSPITAL_COMMUNITY)
Admission: RE | Admit: 2020-10-01 | Discharge: 2020-10-01 | Disposition: A | Discharge status: Home / Self Care | Payer: PPO | Source: Ambulatory Visit | Attending: Nephrology | Admitting: Nephrology

## 2020-10-01 DIAGNOSIS — I129 Hypertensive chronic kidney disease with stage 1 through stage 4 chronic kidney disease, or unspecified chronic kidney disease: Secondary | ICD-10-CM | POA: Diagnosis not present

## 2020-10-01 DIAGNOSIS — N170 Acute kidney failure with tubular necrosis: Secondary | ICD-10-CM | POA: Insufficient documentation

## 2020-10-01 DIAGNOSIS — R809 Proteinuria, unspecified: Secondary | ICD-10-CM | POA: Diagnosis not present

## 2020-10-01 DIAGNOSIS — N184 Chronic kidney disease, stage 4 (severe): Secondary | ICD-10-CM | POA: Insufficient documentation

## 2020-10-01 DIAGNOSIS — N200 Calculus of kidney: Secondary | ICD-10-CM | POA: Insufficient documentation

## 2020-10-01 LAB — CBC
HCT: 38.1 % (ref 36.0–46.0)
Hemoglobin: 12.5 g/dL (ref 12.0–15.0)
MCH: 30.8 pg (ref 26.0–34.0)
MCHC: 32.8 g/dL (ref 30.0–36.0)
MCV: 93.8 fL (ref 80.0–100.0)
Platelets: 155 10*3/uL (ref 150–400)
RBC: 4.06 MIL/uL (ref 3.87–5.11)
RDW: 14 % (ref 11.5–15.5)
WBC: 8.5 10*3/uL (ref 4.0–10.5)
nRBC: 0 % (ref 0.0–0.2)

## 2020-10-01 LAB — RENAL FUNCTION PANEL
Albumin: 3.7 g/dL (ref 3.5–5.0)
Anion gap: 6 (ref 5–15)
BUN: 27 mg/dL — ABNORMAL HIGH (ref 8–23)
CO2: 23 mmol/L (ref 22–32)
Calcium: 8.9 mg/dL (ref 8.9–10.3)
Chloride: 111 mmol/L (ref 98–111)
Creatinine, Ser: 2.2 mg/dL — ABNORMAL HIGH (ref 0.44–1.00)
GFR, Estimated: 21 mL/min — ABNORMAL LOW (ref >60)
Glucose, Bld: 133 mg/dL — ABNORMAL HIGH (ref 70–99)
Phosphorus: 3.3 mg/dL (ref 2.5–4.6)
Potassium: 3.5 mmol/L (ref 3.5–5.1)
Sodium: 140 mmol/L (ref 135–145)

## 2020-10-01 LAB — PROTEIN / CREATININE RATIO, URINE
Creatinine, Urine: 186.49 mg/dL
Protein Creatinine Ratio: 0.51 mg/mg{Cre} — ABNORMAL HIGH (ref 0.00–0.15)
Total Protein, Urine: 95 mg/dL

## 2020-10-02 LAB — PTH, INTACT AND CALCIUM
Calcium, Total (PTH): 9 mg/dL (ref 8.7–10.3)
PTH: 22 pg/mL (ref 15–65)

## 2020-10-08 ENCOUNTER — Ambulatory Visit (INDEPENDENT_AMBULATORY_CARE_PROVIDER_SITE_OTHER): Payer: PPO

## 2020-10-08 DIAGNOSIS — I495 Sick sinus syndrome: Secondary | ICD-10-CM

## 2020-10-09 LAB — CUP PACEART REMOTE DEVICE CHECK
Battery Remaining Longevity: 52 mo
Battery Remaining Percentage: 49 %
Battery Voltage: 2.98 V
Brady Statistic AP VP Percent: 14 %
Brady Statistic AP VS Percent: 68 %
Brady Statistic AS VP Percent: 1 %
Brady Statistic AS VS Percent: 18 %
Brady Statistic RA Percent Paced: 81 %
Brady Statistic RV Percent Paced: 14 %
Date Time Interrogation Session: 20220928032539
Implantable Lead Implant Date: 20161013
Implantable Lead Implant Date: 20161013
Implantable Lead Location: 753859
Implantable Lead Location: 753860
Implantable Pulse Generator Implant Date: 20161013
Lead Channel Impedance Value: 360 Ohm
Lead Channel Impedance Value: 410 Ohm
Lead Channel Pacing Threshold Amplitude: 0.75 V
Lead Channel Pacing Threshold Amplitude: 0.75 V
Lead Channel Pacing Threshold Pulse Width: 0.5 ms
Lead Channel Pacing Threshold Pulse Width: 0.5 ms
Lead Channel Sensing Intrinsic Amplitude: 1.3 mV
Lead Channel Sensing Intrinsic Amplitude: 12 mV
Lead Channel Setting Pacing Amplitude: 2 V
Lead Channel Setting Pacing Amplitude: 2.5 V
Lead Channel Setting Pacing Pulse Width: 0.5 ms
Lead Channel Setting Sensing Sensitivity: 2 mV
Pulse Gen Model: 2240
Pulse Gen Serial Number: 7820884

## 2020-10-10 DIAGNOSIS — I10 Essential (primary) hypertension: Secondary | ICD-10-CM | POA: Diagnosis not present

## 2020-10-10 DIAGNOSIS — M1990 Unspecified osteoarthritis, unspecified site: Secondary | ICD-10-CM | POA: Diagnosis not present

## 2020-10-10 DIAGNOSIS — E785 Hyperlipidemia, unspecified: Secondary | ICD-10-CM | POA: Diagnosis not present

## 2020-10-15 ENCOUNTER — Other Ambulatory Visit (HOSPITAL_COMMUNITY)
Admission: RE | Admit: 2020-10-15 | Discharge: 2020-10-15 | Disposition: A | Discharge status: Home / Self Care | Payer: PPO | Source: Ambulatory Visit | Attending: Nephrology | Admitting: Nephrology

## 2020-10-15 ENCOUNTER — Other Ambulatory Visit: Payer: Self-pay

## 2020-10-15 ENCOUNTER — Ambulatory Visit: Payer: PPO | Admitting: Urology

## 2020-10-15 ENCOUNTER — Encounter: Payer: Self-pay | Admitting: Urology

## 2020-10-15 VITALS — BP 165/80 | HR 66 | Temp 98.5°F | Wt 141.0 lb

## 2020-10-15 DIAGNOSIS — N184 Chronic kidney disease, stage 4 (severe): Secondary | ICD-10-CM | POA: Diagnosis not present

## 2020-10-15 DIAGNOSIS — R809 Proteinuria, unspecified: Secondary | ICD-10-CM | POA: Insufficient documentation

## 2020-10-15 DIAGNOSIS — N200 Calculus of kidney: Secondary | ICD-10-CM | POA: Diagnosis not present

## 2020-10-15 DIAGNOSIS — I129 Hypertensive chronic kidney disease with stage 1 through stage 4 chronic kidney disease, or unspecified chronic kidney disease: Secondary | ICD-10-CM | POA: Diagnosis not present

## 2020-10-15 DIAGNOSIS — N3941 Urge incontinence: Secondary | ICD-10-CM | POA: Diagnosis not present

## 2020-10-15 LAB — RENAL FUNCTION PANEL
Albumin: 4 g/dL (ref 3.5–5.0)
Anion gap: 7 (ref 5–15)
BUN: 25 mg/dL — ABNORMAL HIGH (ref 8–23)
CO2: 25 mmol/L (ref 22–32)
Calcium: 9.3 mg/dL (ref 8.9–10.3)
Chloride: 110 mmol/L (ref 98–111)
Creatinine, Ser: 2.09 mg/dL — ABNORMAL HIGH (ref 0.44–1.00)
GFR, Estimated: 23 mL/min — ABNORMAL LOW (ref >60)
Glucose, Bld: 90 mg/dL (ref 70–99)
Phosphorus: 3.6 mg/dL (ref 2.5–4.6)
Potassium: 4 mmol/L (ref 3.5–5.1)
Sodium: 142 mmol/L (ref 135–145)

## 2020-10-15 LAB — MICROSCOPIC EXAMINATION: Renal Epithel, UA: NONE SEEN /hpf

## 2020-10-15 LAB — URINALYSIS, ROUTINE W REFLEX MICROSCOPIC
Bilirubin, UA: NEGATIVE
Glucose, UA: NEGATIVE
Ketones, UA: NEGATIVE
Leukocytes,UA: NEGATIVE
Nitrite, UA: NEGATIVE
Specific Gravity, UA: 1.015 (ref 1.005–1.030)
Urobilinogen, Ur: 0.2 mg/dL (ref 0.2–1.0)
pH, UA: 5.5 (ref 5.0–7.5)

## 2020-10-15 MED ORDER — GEMTESA 75 MG PO TABS: 1.0000 | ORAL_TABLET | Freq: Every day | ORAL | 0 refills | Status: DC

## 2020-10-15 NOTE — Progress Notes (Signed)
Urological Symptom Review  Patient is experiencing the following symptoms: Frequent urination Leakage of urine Weak stream   Review of Systems  Gastrointestinal (upper)  : Nausea  Gastrointestinal (lower) : Diarrhea  Constitutional : Fatigue  Skin: Negative for skin symptoms  Eyes: Negative for eye symptoms  Ear/Nose/Throat : Negative for Ear/Nose/Throat symptoms  Hematologic/Lymphatic: Easy bruising  Cardiovascular : Negative for cardiovascular symptoms  Respiratory : Negative for respiratory symptoms  Endocrine: Negative for endocrine symptoms  Musculoskeletal: Negative for musculoskeletal symptoms  Neurological: Negative for neurological symptoms  Psychologic: Negative for psychiatric symptoms

## 2020-10-15 NOTE — Patient Instructions (Signed)

## 2020-10-15 NOTE — Progress Notes (Signed)
Remote pacemaker transmission.   
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
## 2020-11-10 DIAGNOSIS — M1990 Unspecified osteoarthritis, unspecified site: Secondary | ICD-10-CM | POA: Diagnosis not present

## 2020-11-10 DIAGNOSIS — I10 Essential (primary) hypertension: Secondary | ICD-10-CM | POA: Diagnosis not present

## 2020-11-17 DIAGNOSIS — N200 Calculus of kidney: Secondary | ICD-10-CM | POA: Diagnosis not present

## 2020-11-17 DIAGNOSIS — N184 Chronic kidney disease, stage 4 (severe): Secondary | ICD-10-CM | POA: Diagnosis not present

## 2020-11-17 DIAGNOSIS — I129 Hypertensive chronic kidney disease with stage 1 through stage 4 chronic kidney disease, or unspecified chronic kidney disease: Secondary | ICD-10-CM | POA: Diagnosis not present

## 2020-11-17 DIAGNOSIS — R809 Proteinuria, unspecified: Secondary | ICD-10-CM | POA: Diagnosis not present

## 2020-11-24 DIAGNOSIS — Z23 Encounter for immunization: Secondary | ICD-10-CM | POA: Diagnosis not present

## 2020-12-08 ENCOUNTER — Ambulatory Visit: Payer: PPO | Admitting: Urology

## 2020-12-08 DIAGNOSIS — N393 Stress incontinence (female) (male): Secondary | ICD-10-CM

## 2020-12-09 DIAGNOSIS — I129 Hypertensive chronic kidney disease with stage 1 through stage 4 chronic kidney disease, or unspecified chronic kidney disease: Secondary | ICD-10-CM | POA: Diagnosis not present

## 2020-12-09 DIAGNOSIS — N184 Chronic kidney disease, stage 4 (severe): Secondary | ICD-10-CM | POA: Diagnosis not present

## 2020-12-09 DIAGNOSIS — N200 Calculus of kidney: Secondary | ICD-10-CM | POA: Diagnosis not present

## 2020-12-09 DIAGNOSIS — R809 Proteinuria, unspecified: Secondary | ICD-10-CM | POA: Diagnosis not present

## 2020-12-09 DIAGNOSIS — E211 Secondary hyperparathyroidism, not elsewhere classified: Secondary | ICD-10-CM | POA: Diagnosis not present

## 2020-12-10 DIAGNOSIS — M1990 Unspecified osteoarthritis, unspecified site: Secondary | ICD-10-CM | POA: Diagnosis not present

## 2020-12-10 DIAGNOSIS — I10 Essential (primary) hypertension: Secondary | ICD-10-CM | POA: Diagnosis not present

## 2020-12-10 DIAGNOSIS — E782 Mixed hyperlipidemia: Secondary | ICD-10-CM | POA: Diagnosis not present

## 2020-12-22 ENCOUNTER — Telehealth: Payer: Self-pay

## 2020-12-22 ENCOUNTER — Other Ambulatory Visit: Payer: Self-pay

## 2020-12-22 DIAGNOSIS — N393 Stress incontinence (female) (male): Secondary | ICD-10-CM

## 2020-12-22 MED ORDER — GEMTESA 75 MG PO TABS: 1.0000 | ORAL_TABLET | Freq: Every day | ORAL | 5 refills | Status: DC

## 2020-12-22 NOTE — Telephone Encounter (Signed)
Refill sent as requested. 

## 2020-12-22 NOTE — Telephone Encounter (Signed)
Patients daughter called and advised patient needed medication called in. Patient is currently out of town for [place] ans needs it called in to the [facility] there.    Medication: Vibegron ([place]) 75 MG TABS   Pharmacy: [facility]   [address], [place], [place] [address] Phone: [phone]

## 2020-12-26 DIAGNOSIS — J40 Bronchitis, not specified as acute or chronic: Secondary | ICD-10-CM | POA: Diagnosis not present

## 2021-01-07 ENCOUNTER — Ambulatory Visit (INDEPENDENT_AMBULATORY_CARE_PROVIDER_SITE_OTHER): Payer: PPO

## 2021-01-07 DIAGNOSIS — I495 Sick sinus syndrome: Secondary | ICD-10-CM | POA: Diagnosis not present

## 2021-01-07 LAB — CUP PACEART REMOTE DEVICE CHECK
Battery Remaining Longevity: 49 mo
Battery Remaining Percentage: 47 %
Battery Voltage: 2.98 V
Brady Statistic AP VP Percent: 13 %
Brady Statistic AP VS Percent: 67 %
Brady Statistic AS VP Percent: 1 %
Brady Statistic AS VS Percent: 20 %
Brady Statistic RA Percent Paced: 78 %
Brady Statistic RV Percent Paced: 13 %
Date Time Interrogation Session: 20221228020021
Implantable Lead Implant Date: 20161013
Implantable Lead Implant Date: 20161013
Implantable Lead Location: 753859
Implantable Lead Location: 753860
Implantable Pulse Generator Implant Date: 20161013
Lead Channel Impedance Value: 360 Ohm
Lead Channel Impedance Value: 410 Ohm
Lead Channel Pacing Threshold Amplitude: 0.75 V
Lead Channel Pacing Threshold Amplitude: 0.75 V
Lead Channel Pacing Threshold Pulse Width: 0.5 ms
Lead Channel Pacing Threshold Pulse Width: 0.5 ms
Lead Channel Sensing Intrinsic Amplitude: 1.7 mV
Lead Channel Sensing Intrinsic Amplitude: 12 mV
Lead Channel Setting Pacing Amplitude: 2 V
Lead Channel Setting Pacing Amplitude: 2.5 V
Lead Channel Setting Pacing Pulse Width: 0.5 ms
Lead Channel Setting Sensing Sensitivity: 2 mV
Pulse Gen Model: 2240
Pulse Gen Serial Number: 7820884

## 2021-01-09 DIAGNOSIS — I10 Essential (primary) hypertension: Secondary | ICD-10-CM | POA: Diagnosis not present

## 2021-01-09 DIAGNOSIS — M1990 Unspecified osteoarthritis, unspecified site: Secondary | ICD-10-CM | POA: Diagnosis not present

## 2021-01-19 NOTE — Progress Notes (Signed)
Remote pacemaker transmission.   

## 2021-01-22 DIAGNOSIS — I129 Hypertensive chronic kidney disease with stage 1 through stage 4 chronic kidney disease, or unspecified chronic kidney disease: Secondary | ICD-10-CM | POA: Diagnosis not present

## 2021-01-22 DIAGNOSIS — I10 Essential (primary) hypertension: Secondary | ICD-10-CM | POA: Diagnosis not present

## 2021-01-22 DIAGNOSIS — M1990 Unspecified osteoarthritis, unspecified site: Secondary | ICD-10-CM | POA: Diagnosis not present

## 2021-01-29 ENCOUNTER — Other Ambulatory Visit: Payer: Self-pay | Admitting: Urology

## 2021-01-29 DIAGNOSIS — N3281 Overactive bladder: Secondary | ICD-10-CM | POA: Diagnosis not present

## 2021-01-29 DIAGNOSIS — I350 Nonrheumatic aortic (valve) stenosis: Secondary | ICD-10-CM | POA: Diagnosis not present

## 2021-01-29 DIAGNOSIS — N393 Stress incontinence (female) (male): Secondary | ICD-10-CM

## 2021-01-29 DIAGNOSIS — E782 Mixed hyperlipidemia: Secondary | ICD-10-CM | POA: Diagnosis not present

## 2021-01-29 DIAGNOSIS — I498 Other specified cardiac arrhythmias: Secondary | ICD-10-CM | POA: Diagnosis not present

## 2021-01-29 DIAGNOSIS — R809 Proteinuria, unspecified: Secondary | ICD-10-CM | POA: Diagnosis not present

## 2021-01-29 DIAGNOSIS — N184 Chronic kidney disease, stage 4 (severe): Secondary | ICD-10-CM | POA: Diagnosis not present

## 2021-01-29 DIAGNOSIS — Z0001 Encounter for general adult medical examination with abnormal findings: Secondary | ICD-10-CM | POA: Diagnosis not present

## 2021-01-29 DIAGNOSIS — M1990 Unspecified osteoarthritis, unspecified site: Secondary | ICD-10-CM | POA: Diagnosis not present

## 2021-01-29 DIAGNOSIS — F17200 Nicotine dependence, unspecified, uncomplicated: Secondary | ICD-10-CM | POA: Diagnosis not present

## 2021-01-29 DIAGNOSIS — N3941 Urge incontinence: Secondary | ICD-10-CM | POA: Diagnosis not present

## 2021-01-29 DIAGNOSIS — F0150 Vascular dementia without behavioral disturbance: Secondary | ICD-10-CM | POA: Diagnosis not present

## 2021-01-29 DIAGNOSIS — I129 Hypertensive chronic kidney disease with stage 1 through stage 4 chronic kidney disease, or unspecified chronic kidney disease: Secondary | ICD-10-CM | POA: Diagnosis not present

## 2021-02-10 DIAGNOSIS — E785 Hyperlipidemia, unspecified: Secondary | ICD-10-CM | POA: Diagnosis not present

## 2021-02-10 DIAGNOSIS — E211 Secondary hyperparathyroidism, not elsewhere classified: Secondary | ICD-10-CM | POA: Diagnosis not present

## 2021-02-10 DIAGNOSIS — N200 Calculus of kidney: Secondary | ICD-10-CM | POA: Diagnosis not present

## 2021-02-10 DIAGNOSIS — R809 Proteinuria, unspecified: Secondary | ICD-10-CM | POA: Diagnosis not present

## 2021-02-10 DIAGNOSIS — I129 Hypertensive chronic kidney disease with stage 1 through stage 4 chronic kidney disease, or unspecified chronic kidney disease: Secondary | ICD-10-CM | POA: Diagnosis not present

## 2021-02-10 DIAGNOSIS — I10 Essential (primary) hypertension: Secondary | ICD-10-CM | POA: Diagnosis not present

## 2021-02-10 DIAGNOSIS — N184 Chronic kidney disease, stage 4 (severe): Secondary | ICD-10-CM | POA: Diagnosis not present

## 2021-02-10 DIAGNOSIS — M1990 Unspecified osteoarthritis, unspecified site: Secondary | ICD-10-CM | POA: Diagnosis not present

## 2021-02-12 ENCOUNTER — Other Ambulatory Visit: Payer: Self-pay | Admitting: Nephrology

## 2021-02-12 ENCOUNTER — Other Ambulatory Visit (HOSPITAL_COMMUNITY): Payer: Self-pay | Admitting: Nephrology

## 2021-02-12 DIAGNOSIS — N170 Acute kidney failure with tubular necrosis: Secondary | ICD-10-CM | POA: Diagnosis not present

## 2021-02-12 DIAGNOSIS — R1031 Right lower quadrant pain: Secondary | ICD-10-CM

## 2021-02-12 DIAGNOSIS — R809 Proteinuria, unspecified: Secondary | ICD-10-CM

## 2021-02-12 DIAGNOSIS — E211 Secondary hyperparathyroidism, not elsewhere classified: Secondary | ICD-10-CM | POA: Diagnosis not present

## 2021-02-12 DIAGNOSIS — N184 Chronic kidney disease, stage 4 (severe): Secondary | ICD-10-CM | POA: Diagnosis not present

## 2021-02-12 DIAGNOSIS — N200 Calculus of kidney: Secondary | ICD-10-CM

## 2021-02-12 DIAGNOSIS — I129 Hypertensive chronic kidney disease with stage 1 through stage 4 chronic kidney disease, or unspecified chronic kidney disease: Secondary | ICD-10-CM | POA: Diagnosis not present

## 2021-03-03 DIAGNOSIS — R809 Proteinuria, unspecified: Secondary | ICD-10-CM | POA: Diagnosis not present

## 2021-03-03 DIAGNOSIS — N170 Acute kidney failure with tubular necrosis: Secondary | ICD-10-CM | POA: Diagnosis not present

## 2021-03-03 DIAGNOSIS — I129 Hypertensive chronic kidney disease with stage 1 through stage 4 chronic kidney disease, or unspecified chronic kidney disease: Secondary | ICD-10-CM | POA: Diagnosis not present

## 2021-03-03 DIAGNOSIS — N200 Calculus of kidney: Secondary | ICD-10-CM | POA: Diagnosis not present

## 2021-03-03 DIAGNOSIS — E211 Secondary hyperparathyroidism, not elsewhere classified: Secondary | ICD-10-CM | POA: Diagnosis not present

## 2021-03-03 DIAGNOSIS — N184 Chronic kidney disease, stage 4 (severe): Secondary | ICD-10-CM | POA: Diagnosis not present

## 2021-03-05 ENCOUNTER — Other Ambulatory Visit: Payer: Self-pay | Admitting: Urology

## 2021-03-05 DIAGNOSIS — N393 Stress incontinence (female) (male): Secondary | ICD-10-CM

## 2021-03-10 DIAGNOSIS — I10 Essential (primary) hypertension: Secondary | ICD-10-CM | POA: Diagnosis not present

## 2021-03-10 DIAGNOSIS — M1990 Unspecified osteoarthritis, unspecified site: Secondary | ICD-10-CM | POA: Diagnosis not present

## 2021-03-19 ENCOUNTER — Other Ambulatory Visit: Payer: Self-pay

## 2021-03-19 DIAGNOSIS — I495 Sick sinus syndrome: Secondary | ICD-10-CM

## 2021-03-26 DIAGNOSIS — E211 Secondary hyperparathyroidism, not elsewhere classified: Secondary | ICD-10-CM | POA: Diagnosis not present

## 2021-03-26 DIAGNOSIS — N184 Chronic kidney disease, stage 4 (severe): Secondary | ICD-10-CM | POA: Diagnosis not present

## 2021-03-26 DIAGNOSIS — I129 Hypertensive chronic kidney disease with stage 1 through stage 4 chronic kidney disease, or unspecified chronic kidney disease: Secondary | ICD-10-CM | POA: Diagnosis not present

## 2021-03-26 DIAGNOSIS — R809 Proteinuria, unspecified: Secondary | ICD-10-CM | POA: Diagnosis not present

## 2021-03-26 DIAGNOSIS — N170 Acute kidney failure with tubular necrosis: Secondary | ICD-10-CM | POA: Diagnosis not present

## 2021-03-26 DIAGNOSIS — R110 Nausea: Secondary | ICD-10-CM | POA: Diagnosis not present

## 2021-03-31 ENCOUNTER — Ambulatory Visit (HOSPITAL_COMMUNITY)
Admission: RE | Admit: 2021-03-31 | Discharge: 2021-03-31 | Disposition: A | Discharge status: Home / Self Care | Payer: PPO | Source: Ambulatory Visit | Attending: Nephrology | Admitting: Nephrology

## 2021-03-31 ENCOUNTER — Other Ambulatory Visit: Payer: Self-pay

## 2021-03-31 DIAGNOSIS — R1031 Right lower quadrant pain: Secondary | ICD-10-CM | POA: Insufficient documentation

## 2021-03-31 DIAGNOSIS — R809 Proteinuria, unspecified: Secondary | ICD-10-CM | POA: Diagnosis not present

## 2021-03-31 DIAGNOSIS — K5730 Diverticulosis of large intestine without perforation or abscess without bleeding: Secondary | ICD-10-CM | POA: Diagnosis not present

## 2021-03-31 DIAGNOSIS — N170 Acute kidney failure with tubular necrosis: Secondary | ICD-10-CM | POA: Diagnosis not present

## 2021-03-31 DIAGNOSIS — N200 Calculus of kidney: Secondary | ICD-10-CM | POA: Insufficient documentation

## 2021-03-31 DIAGNOSIS — N184 Chronic kidney disease, stage 4 (severe): Secondary | ICD-10-CM | POA: Diagnosis not present

## 2021-03-31 DIAGNOSIS — I129 Hypertensive chronic kidney disease with stage 1 through stage 4 chronic kidney disease, or unspecified chronic kidney disease: Secondary | ICD-10-CM | POA: Insufficient documentation

## 2021-03-31 DIAGNOSIS — N281 Cyst of kidney, acquired: Secondary | ICD-10-CM | POA: Diagnosis not present

## 2021-04-03 ENCOUNTER — Other Ambulatory Visit: Payer: Self-pay | Admitting: Urology

## 2021-04-03 DIAGNOSIS — N393 Stress incontinence (female) (male): Secondary | ICD-10-CM

## 2021-04-08 ENCOUNTER — Ambulatory Visit (HOSPITAL_COMMUNITY): Payer: PPO

## 2021-04-10 DIAGNOSIS — I10 Essential (primary) hypertension: Secondary | ICD-10-CM | POA: Diagnosis not present

## 2021-04-10 DIAGNOSIS — M1990 Unspecified osteoarthritis, unspecified site: Secondary | ICD-10-CM | POA: Diagnosis not present

## 2021-04-13 ENCOUNTER — Ambulatory Visit (INDEPENDENT_AMBULATORY_CARE_PROVIDER_SITE_OTHER): Payer: PPO

## 2021-04-13 DIAGNOSIS — I495 Sick sinus syndrome: Secondary | ICD-10-CM

## 2021-04-14 LAB — CUP PACEART REMOTE DEVICE CHECK
Battery Remaining Longevity: 47 mo
Battery Remaining Percentage: 44 %
Battery Voltage: 2.98 V
Brady Statistic AP VP Percent: 11 %
Brady Statistic AP VS Percent: 65 %
Brady Statistic AS VP Percent: 1 %
Brady Statistic AS VS Percent: 23 %
Brady Statistic RA Percent Paced: 76 %
Brady Statistic RV Percent Paced: 12 %
Date Time Interrogation Session: 20230403180946
Implantable Lead Implant Date: 20161013
Implantable Lead Implant Date: 20161013
Implantable Lead Location: 753859
Implantable Lead Location: 753860
Implantable Pulse Generator Implant Date: 20161013
Lead Channel Impedance Value: 350 Ohm
Lead Channel Impedance Value: 390 Ohm
Lead Channel Pacing Threshold Amplitude: 0.75 V
Lead Channel Pacing Threshold Amplitude: 0.75 V
Lead Channel Pacing Threshold Pulse Width: 0.5 ms
Lead Channel Pacing Threshold Pulse Width: 0.5 ms
Lead Channel Sensing Intrinsic Amplitude: 1.5 mV
Lead Channel Sensing Intrinsic Amplitude: 12 mV
Lead Channel Setting Pacing Amplitude: 2 V
Lead Channel Setting Pacing Amplitude: 2.5 V
Lead Channel Setting Pacing Pulse Width: 0.5 ms
Lead Channel Setting Sensing Sensitivity: 2 mV
Pulse Gen Model: 2240
Pulse Gen Serial Number: 7820884

## 2021-04-20 DIAGNOSIS — R5383 Other fatigue: Secondary | ICD-10-CM | POA: Diagnosis not present

## 2021-04-20 DIAGNOSIS — R58 Hemorrhage, not elsewhere classified: Secondary | ICD-10-CM | POA: Diagnosis not present

## 2021-04-23 ENCOUNTER — Ambulatory Visit (HOSPITAL_COMMUNITY)
Admission: RE | Admit: 2021-04-23 | Discharge: 2021-04-23 | Disposition: A | Discharge status: Home / Self Care | Payer: PPO | Source: Ambulatory Visit | Attending: Internal Medicine | Admitting: Internal Medicine

## 2021-04-23 DIAGNOSIS — I495 Sick sinus syndrome: Secondary | ICD-10-CM | POA: Diagnosis not present

## 2021-04-23 LAB — ECHOCARDIOGRAM COMPLETE
AR max vel: 1.22 cm2
AV Area VTI: 1.25 cm2
AV Area mean vel: 1.29 cm2
AV Mean grad: 43 mmHg
AV Peak grad: 71.4 mmHg
Ao pk vel: 4.23 m/s
Area-P 1/2: 2.32 cm2
S' Lateral: 2.6 cm

## 2021-04-23 NOTE — Progress Notes (Signed)
Remote pacemaker transmission.   

## 2021-04-23 NOTE — Progress Notes (Signed)
*  PRELIMINARY RESULTS* ?Echocardiogram ?2D Echocardiogram has been performed. ? ?Ashley Olsen ?04/23/2021, 11:01 AM ?

## 2021-04-29 ENCOUNTER — Telehealth: Payer: Self-pay | Admitting: *Deleted

## 2021-04-29 DIAGNOSIS — I700 Atherosclerosis of aorta: Secondary | ICD-10-CM

## 2021-04-29 NOTE — Telephone Encounter (Signed)
Order placed

## 2021-04-29 NOTE — Telephone Encounter (Signed)
-----   Message from Evans Lance, MD sent at 04/27/2021  5:15 PM EDT ----- ?Please refer her to Dr's. Cooper,[place], [place] for eval. GT ?
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
## 2021-05-04 ENCOUNTER — Encounter: Payer: Self-pay | Admitting: Cardiovascular Disease

## 2021-05-04 ENCOUNTER — Ambulatory Visit (INDEPENDENT_AMBULATORY_CARE_PROVIDER_SITE_OTHER): Payer: PPO | Admitting: Cardiovascular Disease

## 2021-05-04 VITALS — BP 126/70 | HR 63 | Ht 63.0 in | Wt 140.0 lb

## 2021-05-04 DIAGNOSIS — I350 Nonrheumatic aortic (valve) stenosis: Secondary | ICD-10-CM

## 2021-05-04 NOTE — Patient Instructions (Signed)
Medication Instructions:  ?No changes ? ? ?Lab Work: ?None--we will repeat your labs prior to heart catheterization ? ? ?Testing/Procedures: ?None ordered today ? ? ?Follow-Up: ?Per Structural Heart Valve Team ? ? ? ?

## 2021-05-04 NOTE — Progress Notes (Signed)
Pre Surgical Assessment: 5 M Walk Test ? ?67M=16.55f ? ?5 Meter Walk Test- trial 1: 6.32 seconds ?5 Meter Walk Test- trial 2: 5.56 seconds ?5 Meter Walk Test- trial 3: 5.14 seconds ?5 Meter Walk Test Average: 5.67 seconds ? ? ?

## 2021-05-10 DIAGNOSIS — J449 Chronic obstructive pulmonary disease, unspecified: Secondary | ICD-10-CM | POA: Diagnosis not present

## 2021-05-10 DIAGNOSIS — E782 Mixed hyperlipidemia: Secondary | ICD-10-CM | POA: Diagnosis not present

## 2021-05-10 DIAGNOSIS — I129 Hypertensive chronic kidney disease with stage 1 through stage 4 chronic kidney disease, or unspecified chronic kidney disease: Secondary | ICD-10-CM | POA: Diagnosis not present

## 2021-05-10 DIAGNOSIS — N184 Chronic kidney disease, stage 4 (severe): Secondary | ICD-10-CM | POA: Diagnosis not present

## 2021-05-19 DIAGNOSIS — E211 Secondary hyperparathyroidism, not elsewhere classified: Secondary | ICD-10-CM | POA: Diagnosis not present

## 2021-05-19 DIAGNOSIS — R809 Proteinuria, unspecified: Secondary | ICD-10-CM | POA: Diagnosis not present

## 2021-05-19 DIAGNOSIS — N184 Chronic kidney disease, stage 4 (severe): Secondary | ICD-10-CM | POA: Diagnosis not present

## 2021-05-19 DIAGNOSIS — R110 Nausea: Secondary | ICD-10-CM | POA: Diagnosis not present

## 2021-05-19 DIAGNOSIS — I129 Hypertensive chronic kidney disease with stage 1 through stage 4 chronic kidney disease, or unspecified chronic kidney disease: Secondary | ICD-10-CM | POA: Diagnosis not present

## 2021-05-19 DIAGNOSIS — N170 Acute kidney failure with tubular necrosis: Secondary | ICD-10-CM | POA: Diagnosis not present

## 2021-05-20 ENCOUNTER — Ambulatory Visit: Payer: PPO | Admitting: Internal Medicine

## 2021-05-20 ENCOUNTER — Encounter: Payer: Self-pay | Admitting: Internal Medicine

## 2021-05-20 DIAGNOSIS — I495 Sick sinus syndrome: Secondary | ICD-10-CM | POA: Diagnosis not present

## 2021-05-20 LAB — CUP PACEART INCLINIC DEVICE CHECK
Battery Remaining Longevity: 50 mo
Battery Voltage: 2.98 V
Brady Statistic RA Percent Paced: 76 %
Brady Statistic RV Percent Paced: 12 %
Date Time Interrogation Session: 20230510094649
Implantable Lead Implant Date: 20161013
Implantable Lead Implant Date: 20161013
Implantable Lead Location: 753859
Implantable Lead Location: 753860
Implantable Pulse Generator Implant Date: 20161013
Lead Channel Impedance Value: 362.5 Ohm
Lead Channel Impedance Value: 412.5 Ohm
Lead Channel Pacing Threshold Amplitude: 0.5 V
Lead Channel Pacing Threshold Amplitude: 0.5 V
Lead Channel Pacing Threshold Amplitude: 0.75 V
Lead Channel Pacing Threshold Amplitude: 0.75 V
Lead Channel Pacing Threshold Pulse Width: 0.5 ms
Lead Channel Pacing Threshold Pulse Width: 0.5 ms
Lead Channel Pacing Threshold Pulse Width: 0.5 ms
Lead Channel Pacing Threshold Pulse Width: 0.5 ms
Lead Channel Sensing Intrinsic Amplitude: 1.2 mV
Lead Channel Sensing Intrinsic Amplitude: 12 mV
Lead Channel Setting Pacing Amplitude: 2 V
Lead Channel Setting Pacing Amplitude: 2.5 V
Lead Channel Setting Pacing Pulse Width: 0.5 ms
Lead Channel Setting Sensing Sensitivity: 2 mV
Pulse Gen Model: 2240
Pulse Gen Serial Number: 7820884
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
## 2021-05-20 NOTE — Patient Instructions (Signed)
Medication Instructions:  Your physician recommends that you continue on your current medications as directed. Please refer to the Current Medication list given to you today.  *If you need a refill on your cardiac medications before your next appointment, please call your pharmacy*   Lab Work: NONE   If you have labs (blood work) drawn today and your tests are completely normal, you will receive your results only by: MyChart Message (if you have MyChart) OR A paper copy in the mail If you have any lab test that is abnormal or we need to change your treatment, we will call you to review the results.   Testing/Procedures: NONE    Follow-Up: At [facility], you and your health needs are our priority.  As part of our continuing mission to provide you with exceptional heart care, we have created designated Provider Care Teams.  These Care Teams include your primary Cardiologist (physician) and Advanced Practice Providers (APPs -  Physician Assistants and Nurse Practitioners) who all work together to provide you with the care you need, when you need it.  We recommend signing up for the patient portal called "MyChart".  Sign up information is provided on this After Visit Summary.  MyChart is used to connect with patients for Virtual Visits (Telemedicine).  Patients are able to view lab/test results, encounter notes, upcoming appointments, etc.  Non-urgent messages can be sent to your provider as well.   To learn more about what you can do with MyChart, go to [email or web address].    Your next appointment:   1 year(s)  The format for your next appointment:   In Person  Provider:   Gregg Taylor, MD    Other Instructions Thank you for choosing [facility]!    Important Information About Sugar       

## 2021-05-26 ENCOUNTER — Telehealth: Payer: Self-pay | Admitting: Urology

## 2021-05-26 NOTE — Telephone Encounter (Signed)
Patient requesting a prescription for Gemtesa to be sent to [facility]. ? ?Patient states that she is willing to make an appt if she needs to be seen. ? ?Please advise. ?

## 2021-05-28 DIAGNOSIS — N184 Chronic kidney disease, stage 4 (severe): Secondary | ICD-10-CM | POA: Diagnosis not present

## 2021-05-28 DIAGNOSIS — I350 Nonrheumatic aortic (valve) stenosis: Secondary | ICD-10-CM | POA: Diagnosis not present

## 2021-05-28 DIAGNOSIS — I129 Hypertensive chronic kidney disease with stage 1 through stage 4 chronic kidney disease, or unspecified chronic kidney disease: Secondary | ICD-10-CM | POA: Diagnosis not present

## 2021-05-28 DIAGNOSIS — E211 Secondary hyperparathyroidism, not elsewhere classified: Secondary | ICD-10-CM | POA: Diagnosis not present

## 2021-05-28 DIAGNOSIS — R809 Proteinuria, unspecified: Secondary | ICD-10-CM | POA: Diagnosis not present

## 2021-05-28 DIAGNOSIS — N200 Calculus of kidney: Secondary | ICD-10-CM | POA: Diagnosis not present

## 2021-05-28 NOTE — Telephone Encounter (Signed)
Family came by office. Samples of Gemtesa requested. Samples provided

## 2021-06-01 ENCOUNTER — Telehealth: Payer: Self-pay

## 2021-06-01 NOTE — Telephone Encounter (Signed)
  [facility]   MULTIDISCIPLINARY HEART VALVE TEAM   I contacted the pt's daughter, [name], to discuss if the pt would like to proceed with evaluation of aortic stenosis.  Per [name] the pt has decided that she does not wish to proceed with TAVR evaluation. The pt has spoken to Dr Theador Hawthorne in regards to needing contrast with evaluation and her risk of needing dialysis in the future.  The pt is adamant that she does not want to go on dialysis and she does not want to risk facing this in the future if she undergoes TAVR evaluation and surgery.  The pt also has memory issues and she is fearful of having further progression if she has anesthesia.  I advised [name] that the pt should continue to follow-up with Dr Lovena Le and if the pt decides that she would like to discuss TAVR in the future then our team will be happy to assist. [name] agreed with plan and was appreciative of all the care that her mother has received.

## 2021-06-10 ENCOUNTER — Ambulatory Visit: Payer: PPO | Admitting: Urology

## 2021-06-29 ENCOUNTER — Telehealth: Payer: Self-pay

## 2021-06-29 ENCOUNTER — Ambulatory Visit: Payer: PPO | Admitting: Urology

## 2021-06-29 DIAGNOSIS — N393 Stress incontinence (female) (male): Secondary | ICD-10-CM

## 2021-06-29 NOTE — Telephone Encounter (Signed)
Patients pharmacy called advising they did not have medication on file for her and needed  prescription sent into pharmacy. They advised they planned to deliver it by 2:00 PM today.    Medication: GEMTESA 75 MG TABS   Pharmacy: [facility] - [place], [place] - [place] [address]     Thank you

## 2021-07-01 NOTE — Telephone Encounter (Signed)
Last apt 10/15/2020, no showed apt on 06/29/2021  next apt scheduled in July.  Pharmacy requesting Gemtesa refill.  Ok to refill until next apt?  Please advise.

## 2021-07-06 ENCOUNTER — Other Ambulatory Visit: Payer: Self-pay

## 2021-07-06 DIAGNOSIS — N393 Stress incontinence (female) (male): Secondary | ICD-10-CM

## 2021-07-06 MED ORDER — GEMTESA 75 MG PO TABS: 1.0000 | ORAL_TABLET | Freq: Every day | ORAL | 2 refills | Status: DC

## 2021-07-09 ENCOUNTER — Other Ambulatory Visit: Payer: Self-pay | Admitting: Urology

## 2021-07-09 DIAGNOSIS — N393 Stress incontinence (female) (male): Secondary | ICD-10-CM

## 2021-07-13 ENCOUNTER — Ambulatory Visit (INDEPENDENT_AMBULATORY_CARE_PROVIDER_SITE_OTHER): Payer: PPO

## 2021-07-13 DIAGNOSIS — I495 Sick sinus syndrome: Secondary | ICD-10-CM | POA: Diagnosis not present

## 2021-07-14 LAB — CUP PACEART REMOTE DEVICE CHECK
Battery Remaining Longevity: 43 mo
Battery Remaining Percentage: 42 %
Battery Voltage: 2.98 V
Brady Statistic AP VP Percent: 15 %
Brady Statistic AP VS Percent: 66 %
Brady Statistic AS VP Percent: 1 %
Brady Statistic AS VS Percent: 19 %
Brady Statistic RA Percent Paced: 79 %
Brady Statistic RV Percent Paced: 15 %
Date Time Interrogation Session: 20230703020021
Implantable Lead Implant Date: 20161013
Implantable Lead Implant Date: 20161013
Implantable Lead Location: 753859
Implantable Lead Location: 753860
Implantable Pulse Generator Implant Date: 20161013
Lead Channel Impedance Value: 350 Ohm
Lead Channel Impedance Value: 380 Ohm
Lead Channel Pacing Threshold Amplitude: 0.5 V
Lead Channel Pacing Threshold Amplitude: 0.75 V
Lead Channel Pacing Threshold Pulse Width: 0.5 ms
Lead Channel Pacing Threshold Pulse Width: 0.5 ms
Lead Channel Sensing Intrinsic Amplitude: 1.7 mV
Lead Channel Sensing Intrinsic Amplitude: 12 mV
Lead Channel Setting Pacing Amplitude: 2 V
Lead Channel Setting Pacing Amplitude: 2.5 V
Lead Channel Setting Pacing Pulse Width: 0.5 ms
Lead Channel Setting Sensing Sensitivity: 2 mV
Pulse Gen Model: 2240
Pulse Gen Serial Number: 7820884

## 2021-07-27 ENCOUNTER — Ambulatory Visit: Payer: PPO | Admitting: Urology

## 2021-07-28 DIAGNOSIS — E782 Mixed hyperlipidemia: Secondary | ICD-10-CM | POA: Diagnosis not present

## 2021-07-28 DIAGNOSIS — R7301 Impaired fasting glucose: Secondary | ICD-10-CM | POA: Diagnosis not present

## 2021-07-29 ENCOUNTER — Other Ambulatory Visit: Payer: Self-pay | Admitting: *Deleted

## 2021-07-29 DIAGNOSIS — I129 Hypertensive chronic kidney disease with stage 1 through stage 4 chronic kidney disease, or unspecified chronic kidney disease: Secondary | ICD-10-CM | POA: Diagnosis not present

## 2021-07-29 DIAGNOSIS — F0150 Vascular dementia without behavioral disturbance: Secondary | ICD-10-CM | POA: Diagnosis not present

## 2021-07-29 DIAGNOSIS — E782 Mixed hyperlipidemia: Secondary | ICD-10-CM | POA: Diagnosis not present

## 2021-07-29 DIAGNOSIS — R7301 Impaired fasting glucose: Secondary | ICD-10-CM | POA: Diagnosis not present

## 2021-07-29 DIAGNOSIS — N3941 Urge incontinence: Secondary | ICD-10-CM | POA: Diagnosis not present

## 2021-07-29 DIAGNOSIS — N184 Chronic kidney disease, stage 4 (severe): Secondary | ICD-10-CM | POA: Diagnosis not present

## 2021-07-29 DIAGNOSIS — M1990 Unspecified osteoarthritis, unspecified site: Secondary | ICD-10-CM | POA: Diagnosis not present

## 2021-07-29 DIAGNOSIS — N3281 Overactive bladder: Secondary | ICD-10-CM | POA: Diagnosis not present

## 2021-07-29 DIAGNOSIS — I350 Nonrheumatic aortic (valve) stenosis: Secondary | ICD-10-CM | POA: Diagnosis not present

## 2021-07-29 DIAGNOSIS — F17200 Nicotine dependence, unspecified, uncomplicated: Secondary | ICD-10-CM | POA: Diagnosis not present

## 2021-07-29 DIAGNOSIS — I498 Other specified cardiac arrhythmias: Secondary | ICD-10-CM | POA: Diagnosis not present

## 2021-07-29 DIAGNOSIS — R809 Proteinuria, unspecified: Secondary | ICD-10-CM | POA: Diagnosis not present

## 2021-07-29 NOTE — Patient Outreach (Signed)
[facility]) Care Management  07/29/2021  Ashley Olsen 1932-02-18 [id number]  Successful telephone outreach call to patient's daughter [name] who states she is POA for screening. HIPAA identifiers obtained. [facility] reviewed and discussed with [name]. [name]  verbally agrees for her mother to participate in the [facility] program and requested that outreach calls be made to her due to her mothers memory issues. Nurse explained that a Nurse CM will outreach to the patient within the next several weeks. Daughter  verbalized understanding.  Emelia Loron RN, BSN [place] [phone] [name]

## 2021-08-05 ENCOUNTER — Ambulatory Visit (INDEPENDENT_AMBULATORY_CARE_PROVIDER_SITE_OTHER): Payer: PPO | Admitting: Urology

## 2021-08-05 ENCOUNTER — Encounter: Payer: Self-pay | Admitting: Urology

## 2021-08-05 VITALS — BP 113/67 | HR 85

## 2021-08-05 DIAGNOSIS — N393 Stress incontinence (female) (male): Secondary | ICD-10-CM

## 2021-08-05 DIAGNOSIS — N3941 Urge incontinence: Secondary | ICD-10-CM

## 2021-08-05 LAB — URINALYSIS, ROUTINE W REFLEX MICROSCOPIC
Bilirubin, UA: NEGATIVE
Glucose, UA: NEGATIVE
Leukocytes,UA: NEGATIVE
Nitrite, UA: NEGATIVE
Specific Gravity, UA: 1.015 (ref 1.005–1.030)
Urobilinogen, Ur: 0.2 mg/dL (ref 0.2–1.0)
pH, UA: 5 (ref 5.0–7.5)

## 2021-08-05 MED ORDER — GEMTESA 75 MG PO TABS: 1.0000 | ORAL_TABLET | Freq: Every day | ORAL | 11 refills | Status: DC
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
## 2021-08-05 NOTE — Patient Instructions (Signed)

## 2021-08-06 NOTE — Progress Notes (Signed)
Remote pacemaker transmission.   

## 2021-08-10 DIAGNOSIS — E782 Mixed hyperlipidemia: Secondary | ICD-10-CM | POA: Diagnosis not present

## 2021-08-10 DIAGNOSIS — J449 Chronic obstructive pulmonary disease, unspecified: Secondary | ICD-10-CM | POA: Diagnosis not present

## 2021-08-10 DIAGNOSIS — I129 Hypertensive chronic kidney disease with stage 1 through stage 4 chronic kidney disease, or unspecified chronic kidney disease: Secondary | ICD-10-CM | POA: Diagnosis not present

## 2021-08-10 DIAGNOSIS — N184 Chronic kidney disease, stage 4 (severe): Secondary | ICD-10-CM | POA: Diagnosis not present

## 2021-08-11 DIAGNOSIS — N184 Chronic kidney disease, stage 4 (severe): Secondary | ICD-10-CM | POA: Diagnosis not present

## 2021-08-11 DIAGNOSIS — R809 Proteinuria, unspecified: Secondary | ICD-10-CM | POA: Diagnosis not present

## 2021-08-11 DIAGNOSIS — E211 Secondary hyperparathyroidism, not elsewhere classified: Secondary | ICD-10-CM | POA: Diagnosis not present

## 2021-08-11 DIAGNOSIS — N200 Calculus of kidney: Secondary | ICD-10-CM | POA: Diagnosis not present

## 2021-08-11 DIAGNOSIS — I129 Hypertensive chronic kidney disease with stage 1 through stage 4 chronic kidney disease, or unspecified chronic kidney disease: Secondary | ICD-10-CM | POA: Diagnosis not present

## 2021-08-12 DIAGNOSIS — N184 Chronic kidney disease, stage 4 (severe): Secondary | ICD-10-CM | POA: Diagnosis not present

## 2021-08-12 DIAGNOSIS — E211 Secondary hyperparathyroidism, not elsewhere classified: Secondary | ICD-10-CM | POA: Diagnosis not present

## 2021-08-12 DIAGNOSIS — R809 Proteinuria, unspecified: Secondary | ICD-10-CM | POA: Diagnosis not present

## 2021-08-12 DIAGNOSIS — I350 Nonrheumatic aortic (valve) stenosis: Secondary | ICD-10-CM | POA: Diagnosis not present

## 2021-08-12 DIAGNOSIS — N200 Calculus of kidney: Secondary | ICD-10-CM | POA: Diagnosis not present

## 2021-08-12 DIAGNOSIS — I129 Hypertensive chronic kidney disease with stage 1 through stage 4 chronic kidney disease, or unspecified chronic kidney disease: Secondary | ICD-10-CM | POA: Diagnosis not present

## 2021-09-10 DIAGNOSIS — J449 Chronic obstructive pulmonary disease, unspecified: Secondary | ICD-10-CM | POA: Diagnosis not present

## 2021-09-10 DIAGNOSIS — I129 Hypertensive chronic kidney disease with stage 1 through stage 4 chronic kidney disease, or unspecified chronic kidney disease: Secondary | ICD-10-CM | POA: Diagnosis not present

## 2021-09-10 DIAGNOSIS — E782 Mixed hyperlipidemia: Secondary | ICD-10-CM | POA: Diagnosis not present

## 2021-09-10 DIAGNOSIS — N184 Chronic kidney disease, stage 4 (severe): Secondary | ICD-10-CM | POA: Diagnosis not present

## 2021-09-18 ENCOUNTER — Telehealth: Payer: Self-pay | Admitting: Internal Medicine

## 2021-09-18 NOTE — Telephone Encounter (Signed)
Spoke with daughter who states that on yesterday pt c/o being SOB and weak. No other complaints at this time. Daughter states that this comes and goes. Pt's daughter is unable to weigh pt at this time. Please advise.

## 2021-09-18 NOTE — Telephone Encounter (Signed)
Pt c/o Shortness Of Breath: STAT if SOB developed within the last 24 hours or pt is noticeably SOB on the phone  1. Are you currently SOB (can you hear that pt is SOB on the phone)?  Daughter not currently with the patient  2. How long have you been experiencing SOB?  Became noticeable to daughter yesterday  3. Are you SOB when sitting or when up moving around?  When up and moving around  4. Are you currently experiencing any other symptoms?  Not currently with the patient, but daughter states the patient has been very weak

## 2021-09-23 DIAGNOSIS — D3131 Benign neoplasm of right choroid: Secondary | ICD-10-CM | POA: Diagnosis not present

## 2021-09-23 NOTE — Telephone Encounter (Signed)
Pt daughter Fannie Knee called regarding the message received at [facility] Triage on [facility]. ( See below...)  Ms. Huffines did not answer the telephone.    Pt called at home phone number and answered my call.  Pt called regarding concern received with shortness of breath and weakness.  ( Per Dr Tanna Furry note, Pt regular cigarette smoker, and gets winded with ambulation. )  Pt stated that sometimes she gets winded / short of breath, and sometimes she dosent.  Pt states sometimes she feels weak, and other times she does not.   Pt stated yesterday felt a little weak, and short of breath, but today feels better and denies any cardiac symptom I asked her about.   Pt appreciated my call, and I recommended she f/u with her PCP if this continues, go to the ER if her symptoms return or worsen.   No f/u required at this time.
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
## 2021-10-01 ENCOUNTER — Ambulatory Visit: Payer: PPO | Admitting: Physician Assistant

## 2021-10-10 DIAGNOSIS — N184 Chronic kidney disease, stage 4 (severe): Secondary | ICD-10-CM | POA: Diagnosis not present

## 2021-10-10 DIAGNOSIS — J449 Chronic obstructive pulmonary disease, unspecified: Secondary | ICD-10-CM | POA: Diagnosis not present

## 2021-10-10 DIAGNOSIS — E782 Mixed hyperlipidemia: Secondary | ICD-10-CM | POA: Diagnosis not present

## 2021-10-10 DIAGNOSIS — I129 Hypertensive chronic kidney disease with stage 1 through stage 4 chronic kidney disease, or unspecified chronic kidney disease: Secondary | ICD-10-CM | POA: Diagnosis not present

## 2021-10-12 LAB — CUP PACEART REMOTE DEVICE CHECK
Battery Remaining Longevity: 44 mo
Battery Remaining Percentage: 39 %
Battery Voltage: 2.98 V
Brady Statistic AP VP Percent: 16 %
Brady Statistic AP VS Percent: 57 %
Brady Statistic AS VP Percent: 1 %
Brady Statistic AS VS Percent: 26 %
Brady Statistic RA Percent Paced: 73 %
Brady Statistic RV Percent Paced: 16 %
Date Time Interrogation Session: 20231002021745
Implantable Lead Implant Date: 20161013
Implantable Lead Implant Date: 20161013
Implantable Lead Location: 753859
Implantable Lead Location: 753860
Implantable Pulse Generator Implant Date: 20161013
Lead Channel Impedance Value: 400 Ohm
Lead Channel Impedance Value: 540 Ohm
Lead Channel Pacing Threshold Amplitude: 0.5 V
Lead Channel Pacing Threshold Amplitude: 0.75 V
Lead Channel Pacing Threshold Pulse Width: 0.5 ms
Lead Channel Pacing Threshold Pulse Width: 0.5 ms
Lead Channel Sensing Intrinsic Amplitude: 1.5 mV
Lead Channel Sensing Intrinsic Amplitude: 12 mV
Lead Channel Setting Pacing Amplitude: 2 V
Lead Channel Setting Pacing Amplitude: 2.5 V
Lead Channel Setting Pacing Pulse Width: 0.5 ms
Lead Channel Setting Sensing Sensitivity: 2 mV
Pulse Gen Model: 2240
Pulse Gen Serial Number: 7820884

## 2021-10-20 DIAGNOSIS — N200 Calculus of kidney: Secondary | ICD-10-CM | POA: Diagnosis not present

## 2021-10-20 DIAGNOSIS — N184 Chronic kidney disease, stage 4 (severe): Secondary | ICD-10-CM | POA: Diagnosis not present

## 2021-10-20 DIAGNOSIS — I129 Hypertensive chronic kidney disease with stage 1 through stage 4 chronic kidney disease, or unspecified chronic kidney disease: Secondary | ICD-10-CM | POA: Diagnosis not present

## 2021-10-20 DIAGNOSIS — R809 Proteinuria, unspecified: Secondary | ICD-10-CM | POA: Diagnosis not present

## 2021-10-20 DIAGNOSIS — E211 Secondary hyperparathyroidism, not elsewhere classified: Secondary | ICD-10-CM | POA: Diagnosis not present

## 2021-10-20 DIAGNOSIS — I350 Nonrheumatic aortic (valve) stenosis: Secondary | ICD-10-CM | POA: Diagnosis not present

## 2021-10-21 DIAGNOSIS — E870 Hyperosmolality and hypernatremia: Secondary | ICD-10-CM | POA: Diagnosis not present

## 2021-10-21 DIAGNOSIS — I350 Nonrheumatic aortic (valve) stenosis: Secondary | ICD-10-CM | POA: Diagnosis not present

## 2021-10-21 DIAGNOSIS — I129 Hypertensive chronic kidney disease with stage 1 through stage 4 chronic kidney disease, or unspecified chronic kidney disease: Secondary | ICD-10-CM | POA: Diagnosis not present

## 2021-10-21 DIAGNOSIS — N200 Calculus of kidney: Secondary | ICD-10-CM | POA: Diagnosis not present

## 2021-10-21 DIAGNOSIS — N184 Chronic kidney disease, stage 4 (severe): Secondary | ICD-10-CM | POA: Diagnosis not present

## 2021-10-21 DIAGNOSIS — R809 Proteinuria, unspecified: Secondary | ICD-10-CM | POA: Diagnosis not present

## 2021-11-24 ENCOUNTER — Encounter: Payer: Self-pay | Admitting: Internal Medicine

## 2021-11-24 ENCOUNTER — Ambulatory Visit: Payer: PPO | Attending: Internal Medicine | Admitting: Internal Medicine

## 2021-11-24 VITALS — BP 128/82 | HR 72 | Ht 63.0 in | Wt 139.0 lb

## 2021-11-24 DIAGNOSIS — I495 Sick sinus syndrome: Secondary | ICD-10-CM

## 2021-11-24 LAB — CUP PACEART INCLINIC DEVICE CHECK
Battery Remaining Longevity: 44 mo
Battery Voltage: 2.98 V
Brady Statistic RA Percent Paced: 70 %
Brady Statistic RV Percent Paced: 15 %
Date Time Interrogation Session: 20231114151911
Implantable Lead Connection Status: 753985
Implantable Lead Connection Status: 753985
Implantable Lead Implant Date: 20161013
Implantable Lead Implant Date: 20161013
Implantable Lead Location: 753859
Implantable Lead Location: 753860
Implantable Pulse Generator Implant Date: 20161013
Lead Channel Impedance Value: 337.5 Ohm
Lead Channel Impedance Value: 375 Ohm
Lead Channel Pacing Threshold Amplitude: 0.75 V
Lead Channel Pacing Threshold Amplitude: 0.75 V
Lead Channel Pacing Threshold Amplitude: 1 V
Lead Channel Pacing Threshold Amplitude: 1 V
Lead Channel Pacing Threshold Pulse Width: 0.5 ms
Lead Channel Pacing Threshold Pulse Width: 0.5 ms
Lead Channel Pacing Threshold Pulse Width: 0.5 ms
Lead Channel Pacing Threshold Pulse Width: 0.5 ms
Lead Channel Sensing Intrinsic Amplitude: 1.3 mV
Lead Channel Sensing Intrinsic Amplitude: 12 mV
Lead Channel Setting Pacing Amplitude: 2 V
Lead Channel Setting Pacing Amplitude: 2.5 V
Lead Channel Setting Pacing Pulse Width: 0.5 ms
Lead Channel Setting Sensing Sensitivity: 2 mV
Pulse Gen Model: 2240
Pulse Gen Serial Number: 7820884

## 2021-11-24 NOTE — Patient Instructions (Signed)
Medication Instructions:  Your physician recommends that you continue on your current medications as directed. Please refer to the Current Medication list given to you today.  *If you need a refill on your cardiac medications before your next appointment, please call your pharmacy*   Lab Work: NONE   If you have labs (blood work) drawn today and your tests are completely normal, you will receive your results only by: MyChart Message (if you have MyChart) OR A paper copy in the mail If you have any lab test that is abnormal or we need to change your treatment, we will call you to review the results.   Testing/Procedures: NONE    Follow-Up: At [facility], you and your health needs are our priority.  As part of our continuing mission to provide you with exceptional heart care, we have created designated Provider Care Teams.  These Care Teams include your primary Cardiologist (physician) and Advanced Practice Providers (APPs -  Physician Assistants and Nurse Practitioners) who all work together to provide you with the care you need, when you need it.  We recommend signing up for the patient portal called "MyChart".  Sign up information is provided on this After Visit Summary.  MyChart is used to connect with patients for Virtual Visits (Telemedicine).  Patients are able to view lab/test results, encounter notes, upcoming appointments, etc.  Non-urgent messages can be sent to your provider as well.   To learn more about what you can do with MyChart, go to [email or web address].    Your next appointment:   1 year(s)  The format for your next appointment:   In Person  Provider:   Gregg Taylor, MD    Other Instructions Thank you for choosing [facility]!    Important Information About Sugar       
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
## 2021-11-30 DIAGNOSIS — Z23 Encounter for immunization: Secondary | ICD-10-CM | POA: Diagnosis not present

## 2022-01-11 ENCOUNTER — Emergency Department (HOSPITAL_COMMUNITY)
Admission: EM | Admit: 2022-01-11 | Discharge: 2022-01-12 | Disposition: A | Discharge status: Home / Self Care | Payer: PPO | Attending: Emergency Medicine | Admitting: Emergency Medicine

## 2022-01-11 ENCOUNTER — Encounter (HOSPITAL_COMMUNITY): Payer: Self-pay

## 2022-01-11 ENCOUNTER — Emergency Department (HOSPITAL_COMMUNITY): Payer: PPO

## 2022-01-11 ENCOUNTER — Other Ambulatory Visit: Payer: Self-pay

## 2022-01-11 DIAGNOSIS — F0390 Unspecified dementia without behavioral disturbance: Secondary | ICD-10-CM | POA: Insufficient documentation

## 2022-01-11 DIAGNOSIS — Z79899 Other long term (current) drug therapy: Secondary | ICD-10-CM | POA: Diagnosis not present

## 2022-01-11 DIAGNOSIS — R944 Abnormal results of kidney function studies: Secondary | ICD-10-CM | POA: Insufficient documentation

## 2022-01-11 DIAGNOSIS — K5730 Diverticulosis of large intestine without perforation or abscess without bleeding: Secondary | ICD-10-CM | POA: Diagnosis not present

## 2022-01-11 DIAGNOSIS — R140 Abdominal distension (gaseous): Secondary | ICD-10-CM | POA: Diagnosis not present

## 2022-01-11 DIAGNOSIS — K7689 Other specified diseases of liver: Secondary | ICD-10-CM | POA: Diagnosis not present

## 2022-01-11 DIAGNOSIS — E860 Dehydration: Secondary | ICD-10-CM | POA: Insufficient documentation

## 2022-01-11 DIAGNOSIS — E876 Hypokalemia: Secondary | ICD-10-CM | POA: Diagnosis not present

## 2022-01-11 DIAGNOSIS — N189 Chronic kidney disease, unspecified: Secondary | ICD-10-CM | POA: Insufficient documentation

## 2022-01-11 DIAGNOSIS — R531 Weakness: Secondary | ICD-10-CM | POA: Diagnosis not present

## 2022-01-11 DIAGNOSIS — I10 Essential (primary) hypertension: Secondary | ICD-10-CM | POA: Diagnosis not present

## 2022-01-11 LAB — CBC
HCT: 40.2 % (ref 36.0–46.0)
Hemoglobin: 13.2 g/dL (ref 12.0–15.0)
MCH: 31.1 pg (ref 26.0–34.0)
MCHC: 32.8 g/dL (ref 30.0–36.0)
MCV: 94.6 fL (ref 80.0–100.0)
Platelets: 175 10*3/uL (ref 150–400)
RBC: 4.25 MIL/uL (ref 3.87–5.11)
RDW: 13.2 % (ref 11.5–15.5)
WBC: 7.4 10*3/uL (ref 4.0–10.5)
nRBC: 0 % (ref 0.0–0.2)

## 2022-01-11 LAB — BASIC METABOLIC PANEL
Anion gap: 11 (ref 5–15)
BUN: 33 mg/dL — ABNORMAL HIGH (ref 8–23)
CO2: 24 mmol/L (ref 22–32)
Calcium: 9 mg/dL (ref 8.9–10.3)
Chloride: 105 mmol/L (ref 98–111)
Creatinine, Ser: 2.75 mg/dL — ABNORMAL HIGH (ref 0.44–1.00)
GFR, Estimated: 16 mL/min — ABNORMAL LOW (ref >60)
Glucose, Bld: 162 mg/dL — ABNORMAL HIGH (ref 70–99)
Potassium: 3 mmol/L — ABNORMAL LOW (ref 3.5–5.1)
Sodium: 140 mmol/L (ref 135–145)

## 2022-01-11 LAB — MAGNESIUM: Magnesium: 2.1 mg/dL (ref 1.7–2.4)

## 2022-01-11 LAB — CBG MONITORING, ED: Glucose-Capillary: 173 mg/dL — ABNORMAL HIGH (ref 70–99)

## 2022-01-11 MED ORDER — POTASSIUM CHLORIDE CRYS ER 20 MEQ PO TBCR
40.0000 meq | EXTENDED_RELEASE_TABLET | Freq: Once | ORAL | Status: AC
  Administered 2022-01-11: 40 meq via ORAL
  Filled 2022-01-11: qty 2

## 2022-01-11 MED ORDER — POTASSIUM CHLORIDE 10 MEQ/100ML IV SOLN
10.0000 meq | Freq: Once | INTRAVENOUS | Status: AC
  Administered 2022-01-11: 10 meq via INTRAVENOUS
  Filled 2022-01-11: qty 100

## 2022-01-11 MED ORDER — LACTATED RINGERS IV BOLUS
1000.0000 mL | Freq: Once | INTRAVENOUS | Status: AC
  Administered 2022-01-11: 1000 mL via INTRAVENOUS

## 2022-01-11 MED ORDER — MAGNESIUM SULFATE 2 GM/50ML IV SOLN
2.0000 g | Freq: Once | INTRAVENOUS | Status: AC
  Administered 2022-01-11: 2 g via INTRAVENOUS
  Filled 2022-01-11: qty 50

## 2022-01-11 NOTE — ED Provider Notes (Signed)
[facility] EMERGENCY DEPARTMENT Provider Note   CSN: [id number] Arrival date & time: 01/11/22  2047     History {Add pertinent medical, surgical, social history, OB history to HPI:1} Chief Complaint  Patient presents with   Weakness    Ashley Olsen is a 89 y.o. female.  89 year old female who presents ER today with generalized weakness.  She is with her daughter who helps supply the history as the patient has dementia.  Send the patient is been constipated for 5 or 6 days.  Her daughter was worried about it so she gave her some MiraLAX this morning.  She did not have any output throughout the day went out to eat and was still feeling like she had to the bathroom but could not go so daughter gave some prune juice.  Sounds like within an hour the patient had uncontrollable diarrhea and felt very weak and nauseous so EMS was called.  They brought her here for further evaluation.  Patient has known chronic kidney disease creatinine is usually around 2-2.1.  She also has known aortic stenosis/calcification.  Illnesses.  She has chronic cough from smoking.   Weakness      Home Medications Prior to Admission medications   Medication Sig Start Date End Date Taking? Authorizing Provider  acetaminophen (TYLENOL) 325 MG tablet Take 2 tablets (650 mg total) by mouth every 6 (six) hours as needed for mild pain, fever or headache (or Fever >/= 101). 06/22/19   Roxan Hockey, MD  amLODipine (NORVASC) 5 MG tablet Take 5 mg by mouth daily. 08/09/19   [provider]  cholecalciferol (VITAMIN D3) 25 MCG (1000 UNIT) tablet Take 1,000 Units by mouth daily.    [provider]  donepezil (ARICEPT) 10 MG tablet Take 10 mg by mouth daily. 11/09/19   [provider]  mirtazapine (REMERON) 15 MG tablet Take 1 tablet (15 mg total) by mouth at bedtime. 02/27/20   Lindell Spar, MD  simvastatin (ZOCOR) 20 MG tablet Take 1 tablet (20 mg total) by mouth daily with supper. 06/22/19    Roxan Hockey, MD  traMADol (ULTRAM) 50 MG tablet Take 50 mg by mouth every 12 (twelve) hours as needed for severe pain.    [provider]  Vibegron (GEMTESA) 75 MG TABS Take 1 tablet by mouth daily. 08/05/21   McKenzie, Candee Furbish, MD      Allergies    Codeine and Sulfonamide derivatives    Review of Systems   Review of Systems  Neurological:  Positive for weakness.    Physical Exam Updated Vital Signs BP 128/66   Pulse 65   Temp 98.6 F (37 C) (Oral)   Resp 18   Ht '5\' 3"'$  (1.6 m)   Wt 63 kg   SpO2 95%   BMI 24.60 kg/m  Physical Exam Vitals and nursing note reviewed.  Constitutional:      Appearance: She is well-developed.  HENT:     Head: Normocephalic and atraumatic.     Nose: No congestion or rhinorrhea.     Mouth/Throat:     Pharynx: No oropharyngeal exudate or posterior oropharyngeal erythema.  Eyes:     Pupils: Pupils are equal, round, and reactive to light.  Cardiovascular:     Rate and Rhythm: Normal rate and regular rhythm.  Pulmonary:     Effort: No respiratory distress.     Breath sounds: No stridor.  Abdominal:     General: There is distension (mildly distended but not tender).  Musculoskeletal:        General: No swelling or tenderness. Normal range of motion.     Cervical back: Normal range of motion.  Skin:    General: Skin is warm and dry.  Neurological:     General: No focal deficit present.     Mental Status: She is alert.     ED Results / Procedures / Treatments   Labs (all labs ordered are listed, but only abnormal results are displayed) Labs Reviewed  BASIC METABOLIC PANEL - Abnormal; Notable for the following components:      Result Value   Potassium 3.0 (*)    Glucose, Bld 162 (*)    BUN 33 (*)    Creatinine, Ser 2.75 (*)    GFR, Estimated 16 (*)    All other components within normal limits  CBG MONITORING, ED - Abnormal; Notable for the following components:   Glucose-Capillary 173 (*)    All other components  within normal limits  CBC  URINALYSIS, ROUTINE W REFLEX MICROSCOPIC  MAGNESIUM    EKG EKG Interpretation  Date/Time:  Monday January 11 2022 21:16:12 EST Ventricular Rate:  68 PR Interval:  276 QRS Duration: 101 QT Interval:  491 QTC Calculation: 523 R Axis:   33 Text Interpretation: Sinus rhythm Prolonged PR interval Borderline T abnormalities, lateral leads Prolonged QT interval Confirmed by Merrily Pew [phone]) on 01/11/2022 11:08:26 PM  Radiology No results found.  Procedures Procedures  {Document cardiac monitor, telemetry assessment procedure when appropriate:1}  Medications Ordered in ED Medications  lactated ringers bolus 1,000 mL (has no administration in time range)  magnesium sulfate IVPB 2 g 50 mL (2 g Intravenous New Bag/Given 01/11/22 2340)  potassium chloride 10 mEq in 100 mL IVPB (10 mEq Intravenous New Bag/Given 01/11/22 2340)  potassium chloride SA (KLOR-CON M) CR tablet 40 mEq (40 mEq Oral Given 01/11/22 2339)    ED Course/ Medical Decision Making/ A&P                           Medical Decision Making Amount and/or Complexity of Data Reviewed Labs: ordered. Radiology: ordered.  Risk Prescription drug management.  A 19-year-old female with generalized weakness.  Found to hypokalemic and mildly elevated above baseline creatinine.  Will give some fluids and potassium and scan her belly to ensure no anatomic causes for her constipation but is having good bowel movements now low suspicion for bowel obstruction. ***  {Document critical care time when appropriate:1} {Document review of labs and clinical decision tools ie heart score, Chads2Vasc2 etc:1}  {Document your independent review of radiology images, and any outside records:1} {Document your discussion with family members, caretakers, and with consultants:1} {Document social determinants of health affecting pt's care:1} {Document your decision making why or why not admission, treatments were  needed:1} Final Clinical Impression(s) / ED Diagnoses Final diagnoses:  None    Rx / DC Orders ED Discharge Orders     None

## 2022-01-11 NOTE — ED Notes (Signed)
Patient transported to CT 

## 2022-01-11 NOTE — ED Triage Notes (Signed)
Rcems from home. Cc of weakness for a few days. Per ems pt was initially constipated. Family gave miralax and prune juice. Had BM. And now family wants her checked out for weakness.  VSS with EMS

## 2022-01-12 ENCOUNTER — Ambulatory Visit (INDEPENDENT_AMBULATORY_CARE_PROVIDER_SITE_OTHER): Payer: PPO

## 2022-01-12 DIAGNOSIS — I495 Sick sinus syndrome: Secondary | ICD-10-CM | POA: Diagnosis not present

## 2022-01-12 DIAGNOSIS — K7689 Other specified diseases of liver: Secondary | ICD-10-CM | POA: Diagnosis not present

## 2022-01-12 DIAGNOSIS — K5730 Diverticulosis of large intestine without perforation or abscess without bleeding: Secondary | ICD-10-CM | POA: Diagnosis not present

## 2022-01-12 LAB — CUP PACEART REMOTE DEVICE CHECK
Battery Remaining Longevity: 39 mo
Battery Remaining Percentage: 37 %
Battery Voltage: 2.96 V
Brady Statistic AP VP Percent: 22 %
Brady Statistic AP VS Percent: 52 %
Brady Statistic AS VP Percent: 1 %
Brady Statistic AS VS Percent: 26 %
Brady Statistic RA Percent Paced: 73 %
Brady Statistic RV Percent Paced: 22 %
Date Time Interrogation Session: 20240101020158
Implantable Lead Connection Status: 753985
Implantable Lead Connection Status: 753985
Implantable Lead Implant Date: 20161013
Implantable Lead Implant Date: 20161013
Implantable Lead Location: 753859
Implantable Lead Location: 753860
Implantable Pulse Generator Implant Date: 20161013
Lead Channel Impedance Value: 360 Ohm
Lead Channel Impedance Value: 390 Ohm
Lead Channel Pacing Threshold Amplitude: 0.75 V
Lead Channel Pacing Threshold Amplitude: 1 V
Lead Channel Pacing Threshold Pulse Width: 0.5 ms
Lead Channel Pacing Threshold Pulse Width: 0.5 ms
Lead Channel Sensing Intrinsic Amplitude: 1.1 mV
Lead Channel Sensing Intrinsic Amplitude: 12 mV
Lead Channel Setting Pacing Amplitude: 2 V
Lead Channel Setting Pacing Amplitude: 2.5 V
Lead Channel Setting Pacing Pulse Width: 0.5 ms
Lead Channel Setting Sensing Sensitivity: 2 mV
Pulse Gen Model: 2240
Pulse Gen Serial Number: 7820884

## 2022-01-12 LAB — URINALYSIS, ROUTINE W REFLEX MICROSCOPIC
Bilirubin Urine: NEGATIVE
Glucose, UA: NEGATIVE mg/dL
Ketones, ur: NEGATIVE mg/dL
Leukocytes,Ua: NEGATIVE
Nitrite: NEGATIVE
Protein, ur: 100 mg/dL — AB
Specific Gravity, Urine: 1.013 (ref 1.005–1.030)
pH: 5 (ref 5.0–8.0)

## 2022-01-12 MED ORDER — POTASSIUM CHLORIDE CRYS ER 20 MEQ PO TBCR: 40.0000 meq | EXTENDED_RELEASE_TABLET | Freq: Two times a day (BID) | ORAL | 0 refills | Status: DC

## 2022-01-14 DIAGNOSIS — E782 Mixed hyperlipidemia: Secondary | ICD-10-CM | POA: Diagnosis not present

## 2022-01-14 DIAGNOSIS — R7301 Impaired fasting glucose: Secondary | ICD-10-CM | POA: Diagnosis not present

## 2022-01-26 DIAGNOSIS — I10 Essential (primary) hypertension: Secondary | ICD-10-CM | POA: Diagnosis not present

## 2022-01-26 DIAGNOSIS — I129 Hypertensive chronic kidney disease with stage 1 through stage 4 chronic kidney disease, or unspecified chronic kidney disease: Secondary | ICD-10-CM | POA: Diagnosis not present

## 2022-01-27 DIAGNOSIS — I129 Hypertensive chronic kidney disease with stage 1 through stage 4 chronic kidney disease, or unspecified chronic kidney disease: Secondary | ICD-10-CM | POA: Diagnosis not present

## 2022-01-27 DIAGNOSIS — I350 Nonrheumatic aortic (valve) stenosis: Secondary | ICD-10-CM | POA: Diagnosis not present

## 2022-01-27 DIAGNOSIS — E870 Hyperosmolality and hypernatremia: Secondary | ICD-10-CM | POA: Diagnosis not present

## 2022-01-27 DIAGNOSIS — R809 Proteinuria, unspecified: Secondary | ICD-10-CM | POA: Diagnosis not present

## 2022-01-27 DIAGNOSIS — N200 Calculus of kidney: Secondary | ICD-10-CM | POA: Diagnosis not present

## 2022-01-27 DIAGNOSIS — N184 Chronic kidney disease, stage 4 (severe): Secondary | ICD-10-CM | POA: Diagnosis not present

## 2022-01-28 DIAGNOSIS — M1990 Unspecified osteoarthritis, unspecified site: Secondary | ICD-10-CM | POA: Diagnosis not present

## 2022-01-28 DIAGNOSIS — F0150 Vascular dementia without behavioral disturbance: Secondary | ICD-10-CM | POA: Diagnosis not present

## 2022-01-28 DIAGNOSIS — R809 Proteinuria, unspecified: Secondary | ICD-10-CM | POA: Diagnosis not present

## 2022-01-28 DIAGNOSIS — Z0001 Encounter for general adult medical examination with abnormal findings: Secondary | ICD-10-CM | POA: Diagnosis not present

## 2022-01-28 DIAGNOSIS — I700 Atherosclerosis of aorta: Secondary | ICD-10-CM | POA: Diagnosis not present

## 2022-01-28 DIAGNOSIS — E876 Hypokalemia: Secondary | ICD-10-CM | POA: Diagnosis not present

## 2022-01-28 DIAGNOSIS — F17200 Nicotine dependence, unspecified, uncomplicated: Secondary | ICD-10-CM | POA: Diagnosis not present

## 2022-01-28 DIAGNOSIS — K449 Diaphragmatic hernia without obstruction or gangrene: Secondary | ICD-10-CM | POA: Diagnosis not present

## 2022-01-28 DIAGNOSIS — N184 Chronic kidney disease, stage 4 (severe): Secondary | ICD-10-CM | POA: Diagnosis not present

## 2022-01-28 DIAGNOSIS — E782 Mixed hyperlipidemia: Secondary | ICD-10-CM | POA: Diagnosis not present

## 2022-01-28 DIAGNOSIS — N3281 Overactive bladder: Secondary | ICD-10-CM | POA: Diagnosis not present

## 2022-01-28 DIAGNOSIS — I129 Hypertensive chronic kidney disease with stage 1 through stage 4 chronic kidney disease, or unspecified chronic kidney disease: Secondary | ICD-10-CM | POA: Diagnosis not present

## 2022-02-09 NOTE — Progress Notes (Signed)
Remote pacemaker transmission.   

## 2022-03-26 DIAGNOSIS — N184 Chronic kidney disease, stage 4 (severe): Secondary | ICD-10-CM | POA: Diagnosis not present

## 2022-03-26 DIAGNOSIS — E211 Secondary hyperparathyroidism, not elsewhere classified: Secondary | ICD-10-CM | POA: Diagnosis not present

## 2022-03-26 DIAGNOSIS — R809 Proteinuria, unspecified: Secondary | ICD-10-CM | POA: Diagnosis not present

## 2022-03-26 DIAGNOSIS — I129 Hypertensive chronic kidney disease with stage 1 through stage 4 chronic kidney disease, or unspecified chronic kidney disease: Secondary | ICD-10-CM | POA: Diagnosis not present

## 2022-03-26 DIAGNOSIS — E559 Vitamin D deficiency, unspecified: Secondary | ICD-10-CM | POA: Diagnosis not present

## 2022-03-26 DIAGNOSIS — N200 Calculus of kidney: Secondary | ICD-10-CM | POA: Diagnosis not present

## 2022-03-30 DIAGNOSIS — N184 Chronic kidney disease, stage 4 (severe): Secondary | ICD-10-CM | POA: Diagnosis not present

## 2022-03-30 DIAGNOSIS — R809 Proteinuria, unspecified: Secondary | ICD-10-CM | POA: Diagnosis not present

## 2022-03-30 DIAGNOSIS — I129 Hypertensive chronic kidney disease with stage 1 through stage 4 chronic kidney disease, or unspecified chronic kidney disease: Secondary | ICD-10-CM | POA: Diagnosis not present

## 2022-03-30 DIAGNOSIS — I350 Nonrheumatic aortic (valve) stenosis: Secondary | ICD-10-CM | POA: Diagnosis not present

## 2022-03-30 DIAGNOSIS — N200 Calculus of kidney: Secondary | ICD-10-CM | POA: Diagnosis not present

## 2022-05-10 ENCOUNTER — Ambulatory Visit (INDEPENDENT_AMBULATORY_CARE_PROVIDER_SITE_OTHER): Payer: PPO

## 2022-05-10 DIAGNOSIS — I495 Sick sinus syndrome: Secondary | ICD-10-CM

## 2022-05-10 LAB — CUP PACEART REMOTE DEVICE CHECK
Battery Remaining Longevity: 35 mo
Battery Remaining Percentage: 34 %
Battery Voltage: 2.96 V
Brady Statistic AP VP Percent: 23 %
Brady Statistic AP VS Percent: 49 %
Brady Statistic AS VP Percent: 1 %
Brady Statistic AS VS Percent: 27 %
Brady Statistic RA Percent Paced: 72 %
Brady Statistic RV Percent Paced: 23 %
Date Time Interrogation Session: 20240428165718
Implantable Lead Connection Status: 753985
Implantable Lead Connection Status: 753985
Implantable Lead Implant Date: 20161013
Implantable Lead Implant Date: 20161013
Implantable Lead Location: 753859
Implantable Lead Location: 753860
Implantable Pulse Generator Implant Date: 20161013
Lead Channel Impedance Value: 350 Ohm
Lead Channel Impedance Value: 380 Ohm
Lead Channel Pacing Threshold Amplitude: 0.75 V
Lead Channel Pacing Threshold Amplitude: 1 V
Lead Channel Pacing Threshold Pulse Width: 0.5 ms
Lead Channel Pacing Threshold Pulse Width: 0.5 ms
Lead Channel Sensing Intrinsic Amplitude: 1.3 mV
Lead Channel Sensing Intrinsic Amplitude: 12 mV
Lead Channel Setting Pacing Amplitude: 2 V
Lead Channel Setting Pacing Amplitude: 2.5 V
Lead Channel Setting Pacing Pulse Width: 0.5 ms
Lead Channel Setting Sensing Sensitivity: 2 mV
Pulse Gen Model: 2240
Pulse Gen Serial Number: 7820884

## 2022-05-19 DIAGNOSIS — N3281 Overactive bladder: Secondary | ICD-10-CM | POA: Diagnosis not present

## 2022-05-19 DIAGNOSIS — F0150 Vascular dementia without behavioral disturbance: Secondary | ICD-10-CM | POA: Diagnosis not present

## 2022-05-19 DIAGNOSIS — I129 Hypertensive chronic kidney disease with stage 1 through stage 4 chronic kidney disease, or unspecified chronic kidney disease: Secondary | ICD-10-CM | POA: Diagnosis not present

## 2022-05-19 DIAGNOSIS — F17200 Nicotine dependence, unspecified, uncomplicated: Secondary | ICD-10-CM | POA: Diagnosis not present

## 2022-05-19 DIAGNOSIS — F17210 Nicotine dependence, cigarettes, uncomplicated: Secondary | ICD-10-CM | POA: Diagnosis not present

## 2022-05-19 DIAGNOSIS — M1990 Unspecified osteoarthritis, unspecified site: Secondary | ICD-10-CM | POA: Diagnosis not present

## 2022-05-19 DIAGNOSIS — F0390 Unspecified dementia without behavioral disturbance: Secondary | ICD-10-CM | POA: Diagnosis not present

## 2022-05-19 DIAGNOSIS — J449 Chronic obstructive pulmonary disease, unspecified: Secondary | ICD-10-CM | POA: Diagnosis not present

## 2022-05-26 DIAGNOSIS — D509 Iron deficiency anemia, unspecified: Secondary | ICD-10-CM | POA: Diagnosis not present

## 2022-05-26 DIAGNOSIS — R809 Proteinuria, unspecified: Secondary | ICD-10-CM | POA: Diagnosis not present

## 2022-05-26 DIAGNOSIS — D631 Anemia in chronic kidney disease: Secondary | ICD-10-CM | POA: Diagnosis not present

## 2022-05-26 DIAGNOSIS — Z79899 Other long term (current) drug therapy: Secondary | ICD-10-CM | POA: Diagnosis not present

## 2022-05-26 DIAGNOSIS — N184 Chronic kidney disease, stage 4 (severe): Secondary | ICD-10-CM | POA: Diagnosis not present

## 2022-05-28 DIAGNOSIS — N184 Chronic kidney disease, stage 4 (severe): Secondary | ICD-10-CM | POA: Diagnosis not present

## 2022-05-28 DIAGNOSIS — R809 Proteinuria, unspecified: Secondary | ICD-10-CM | POA: Diagnosis not present

## 2022-05-28 DIAGNOSIS — N2581 Secondary hyperparathyroidism of renal origin: Secondary | ICD-10-CM | POA: Diagnosis not present

## 2022-05-28 DIAGNOSIS — D631 Anemia in chronic kidney disease: Secondary | ICD-10-CM | POA: Diagnosis not present

## 2022-06-10 NOTE — Progress Notes (Signed)
Remote pacemaker transmission.   

## 2022-07-23 DIAGNOSIS — E782 Mixed hyperlipidemia: Secondary | ICD-10-CM | POA: Diagnosis not present

## 2022-07-23 DIAGNOSIS — R7301 Impaired fasting glucose: Secondary | ICD-10-CM | POA: Diagnosis not present

## 2022-07-29 DIAGNOSIS — R809 Proteinuria, unspecified: Secondary | ICD-10-CM | POA: Diagnosis not present

## 2022-07-29 DIAGNOSIS — E782 Mixed hyperlipidemia: Secondary | ICD-10-CM | POA: Diagnosis not present

## 2022-07-29 DIAGNOSIS — F17210 Nicotine dependence, cigarettes, uncomplicated: Secondary | ICD-10-CM | POA: Diagnosis not present

## 2022-07-29 DIAGNOSIS — I350 Nonrheumatic aortic (valve) stenosis: Secondary | ICD-10-CM | POA: Diagnosis not present

## 2022-07-29 DIAGNOSIS — F0390 Unspecified dementia without behavioral disturbance: Secondary | ICD-10-CM | POA: Diagnosis not present

## 2022-07-29 DIAGNOSIS — F0150 Vascular dementia without behavioral disturbance: Secondary | ICD-10-CM | POA: Diagnosis not present

## 2022-07-29 DIAGNOSIS — M1990 Unspecified osteoarthritis, unspecified site: Secondary | ICD-10-CM | POA: Diagnosis not present

## 2022-07-29 DIAGNOSIS — I129 Hypertensive chronic kidney disease with stage 1 through stage 4 chronic kidney disease, or unspecified chronic kidney disease: Secondary | ICD-10-CM | POA: Diagnosis not present

## 2022-07-29 DIAGNOSIS — N189 Chronic kidney disease, unspecified: Secondary | ICD-10-CM | POA: Diagnosis not present

## 2022-07-29 DIAGNOSIS — N3281 Overactive bladder: Secondary | ICD-10-CM | POA: Diagnosis not present

## 2022-07-29 DIAGNOSIS — J449 Chronic obstructive pulmonary disease, unspecified: Secondary | ICD-10-CM | POA: Diagnosis not present

## 2022-07-29 DIAGNOSIS — N184 Chronic kidney disease, stage 4 (severe): Secondary | ICD-10-CM | POA: Diagnosis not present

## 2022-07-29 DIAGNOSIS — F17200 Nicotine dependence, unspecified, uncomplicated: Secondary | ICD-10-CM | POA: Diagnosis not present

## 2022-08-04 ENCOUNTER — Ambulatory Visit: Payer: PPO | Admitting: Urology

## 2022-08-04 DIAGNOSIS — N3941 Urge incontinence: Secondary | ICD-10-CM

## 2022-08-05 DIAGNOSIS — N179 Acute kidney failure, unspecified: Secondary | ICD-10-CM | POA: Diagnosis not present

## 2022-08-05 DIAGNOSIS — N185 Chronic kidney disease, stage 5: Secondary | ICD-10-CM | POA: Diagnosis not present

## 2022-08-05 DIAGNOSIS — R809 Proteinuria, unspecified: Secondary | ICD-10-CM | POA: Diagnosis not present

## 2022-08-05 DIAGNOSIS — E876 Hypokalemia: Secondary | ICD-10-CM | POA: Diagnosis not present

## 2022-08-06 ENCOUNTER — Encounter (HOSPITAL_COMMUNITY): Payer: Self-pay

## 2022-08-06 ENCOUNTER — Other Ambulatory Visit: Payer: Self-pay

## 2022-08-06 ENCOUNTER — Emergency Department (HOSPITAL_COMMUNITY): Payer: PPO

## 2022-08-06 ENCOUNTER — Emergency Department (HOSPITAL_COMMUNITY)
Admission: EM | Admit: 2022-08-06 | Discharge: 2022-08-06 | Disposition: A | Discharge status: Home / Self Care | Payer: PPO | Source: Home / Self Care | Attending: Emergency Medicine | Admitting: Emergency Medicine

## 2022-08-06 DIAGNOSIS — Z79899 Other long term (current) drug therapy: Secondary | ICD-10-CM | POA: Diagnosis not present

## 2022-08-06 DIAGNOSIS — Z950 Presence of cardiac pacemaker: Secondary | ICD-10-CM | POA: Diagnosis not present

## 2022-08-06 DIAGNOSIS — I10 Essential (primary) hypertension: Secondary | ICD-10-CM | POA: Diagnosis not present

## 2022-08-06 DIAGNOSIS — J441 Chronic obstructive pulmonary disease with (acute) exacerbation: Secondary | ICD-10-CM | POA: Diagnosis not present

## 2022-08-06 DIAGNOSIS — J449 Chronic obstructive pulmonary disease, unspecified: Secondary | ICD-10-CM | POA: Diagnosis not present

## 2022-08-06 DIAGNOSIS — R0602 Shortness of breath: Secondary | ICD-10-CM | POA: Diagnosis not present

## 2022-08-06 DIAGNOSIS — I700 Atherosclerosis of aorta: Secondary | ICD-10-CM | POA: Diagnosis not present

## 2022-08-06 DIAGNOSIS — I959 Hypotension, unspecified: Secondary | ICD-10-CM | POA: Diagnosis not present

## 2022-08-06 DIAGNOSIS — R7989 Other specified abnormal findings of blood chemistry: Secondary | ICD-10-CM | POA: Insufficient documentation

## 2022-08-06 DIAGNOSIS — R0600 Dyspnea, unspecified: Secondary | ICD-10-CM | POA: Diagnosis not present

## 2022-08-06 LAB — BASIC METABOLIC PANEL
Anion gap: 10 (ref 5–15)
BUN: 41 mg/dL — ABNORMAL HIGH (ref 8–23)
CO2: 23 mmol/L (ref 22–32)
Calcium: 8.8 mg/dL — ABNORMAL LOW (ref 8.9–10.3)
Chloride: 108 mmol/L (ref 98–111)
Creatinine, Ser: 2.45 mg/dL — ABNORMAL HIGH (ref 0.44–1.00)
GFR, Estimated: 18 mL/min — ABNORMAL LOW (ref >60)
Glucose, Bld: 114 mg/dL — ABNORMAL HIGH (ref 70–99)
Potassium: 3.4 mmol/L — ABNORMAL LOW (ref 3.5–5.1)
Sodium: 141 mmol/L (ref 135–145)

## 2022-08-06 LAB — CBC WITH DIFFERENTIAL/PLATELET
Abs Immature Granulocytes: 0.02 10*3/uL (ref 0.00–0.07)
Basophils Absolute: 0 10*3/uL (ref 0.0–0.1)
Basophils Relative: 0 %
Eosinophils Absolute: 0.3 10*3/uL (ref 0.0–0.5)
Eosinophils Relative: 3 %
HCT: 35.1 % — ABNORMAL LOW (ref 36.0–46.0)
Hemoglobin: 11.9 g/dL — ABNORMAL LOW (ref 12.0–15.0)
Immature Granulocytes: 0 %
Lymphocytes Relative: 12 %
Lymphs Abs: 0.9 10*3/uL (ref 0.7–4.0)
MCH: 31.5 pg (ref 26.0–34.0)
MCHC: 33.9 g/dL (ref 30.0–36.0)
MCV: 92.9 fL (ref 80.0–100.0)
Monocytes Absolute: 0.1 10*3/uL (ref 0.1–1.0)
Monocytes Relative: 2 %
Neutro Abs: 6.1 10*3/uL (ref 1.7–7.7)
Neutrophils Relative %: 83 %
Platelets: 120 10*3/uL — ABNORMAL LOW (ref 150–400)
RBC: 3.78 MIL/uL — ABNORMAL LOW (ref 3.87–5.11)
RDW: 13.7 % (ref 11.5–15.5)
WBC: 7.4 10*3/uL (ref 4.0–10.5)
nRBC: 0 % (ref 0.0–0.2)

## 2022-08-06 MED ORDER — IPRATROPIUM-ALBUTEROL 0.5-2.5 (3) MG/3ML IN SOLN
3.0000 mL | Freq: Once | RESPIRATORY_TRACT | Status: AC
  Administered 2022-08-06: 3 mL via RESPIRATORY_TRACT
  Filled 2022-08-06: qty 3

## 2022-08-06 MED ORDER — METHYLPREDNISOLONE SODIUM SUCC 125 MG IJ SOLR
80.0000 mg | Freq: Once | INTRAMUSCULAR | Status: AC
  Administered 2022-08-06: 80 mg via INTRAVENOUS
  Filled 2022-08-06: qty 2

## 2022-08-06 MED ORDER — ALBUTEROL SULFATE HFA 108 (90 BASE) MCG/ACT IN AERS: 2.0000 | INHALATION_SPRAY | RESPIRATORY_TRACT | Status: DC | PRN

## 2022-08-06 MED ORDER — PREDNISONE 10 MG PO TABS: 20.0000 mg | ORAL_TABLET | Freq: Two times a day (BID) | ORAL | 0 refills | Status: DC

## 2022-08-06 MED ORDER — ALBUTEROL SULFATE HFA 108 (90 BASE) MCG/ACT IN AERS
2.0000 | INHALATION_SPRAY | Freq: Once | RESPIRATORY_TRACT | Status: AC
  Administered 2022-08-06: 2 via RESPIRATORY_TRACT
  Filled 2022-08-06: qty 6.7

## 2022-08-06 NOTE — ED Provider Notes (Signed)
[place] EMERGENCY DEPARTMENT AT [facility] Provider Note   CSN: [id number] Arrival date & time: 08/06/22  0127     History  Chief Complaint  Patient presents with   Shortness of Breath    Ashley Olsen is a 89 y.o. female.  Patient is an 89 year old female with past medical history of hypertension hyperlipidemia, pacemaker placement, chronic renal insufficiency, diverticulitis and COPD.  Patient presenting today with complaints of shortness of breath.  This has apparently been worsening over the past week or so, then became worse tonight.  She describes dyspnea with exertion.  She denies chest pain, fevers, or cough.  No leg swelling.  The history is provided by the patient.       Home Medications Prior to Admission medications   Medication Sig Start Date End Date Taking? Authorizing Provider  acetaminophen (TYLENOL) 325 MG tablet Take 2 tablets (650 mg total) by mouth every 6 (six) hours as needed for mild pain, fever or headache (or Fever >/= 101). 06/22/19   Shon Hale, MD  amLODipine (NORVASC) 5 MG tablet Take 5 mg by mouth daily. 08/09/19   [provider]  cholecalciferol (VITAMIN D3) 25 MCG (1000 UNIT) tablet Take 1,000 Units by mouth daily.    [provider]  donepezil (ARICEPT) 10 MG tablet Take 10 mg by mouth daily. 11/09/19   [provider]  mirtazapine (REMERON) 15 MG tablet Take 1 tablet (15 mg total) by mouth at bedtime. 02/27/20   Anabel Halon, MD  potassium chloride SA (KLOR-CON M) 20 MEQ tablet Take 2 tablets (40 mEq total) by mouth 2 (two) times daily for 7 days. 01/12/22 01/19/22  Mesner, Barbara Cower, MD  simvastatin (ZOCOR) 20 MG tablet Take 1 tablet (20 mg total) by mouth daily with supper. 06/22/19   Shon Hale, MD  traMADol (ULTRAM) 50 MG tablet Take 50 mg by mouth every 12 (twelve) hours as needed for severe pain.    [provider]  Vibegron (GEMTESA) 75 MG TABS Take 1 tablet by mouth daily. 08/05/21    McKenzie, Mardene Celeste, MD      Allergies    Codeine and Sulfonamide derivatives    Review of Systems   Review of Systems  All other systems reviewed and are negative.   Physical Exam Updated Vital Signs BP 129/67   Pulse 75   Temp 98 F (36.7 C)   Resp 18   SpO2 94%  Physical Exam Vitals and nursing note reviewed.  Constitutional:      General: She is not in acute distress.    Appearance: She is well-developed. She is not diaphoretic.  HENT:     Head: Normocephalic and atraumatic.  Cardiovascular:     Rate and Rhythm: Normal rate and regular rhythm.     Heart sounds: No murmur heard.    No friction rub. No gallop.  Pulmonary:     Effort: Pulmonary effort is normal. No respiratory distress.     Breath sounds: Normal breath sounds. No wheezing.  Abdominal:     General: Bowel sounds are normal. There is no distension.     Palpations: Abdomen is soft.     Tenderness: There is no abdominal tenderness.  Musculoskeletal:        General: Normal range of motion.     Cervical back: Normal range of motion and neck supple.  Skin:    General: Skin is warm and dry.  Neurological:     General: No focal deficit  present.     Mental Status: She is alert and oriented to person, place, and time.     ED Results / Procedures / Treatments   Labs (all labs ordered are listed, but only abnormal results are displayed) Labs Reviewed  BASIC METABOLIC PANEL  CBC WITH DIFFERENTIAL/PLATELET    EKG EKG Interpretation Date/Time:  Friday August 06 2022 01:35:46 EDT Ventricular Rate:  67 PR Interval:  308 QRS Duration:  130 QT Interval:  536 QTC Calculation: 566 R Axis:   59  Text Interpretation: Atrial pacemaker Prolonged PR interval Nonspecific intraventricular conduction delay Abnrm T, consider ischemia, anterolateral lds Confirmed by Geoffery Lyons ([id number]) on 08/06/2022 1:53:14 AM  Radiology DG Chest 2 View  Result Date: 08/06/2022 CLINICAL DATA:  Shortness of breath EXAM: CHEST -  2 VIEW COMPARISON:  06/20/2019 FINDINGS: There is hyperinflation of the lungs compatible with COPD. Right pacer remains in place, unchanged. Heart and mediastinal contours are within normal limits. Aortic atherosclerosis. No confluent airspace opacity or effusion. No acute bony abnormality. IMPRESSION: Hyperinflation/COPD. No acute cardiopulmonary disease. Aortic atherosclerosis.  Shortness of breath Electronically Signed   By: Charlett Nose M.D.   On: 08/06/2022 02:20    Procedures Procedures    Medications Ordered in ED Medications  albuterol (VENTOLIN HFA) 108 (90 Base) MCG/ACT inhaler 2 puff (has no administration in time range)  ipratropium-albuterol (DUONEB) 0.5-2.5 (3) MG/3ML nebulizer solution 3 mL (has no administration in time range)  methylPREDNISolone sodium succinate (SOLU-MEDROL) 125 mg/2 mL injection 80 mg (has no administration in time range)    ED Course/ Medical Decision Making/ A&P  Patient present with shortness of breath as described in the HPI.  She has history of COPD and this appears to be a flareup of this.  Laboratory studies basically unremarkable.  Creatinine is elevated but consistent with baseline.  Chest x-ray showing COPD but no acute process.  She has received 2 albuterol nebs along with IV Solu-Medrol and seems to be feeling better.  Oxygen saturations are in the mid 90s on room air and patient is resting comfortably.  Feels that she can safely be discharged with albuterol MDI and a short course of prednisone.  To follow-up with primary doctor and return if she worsens.  Final Clinical Impression(s) / ED Diagnoses Final diagnoses:  None    Rx / DC Orders ED Discharge Orders     None         Geoffery Lyons, MD 08/06/22 [phone]

## 2022-08-06 NOTE — Discharge Instructions (Signed)
Begin taking prednisone as prescribed.  Use the albuterol inhaler 2 puffs every 4 hours as needed for difficulty breathing/wheezing.  Follow-up with your primary doctor and return to the ER if symptoms significantly worsen or change.

## 2022-08-06 NOTE — ED Triage Notes (Signed)
Pt c/o SOB that started tonight. Pt oxygen 91% on RA and placed on 3L and brought to 98%. Lung sounds clear.

## 2022-08-12 DIAGNOSIS — J449 Chronic obstructive pulmonary disease, unspecified: Secondary | ICD-10-CM | POA: Diagnosis not present

## 2022-08-12 DIAGNOSIS — I358 Other nonrheumatic aortic valve disorders: Secondary | ICD-10-CM | POA: Diagnosis not present

## 2022-08-12 DIAGNOSIS — F17200 Nicotine dependence, unspecified, uncomplicated: Secondary | ICD-10-CM | POA: Diagnosis not present

## 2022-08-13 ENCOUNTER — Telehealth: Payer: Self-pay

## 2022-08-13 NOTE — Telephone Encounter (Signed)
Transition Care Management Unsuccessful Follow-up Telephone Call  Date of discharge and from where:  [facility]  Attempts:  2nd Attempt  Reason for unsuccessful TCM follow-up call:  No answer/busy   [name] [facility] Guide, [facility] [phone] [address] [place], [place], [place] [address] Phone: [phone] Email: [email or web address]

## 2022-08-13 NOTE — Telephone Encounter (Signed)
Transition Care Management Unsuccessful Follow-up Telephone Call  Date of discharge and from where:  Ashley Olsen 7/26  Attempts:  1st Attempt  Reason for unsuccessful TCM follow-up call:  No answer/busy   [name] [facility] Guide, [facility] [phone] [address] [place], [place], [place] [address] Phone: [phone] Email: [email or web address]

## 2022-08-16 ENCOUNTER — Other Ambulatory Visit: Payer: Self-pay | Admitting: Urology

## 2022-08-16 DIAGNOSIS — N393 Stress incontinence (female) (male): Secondary | ICD-10-CM

## 2022-09-03 ENCOUNTER — Telehealth: Payer: Self-pay

## 2022-09-03 NOTE — Telephone Encounter (Signed)
Daughter Aram Beecham called to ask about a prescription hydrochloride  I reviewed medication history and informed daughter that is not a prescription we would fill. Daughter will call PCP

## 2022-10-26 DIAGNOSIS — Z23 Encounter for immunization: Secondary | ICD-10-CM | POA: Diagnosis not present

## 2022-12-01 ENCOUNTER — Ambulatory Visit (INDEPENDENT_AMBULATORY_CARE_PROVIDER_SITE_OTHER): Payer: PPO

## 2022-12-01 DIAGNOSIS — I495 Sick sinus syndrome: Secondary | ICD-10-CM | POA: Diagnosis not present

## 2022-12-01 LAB — CUP PACEART REMOTE DEVICE CHECK
Battery Remaining Longevity: 30 mo
Battery Remaining Percentage: 28 %
Battery Voltage: 2.95 V
Brady Statistic AP VP Percent: 23 %
Brady Statistic AP VS Percent: 49 %
Brady Statistic AS VP Percent: 1 %
Brady Statistic AS VS Percent: 28 %
Brady Statistic RA Percent Paced: 71 %
Brady Statistic RV Percent Paced: 23 %
Date Time Interrogation Session: 20241119183148
Implantable Lead Connection Status: 753985
Implantable Lead Connection Status: 753985
Implantable Lead Implant Date: 20161013
Implantable Lead Implant Date: 20161013
Implantable Lead Location: 753859
Implantable Lead Location: 753860
Implantable Pulse Generator Implant Date: 20161013
Lead Channel Impedance Value: 340 Ohm
Lead Channel Impedance Value: 390 Ohm
Lead Channel Pacing Threshold Amplitude: 0.75 V
Lead Channel Pacing Threshold Amplitude: 1 V
Lead Channel Pacing Threshold Pulse Width: 0.5 ms
Lead Channel Pacing Threshold Pulse Width: 0.5 ms
Lead Channel Sensing Intrinsic Amplitude: 1.3 mV
Lead Channel Sensing Intrinsic Amplitude: 11.3 mV
Lead Channel Setting Pacing Amplitude: 2 V
Lead Channel Setting Pacing Amplitude: 2.5 V
Lead Channel Setting Pacing Pulse Width: 0.5 ms
Lead Channel Setting Sensing Sensitivity: 2 mV
Pulse Gen Model: 2240
Pulse Gen Serial Number: 7820884

## 2022-12-13 ENCOUNTER — Telehealth: Payer: Self-pay | Admitting: Internal Medicine

## 2022-12-13 NOTE — Telephone Encounter (Signed)
Left detailed vm on daughters phone letting her know we are getting her transmissions

## 2022-12-13 NOTE — Telephone Encounter (Signed)
Patients daughter wants to make sure that the device clinic is receiving her mother's transmissions. Please advise.

## 2022-12-14 DIAGNOSIS — N189 Chronic kidney disease, unspecified: Secondary | ICD-10-CM | POA: Diagnosis not present

## 2022-12-14 DIAGNOSIS — R809 Proteinuria, unspecified: Secondary | ICD-10-CM | POA: Diagnosis not present

## 2022-12-14 DIAGNOSIS — D631 Anemia in chronic kidney disease: Secondary | ICD-10-CM | POA: Diagnosis not present

## 2022-12-14 DIAGNOSIS — E211 Secondary hyperparathyroidism, not elsewhere classified: Secondary | ICD-10-CM | POA: Diagnosis not present

## 2022-12-20 DIAGNOSIS — Z79899 Other long term (current) drug therapy: Secondary | ICD-10-CM | POA: Diagnosis not present

## 2022-12-20 DIAGNOSIS — N184 Chronic kidney disease, stage 4 (severe): Secondary | ICD-10-CM | POA: Diagnosis not present

## 2022-12-20 DIAGNOSIS — N2581 Secondary hyperparathyroidism of renal origin: Secondary | ICD-10-CM | POA: Diagnosis not present

## 2022-12-20 DIAGNOSIS — R809 Proteinuria, unspecified: Secondary | ICD-10-CM | POA: Diagnosis not present

## 2022-12-27 NOTE — Progress Notes (Signed)
Remote pacemaker transmission.   

## 2023-01-21 ENCOUNTER — Emergency Department (HOSPITAL_COMMUNITY): Payer: PPO

## 2023-01-21 ENCOUNTER — Other Ambulatory Visit: Payer: Self-pay

## 2023-01-21 ENCOUNTER — Emergency Department (HOSPITAL_COMMUNITY)
Admission: EM | Admit: 2023-01-21 | Discharge: 2023-01-21 | Disposition: A | Discharge status: Home / Self Care | Payer: PPO | Attending: Emergency Medicine | Admitting: Emergency Medicine

## 2023-01-21 ENCOUNTER — Encounter (HOSPITAL_COMMUNITY): Payer: Self-pay

## 2023-01-21 DIAGNOSIS — K5900 Constipation, unspecified: Secondary | ICD-10-CM | POA: Diagnosis not present

## 2023-01-21 DIAGNOSIS — F17200 Nicotine dependence, unspecified, uncomplicated: Secondary | ICD-10-CM | POA: Diagnosis not present

## 2023-01-21 DIAGNOSIS — K5730 Diverticulosis of large intestine without perforation or abscess without bleeding: Secondary | ICD-10-CM | POA: Diagnosis not present

## 2023-01-21 DIAGNOSIS — R935 Abnormal findings on diagnostic imaging of other abdominal regions, including retroperitoneum: Secondary | ICD-10-CM | POA: Diagnosis not present

## 2023-01-21 DIAGNOSIS — R109 Unspecified abdominal pain: Secondary | ICD-10-CM | POA: Diagnosis present

## 2023-01-21 LAB — CBC WITH DIFFERENTIAL/PLATELET
Abs Immature Granulocytes: 0.02 10*3/uL (ref 0.00–0.07)
Basophils Absolute: 0.1 10*3/uL (ref 0.0–0.1)
Basophils Relative: 1 %
Eosinophils Absolute: 0.6 10*3/uL — ABNORMAL HIGH (ref 0.0–0.5)
Eosinophils Relative: 8 %
HCT: 38.9 % (ref 36.0–46.0)
Hemoglobin: 12.4 g/dL (ref 12.0–15.0)
Immature Granulocytes: 0 %
Lymphocytes Relative: 13 %
Lymphs Abs: 1 10*3/uL (ref 0.7–4.0)
MCH: 30.8 pg (ref 26.0–34.0)
MCHC: 31.9 g/dL (ref 30.0–36.0)
MCV: 96.8 fL (ref 80.0–100.0)
Monocytes Absolute: 0.6 10*3/uL (ref 0.1–1.0)
Monocytes Relative: 7 %
Neutro Abs: 5.5 10*3/uL (ref 1.7–7.7)
Neutrophils Relative %: 71 %
Platelets: 153 10*3/uL (ref 150–400)
RBC: 4.02 MIL/uL (ref 3.87–5.11)
RDW: 13.4 % (ref 11.5–15.5)
WBC: 7.8 10*3/uL (ref 4.0–10.5)
nRBC: 0 % (ref 0.0–0.2)

## 2023-01-21 LAB — BASIC METABOLIC PANEL
Anion gap: 7 (ref 5–15)
BUN: 36 mg/dL — ABNORMAL HIGH (ref 8–23)
CO2: 22 mmol/L (ref 22–32)
Calcium: 9.3 mg/dL (ref 8.9–10.3)
Chloride: 111 mmol/L (ref 98–111)
Creatinine, Ser: 2.89 mg/dL — ABNORMAL HIGH (ref 0.44–1.00)
GFR, Estimated: 15 mL/min — ABNORMAL LOW (ref >60)
Glucose, Bld: 126 mg/dL — ABNORMAL HIGH (ref 70–99)
Potassium: 4.1 mmol/L (ref 3.5–5.1)
Sodium: 140 mmol/L (ref 135–145)

## 2023-01-21 MED ORDER — FENTANYL CITRATE PF 50 MCG/ML IJ SOSY
25.0000 ug | PREFILLED_SYRINGE | Freq: Once | INTRAMUSCULAR | Status: AC
  Administered 2023-01-21: 25 ug via INTRAVENOUS
  Filled 2023-01-21: qty 1

## 2023-01-21 MED ORDER — POLYETHYLENE GLYCOL 3350 17 G PO PACK
17.0000 g | PACK | Freq: Once | ORAL | Status: AC
  Administered 2023-01-21: 17 g via ORAL
  Filled 2023-01-21: qty 1

## 2023-01-21 MED ORDER — ONDANSETRON HCL 4 MG/2ML IJ SOLN
4.0000 mg | Freq: Once | INTRAMUSCULAR | Status: AC
  Administered 2023-01-21: 4 mg via INTRAVENOUS
  Filled 2023-01-21: qty 2

## 2023-01-21 MED ORDER — BISACODYL 5 MG PO TBEC
10.0000 mg | DELAYED_RELEASE_TABLET | Freq: Once | ORAL | Status: AC
  Administered 2023-01-21: 10 mg via ORAL
  Filled 2023-01-21: qty 2

## 2023-01-21 NOTE — Discharge Instructions (Addendum)
 There was a small spot on your liver. It is likely a cyst, please follow up with an MRI through your primary care doctor Get help right away if: You have a fever and your symptoms suddenly get worse. You leak stool or have blood in your stool. Your abdomen is bloated. You have severe pain in your abdomen. You feel dizzy or you faint  Constipation: Cleanout Action Plan   The first step to treating your constipation is a good cleanout with a stool softener and a stimulant laxative. Then, in the "maintenance phase", you  -*will take a daily dose of stool softener for at least several months to a year. The goal is to have several bowel movements that are loose or watery. You will take two medicines Cleanout medicine 1: Stool softener - polyethylene glycol (Miralax , Glycolax  or PEG) Polyethylene glycol brings water  into the bowels. Mix 6 capfuls of Miralax  in 32 oz of water  or Gatorade Plan to give 4 ounces every 15 minutes or 8 ounces every 30 minutes until complete.  You may use clear liquid such as juice, water  or tea. Drink lots of liquids when you are on these medications to prevent dehydration. Cleanout medicine 2: Stimulant laxatives - take 2 caplets of Bisacodyl  before bedtime Give as directed. Plan to repeat this cleanout in one week.   Part Two: Maintenance Phase to keep bowels regular Long-term daily stool softener given for at least 6 to 12 months. As soon as you complete the first cleanout, Take polyethylene glycol (Miralax ) once daily as indicated. It needs to be taken daily for at least 6 to 12 months and often longer. Mix the medicine with liquid, such as juice, tea or water . It's very important to mix the medicine with the full amount of liquid suggested. You can increase or decrease the dose as needed to achieve mashed potato consistency stools.

## 2023-01-21 NOTE — ED Provider Notes (Signed)
 [place] EMERGENCY DEPARTMENT AT [facility] Provider Note   CSN: [id number] Arrival date & time: 01/21/23  1933     History  Chief Complaint  Patient presents with   Abdominal Pain    Ashley Olsen is a 89 y.o. female presents emergency department with abdominal pain.  Patient believes this is secondary to constipation.  She made a bowel movement yesterday but only has a small hard stool.  Patient feels like this is similar to past bouts with constipation.  She drank 2 cups of prune juice but has not tried anything else. Denies urinary sxs, nausea, vomiting.   Abdominal Pain      Home Medications Prior to Admission medications   Medication Sig Start Date End Date Taking? Authorizing Provider  acetaminophen  (TYLENOL ) 325 MG tablet Take 2 tablets (650 mg total) by mouth every 6 (six) hours as needed for mild pain, fever or headache (or Fever >/= 101). 06/22/19   Pearlean Manus, MD  amLODipine  (NORVASC ) 5 MG tablet Take 5 mg by mouth daily. 08/09/19   [provider]  cholecalciferol (VITAMIN D3) 25 MCG (1000 UNIT) tablet Take 1,000 Units by mouth daily.    [provider]  donepezil (ARICEPT) 10 MG tablet Take 10 mg by mouth daily. 11/09/19   [provider]  GEMTESA  75 MG TABS TAKE ONE TABLET BY MOUTH ONCE daily 08/30/22   McKenzie, Belvie CROME, MD  mirtazapine  (REMERON ) 15 MG tablet Take 1 tablet (15 mg total) by mouth at bedtime. 02/27/20   Tobie Suzzane POUR, MD  potassium chloride  SA (KLOR-CON  M) 20 MEQ tablet Take 2 tablets (40 mEq total) by mouth 2 (two) times daily for 7 days. 01/12/22 01/19/22  Mesner, Selinda, MD  predniSONE  (DELTASONE ) 10 MG tablet Take 2 tablets (20 mg total) by mouth 2 (two) times daily. 08/06/22   Geroldine Berg, MD  simvastatin  (ZOCOR ) 20 MG tablet Take 1 tablet (20 mg total) by mouth daily with supper. 06/22/19   Pearlean Manus, MD  traMADol  (ULTRAM ) 50 MG tablet Take 50 mg by mouth every 12 (twelve) hours as needed for  severe pain.    [provider]      Allergies    Codeine and Sulfonamide derivatives    Review of Systems   Review of Systems  Gastrointestinal:  Positive for abdominal pain.    Physical Exam Updated Vital Signs BP 113/74 (BP Location: Right Arm)   Pulse 73   Temp 98.2 F (36.8 C) (Oral)   Resp 17   Ht 5' 3 (1.6 m)   Wt 63 kg   SpO2 94%   BMI 24.60 kg/m  Physical Exam Vitals and nursing note reviewed.  Constitutional:      General: She is not in acute distress.    Appearance: She is well-developed. She is not diaphoretic.  HENT:     Head: Normocephalic and atraumatic.     Right Ear: External ear normal.     Left Ear: External ear normal.     Nose: Nose normal.     Mouth/Throat:     Mouth: Mucous membranes are moist.  Eyes:     General: No scleral icterus.    Conjunctiva/sclera: Conjunctivae normal.  Cardiovascular:     Rate and Rhythm: Normal rate and regular rhythm.     Heart sounds: Normal heart sounds. No murmur heard.    No friction rub. No gallop.  Pulmonary:     Effort: Pulmonary effort is normal. No respiratory distress.  Breath sounds: Normal breath sounds.  Abdominal:     General: Bowel sounds are normal. There is no distension.     Palpations: Abdomen is soft. There is no mass.     Tenderness: There is no abdominal tenderness. There is no guarding.     Hernia: No hernia is present.  Musculoskeletal:     Cervical back: Normal range of motion.  Skin:    General: Skin is warm and dry.  Neurological:     Mental Status: She is alert and oriented to person, place, and time.  Psychiatric:        Behavior: Behavior normal.     ED Results / Procedures / Treatments   Labs (all labs ordered are listed, but only abnormal results are displayed) Labs Reviewed - No data to display  EKG None  Radiology No results found.  Procedures Procedures    Medications Ordered in ED Medications - No data to display  ED Course/ Medical  Decision Making/ A&P                                 Medical Decision Making Amount and/or Complexity of Data Reviewed Labs: ordered. Radiology: ordered.  Risk OTC drugs. Prescription drug management.    This patient presents to the ED for concern of constipation and abdominal pain , this involves an extensive number of treatment options, and is a complaint that carries with it a high risk of complications and morbidity.  The differential diagnosis for generalized abdominal pain includes, but is not limited to AAA, gastroenteritis, appendicitis, Bowel obstruction, Bowel perforation. Gastroparesis, DKA, Hernia, Inflammatory bowel disease, mesenteric ischemia, pancreatitis, peritonitis SBP, volvulus.   Co morbidities:   has a past medical history of Acute diverticulitis (07/05/2013), Allergy, Anemia, Arthritis, Blood in urine, Cataract, CKD (chronic kidney disease), COPD (chronic obstructive pulmonary disease) (HCC), Diverticulitis (Recurrent), H/O hiatal hernia, Heart murmur, History of blood clots, Hyperlipidemia, Hypertension, Osteoporosis, Pacemaker, Pain in joint, shoulder region, Rectal bleeding (08/09/2019), Sinoatrial node dysfunction (HCC), Thrombocytopenia (HCC) (07/05/2013), and Vitamin B12 deficiency (07/05/2013).   Social Determinants of Health:   SDOH Screenings   Depression (PHQ2-9): Low Risk  (02/27/2020)  Social Connections: Unknown (03/10/2022)   Received from [facility]  Tobacco Use: High Risk (01/21/2023)     Additional history:  {Additional history obtained from family {External records from outside source obtained and reviewed including Care everywhere notes and OP nephrology  Lab Tests:  I Ordered, and personally interpreted labs.  The pertinent results include:   BMP shows chronic renal insufficiency, CBC without acute finding. Imaging Studies:  I ordered imaging studies including CT abdomen pelvis I independently visualized and  interpreted imaging which showed large amount of bowel gas in the colon, stool throughout the colon, no other acute findings I agree with the radiologist interpretation    Medicines ordered and prescription drug management:  I ordered medication including  Medications  fentaNYL  (SUBLIMAZE ) injection 25 mcg (25 mcg Intravenous Given 01/21/23 2102)  ondansetron  (ZOFRAN ) injection 4 mg (4 mg Intravenous Given 01/21/23 2101)  bisacodyl  (DULCOLAX) EC tablet 10 mg (10 mg Oral Given 01/21/23 2246)  polyethylene glycol (MIRALAX  / GLYCOLAX ) packet 17 g (17 g Oral Given 01/21/23 2246)   for constipation prior to discharge  Test Considered:   considered a CT angiogram given her history of CAD however symptoms are not consistent with acute bowel ischemia.  Critical Interventions:   pain medication  Consultations Obtained:   Problem List / ED Course:     ICD-10-CM   1. Constipation, unspecified constipation type  K59.00       MDM: Patient here for constipation abdominal pain, exam and findings are all reassuring.  She does not appear to have any signs or symptoms of fecal impaction.  Discharged with instructions for use of MiraLAX  and stimulant laxative.  Discussed outpatient follow-up and return precautions and chronic maintenance of stools.   Dispostion:  After consideration of the diagnostic results and the patients response to treatment, I feel that the patent would benefit from discharge.         Final Clinical Impression(s) / ED Diagnoses Final diagnoses:  Constipation, unspecified constipation type    Rx / DC Orders ED Discharge Orders     None         Arloa Chroman, PA-C 01/21/23 2251    Cleotilde Rogue, MD 01/22/23 0900

## 2023-01-21 NOTE — ED Triage Notes (Signed)
 Pt reports abd pain intermittently for past few days, reports constipation with small hard balls for several days. Pt says pain got worse after dinner tonight. Pt lives with daughter who brought pt here.

## 2023-01-21 NOTE — ED Notes (Signed)
 Patient ambulatory without assist to restroom. Family at bedside.

## 2023-01-27 DIAGNOSIS — R7301 Impaired fasting glucose: Secondary | ICD-10-CM | POA: Diagnosis not present

## 2023-01-27 DIAGNOSIS — E782 Mixed hyperlipidemia: Secondary | ICD-10-CM | POA: Diagnosis not present

## 2023-02-01 DIAGNOSIS — N184 Chronic kidney disease, stage 4 (severe): Secondary | ICD-10-CM | POA: Diagnosis not present

## 2023-02-01 DIAGNOSIS — F17210 Nicotine dependence, cigarettes, uncomplicated: Secondary | ICD-10-CM | POA: Diagnosis not present

## 2023-02-01 DIAGNOSIS — J449 Chronic obstructive pulmonary disease, unspecified: Secondary | ICD-10-CM | POA: Diagnosis not present

## 2023-02-01 DIAGNOSIS — N3281 Overactive bladder: Secondary | ICD-10-CM | POA: Diagnosis not present

## 2023-02-01 DIAGNOSIS — M1990 Unspecified osteoarthritis, unspecified site: Secondary | ICD-10-CM | POA: Diagnosis not present

## 2023-02-01 DIAGNOSIS — I129 Hypertensive chronic kidney disease with stage 1 through stage 4 chronic kidney disease, or unspecified chronic kidney disease: Secondary | ICD-10-CM | POA: Diagnosis not present

## 2023-02-01 DIAGNOSIS — N3941 Urge incontinence: Secondary | ICD-10-CM | POA: Diagnosis not present

## 2023-02-01 DIAGNOSIS — F0150 Vascular dementia without behavioral disturbance: Secondary | ICD-10-CM | POA: Diagnosis not present

## 2023-02-01 DIAGNOSIS — R809 Proteinuria, unspecified: Secondary | ICD-10-CM | POA: Diagnosis not present

## 2023-02-01 DIAGNOSIS — F17200 Nicotine dependence, unspecified, uncomplicated: Secondary | ICD-10-CM | POA: Diagnosis not present

## 2023-02-01 DIAGNOSIS — K5900 Constipation, unspecified: Secondary | ICD-10-CM | POA: Diagnosis not present

## 2023-02-01 DIAGNOSIS — E782 Mixed hyperlipidemia: Secondary | ICD-10-CM | POA: Diagnosis not present

## 2023-03-02 ENCOUNTER — Ambulatory Visit (INDEPENDENT_AMBULATORY_CARE_PROVIDER_SITE_OTHER): Payer: PPO

## 2023-03-02 DIAGNOSIS — I495 Sick sinus syndrome: Secondary | ICD-10-CM

## 2023-03-03 LAB — CUP PACEART REMOTE DEVICE CHECK
Battery Remaining Longevity: 27 mo
Battery Remaining Percentage: 26 %
Battery Voltage: 2.93 V
Brady Statistic AP VP Percent: 24 %
Brady Statistic AP VS Percent: 47 %
Brady Statistic AS VP Percent: 1 %
Brady Statistic AS VS Percent: 29 %
Brady Statistic RA Percent Paced: 71 %
Brady Statistic RV Percent Paced: 24 %
Date Time Interrogation Session: 20250219020014
Implantable Lead Connection Status: 753985
Implantable Lead Connection Status: 753985
Implantable Lead Implant Date: 20161013
Implantable Lead Implant Date: 20161013
Implantable Lead Location: 753859
Implantable Lead Location: 753860
Implantable Pulse Generator Implant Date: 20161013
Lead Channel Impedance Value: 340 Ohm
Lead Channel Impedance Value: 350 Ohm
Lead Channel Pacing Threshold Amplitude: 0.75 V
Lead Channel Pacing Threshold Amplitude: 1 V
Lead Channel Pacing Threshold Pulse Width: 0.5 ms
Lead Channel Pacing Threshold Pulse Width: 0.5 ms
Lead Channel Sensing Intrinsic Amplitude: 1 mV
Lead Channel Sensing Intrinsic Amplitude: 12 mV
Lead Channel Setting Pacing Amplitude: 2 V
Lead Channel Setting Pacing Amplitude: 2.5 V
Lead Channel Setting Pacing Pulse Width: 0.5 ms
Lead Channel Setting Sensing Sensitivity: 2 mV
Pulse Gen Model: 2240
Pulse Gen Serial Number: 7820884

## 2023-03-09 ENCOUNTER — Ambulatory Visit: Payer: PPO | Attending: Internal Medicine | Admitting: Internal Medicine

## 2023-03-09 ENCOUNTER — Encounter: Payer: Self-pay | Admitting: Internal Medicine

## 2023-03-09 VITALS — BP 126/80 | HR 78 | Ht 63.0 in | Wt 113.4 lb

## 2023-03-09 DIAGNOSIS — I495 Sick sinus syndrome: Secondary | ICD-10-CM | POA: Diagnosis not present
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
## 2023-03-09 NOTE — Patient Instructions (Signed)

## 2023-03-14 LAB — CUP PACEART INCLINIC DEVICE CHECK
Battery Remaining Longevity: 28 mo
Battery Voltage: 2.93 V
Brady Statistic RA Percent Paced: 70 %
Brady Statistic RV Percent Paced: 25 %
Date Time Interrogation Session: 20250226105000
Implantable Lead Connection Status: 753985
Implantable Lead Connection Status: 753985
Implantable Lead Implant Date: 20161013
Implantable Lead Implant Date: 20161013
Implantable Lead Location: 753859
Implantable Lead Location: 753860
Implantable Pulse Generator Implant Date: 20161013
Lead Channel Impedance Value: 350 Ohm
Lead Channel Impedance Value: 375 Ohm
Lead Channel Pacing Threshold Amplitude: 0.5 V
Lead Channel Pacing Threshold Amplitude: 0.5 V
Lead Channel Pacing Threshold Amplitude: 1.25 V
Lead Channel Pacing Threshold Amplitude: 1.25 V
Lead Channel Pacing Threshold Pulse Width: 0.5 ms
Lead Channel Pacing Threshold Pulse Width: 0.5 ms
Lead Channel Pacing Threshold Pulse Width: 0.5 ms
Lead Channel Pacing Threshold Pulse Width: 0.5 ms
Lead Channel Sensing Intrinsic Amplitude: 1 mV
Lead Channel Sensing Intrinsic Amplitude: 12 mV
Lead Channel Setting Pacing Amplitude: 2 V
Lead Channel Setting Pacing Amplitude: 2.5 V
Lead Channel Setting Pacing Pulse Width: 0.5 ms
Lead Channel Setting Sensing Sensitivity: 2 mV
Pulse Gen Model: 2240
Pulse Gen Serial Number: 7820884

## 2023-04-04 DIAGNOSIS — N189 Chronic kidney disease, unspecified: Secondary | ICD-10-CM | POA: Diagnosis not present

## 2023-04-04 DIAGNOSIS — E211 Secondary hyperparathyroidism, not elsewhere classified: Secondary | ICD-10-CM | POA: Diagnosis not present

## 2023-04-04 DIAGNOSIS — D631 Anemia in chronic kidney disease: Secondary | ICD-10-CM | POA: Diagnosis not present

## 2023-04-04 DIAGNOSIS — R809 Proteinuria, unspecified: Secondary | ICD-10-CM | POA: Diagnosis not present

## 2023-04-06 NOTE — Addendum Note (Signed)
 Addended by: Elease Etienne A on: 04/06/2023 11:11 AM   Modules accepted: Orders

## 2023-04-06 NOTE — Progress Notes (Signed)
 Remote pacemaker transmission.

## 2023-04-13 DIAGNOSIS — I350 Nonrheumatic aortic (valve) stenosis: Secondary | ICD-10-CM | POA: Diagnosis not present

## 2023-04-13 DIAGNOSIS — N2581 Secondary hyperparathyroidism of renal origin: Secondary | ICD-10-CM | POA: Diagnosis not present

## 2023-04-13 DIAGNOSIS — R809 Proteinuria, unspecified: Secondary | ICD-10-CM | POA: Diagnosis not present

## 2023-04-13 DIAGNOSIS — N184 Chronic kidney disease, stage 4 (severe): Secondary | ICD-10-CM | POA: Diagnosis not present

## 2023-06-01 ENCOUNTER — Ambulatory Visit (INDEPENDENT_AMBULATORY_CARE_PROVIDER_SITE_OTHER): Payer: PPO

## 2023-06-01 DIAGNOSIS — I495 Sick sinus syndrome: Secondary | ICD-10-CM | POA: Diagnosis not present

## 2023-06-01 LAB — CUP PACEART REMOTE DEVICE CHECK
Battery Remaining Longevity: 25 mo
Battery Remaining Percentage: 23 %
Battery Voltage: 2.92 V
Brady Statistic AP VP Percent: 31 %
Brady Statistic AP VS Percent: 37 %
Brady Statistic AS VP Percent: 1 %
Brady Statistic AS VS Percent: 32 %
Brady Statistic RA Percent Paced: 67 %
Brady Statistic RV Percent Paced: 31 %
Date Time Interrogation Session: 20250521025523
Implantable Lead Connection Status: 753985
Implantable Lead Connection Status: 753985
Implantable Lead Implant Date: 20161013
Implantable Lead Implant Date: 20161013
Implantable Lead Location: 753859
Implantable Lead Location: 753860
Implantable Pulse Generator Implant Date: 20161013
Lead Channel Impedance Value: 360 Ohm
Lead Channel Impedance Value: 400 Ohm
Lead Channel Pacing Threshold Amplitude: 0.5 V
Lead Channel Pacing Threshold Amplitude: 1.25 V
Lead Channel Pacing Threshold Pulse Width: 0.5 ms
Lead Channel Pacing Threshold Pulse Width: 0.5 ms
Lead Channel Sensing Intrinsic Amplitude: 1.2 mV
Lead Channel Sensing Intrinsic Amplitude: 12 mV
Lead Channel Setting Pacing Amplitude: 2 V
Lead Channel Setting Pacing Amplitude: 2.5 V
Lead Channel Setting Pacing Pulse Width: 0.5 ms
Lead Channel Setting Sensing Sensitivity: 2 mV
Pulse Gen Model: 2240
Pulse Gen Serial Number: 7820884

## 2023-06-05 ENCOUNTER — Ambulatory Visit: Payer: Self-pay | Admitting: Internal Medicine

## 2023-07-14 ENCOUNTER — Other Ambulatory Visit (HOSPITAL_COMMUNITY): Payer: Self-pay | Admitting: Internal Medicine

## 2023-07-14 DIAGNOSIS — J449 Chronic obstructive pulmonary disease, unspecified: Secondary | ICD-10-CM | POA: Diagnosis not present

## 2023-07-14 DIAGNOSIS — N189 Chronic kidney disease, unspecified: Secondary | ICD-10-CM | POA: Diagnosis not present

## 2023-07-14 DIAGNOSIS — N3941 Urge incontinence: Secondary | ICD-10-CM | POA: Diagnosis not present

## 2023-07-14 DIAGNOSIS — D631 Anemia in chronic kidney disease: Secondary | ICD-10-CM | POA: Diagnosis not present

## 2023-07-14 DIAGNOSIS — I129 Hypertensive chronic kidney disease with stage 1 through stage 4 chronic kidney disease, or unspecified chronic kidney disease: Secondary | ICD-10-CM | POA: Diagnosis not present

## 2023-07-14 DIAGNOSIS — M5459 Other low back pain: Secondary | ICD-10-CM | POA: Diagnosis not present

## 2023-07-14 DIAGNOSIS — M1990 Unspecified osteoarthritis, unspecified site: Secondary | ICD-10-CM | POA: Diagnosis not present

## 2023-07-14 DIAGNOSIS — I350 Nonrheumatic aortic (valve) stenosis: Secondary | ICD-10-CM | POA: Diagnosis not present

## 2023-07-14 DIAGNOSIS — I10 Essential (primary) hypertension: Secondary | ICD-10-CM | POA: Diagnosis not present

## 2023-07-14 DIAGNOSIS — G8929 Other chronic pain: Secondary | ICD-10-CM

## 2023-07-14 DIAGNOSIS — R809 Proteinuria, unspecified: Secondary | ICD-10-CM | POA: Diagnosis not present

## 2023-07-14 DIAGNOSIS — N184 Chronic kidney disease, stage 4 (severe): Secondary | ICD-10-CM | POA: Diagnosis not present

## 2023-07-14 DIAGNOSIS — I498 Other specified cardiac arrhythmias: Secondary | ICD-10-CM | POA: Diagnosis not present

## 2023-07-14 DIAGNOSIS — N3281 Overactive bladder: Secondary | ICD-10-CM | POA: Diagnosis not present

## 2023-07-14 DIAGNOSIS — F17210 Nicotine dependence, cigarettes, uncomplicated: Secondary | ICD-10-CM | POA: Diagnosis not present

## 2023-07-14 DIAGNOSIS — F0150 Vascular dementia without behavioral disturbance: Secondary | ICD-10-CM | POA: Diagnosis not present

## 2023-07-20 DIAGNOSIS — N2581 Secondary hyperparathyroidism of renal origin: Secondary | ICD-10-CM | POA: Diagnosis not present

## 2023-07-20 DIAGNOSIS — I350 Nonrheumatic aortic (valve) stenosis: Secondary | ICD-10-CM | POA: Diagnosis not present

## 2023-07-20 DIAGNOSIS — R809 Proteinuria, unspecified: Secondary | ICD-10-CM | POA: Diagnosis not present

## 2023-07-20 DIAGNOSIS — N184 Chronic kidney disease, stage 4 (severe): Secondary | ICD-10-CM | POA: Diagnosis not present

## 2023-07-21 ENCOUNTER — Ambulatory Visit (HOSPITAL_COMMUNITY)
Admission: RE | Admit: 2023-07-21 | Discharge: 2023-07-21 | Disposition: A | Discharge status: Home / Self Care | Source: Ambulatory Visit | Attending: Internal Medicine | Admitting: Internal Medicine

## 2023-07-21 DIAGNOSIS — G8929 Other chronic pain: Secondary | ICD-10-CM | POA: Insufficient documentation

## 2023-07-21 DIAGNOSIS — M5459 Other low back pain: Secondary | ICD-10-CM | POA: Insufficient documentation

## 2023-07-21 DIAGNOSIS — M545 Low back pain, unspecified: Secondary | ICD-10-CM | POA: Diagnosis not present

## 2023-07-21 NOTE — Progress Notes (Signed)
 Remote pacemaker transmission.

## 2023-08-16 DIAGNOSIS — R7301 Impaired fasting glucose: Secondary | ICD-10-CM | POA: Diagnosis not present

## 2023-08-16 DIAGNOSIS — E782 Mixed hyperlipidemia: Secondary | ICD-10-CM | POA: Diagnosis not present

## 2023-08-22 DIAGNOSIS — M1990 Unspecified osteoarthritis, unspecified site: Secondary | ICD-10-CM | POA: Diagnosis not present

## 2023-08-22 DIAGNOSIS — F17200 Nicotine dependence, unspecified, uncomplicated: Secondary | ICD-10-CM | POA: Diagnosis not present

## 2023-08-22 DIAGNOSIS — N3941 Urge incontinence: Secondary | ICD-10-CM | POA: Diagnosis not present

## 2023-08-22 DIAGNOSIS — M5459 Other low back pain: Secondary | ICD-10-CM | POA: Diagnosis not present

## 2023-08-22 DIAGNOSIS — I350 Nonrheumatic aortic (valve) stenosis: Secondary | ICD-10-CM | POA: Diagnosis not present

## 2023-08-22 DIAGNOSIS — I129 Hypertensive chronic kidney disease with stage 1 through stage 4 chronic kidney disease, or unspecified chronic kidney disease: Secondary | ICD-10-CM | POA: Diagnosis not present

## 2023-08-22 DIAGNOSIS — F0150 Vascular dementia without behavioral disturbance: Secondary | ICD-10-CM | POA: Diagnosis not present

## 2023-08-22 DIAGNOSIS — I498 Other specified cardiac arrhythmias: Secondary | ICD-10-CM | POA: Diagnosis not present

## 2023-08-22 DIAGNOSIS — J449 Chronic obstructive pulmonary disease, unspecified: Secondary | ICD-10-CM | POA: Diagnosis not present

## 2023-08-22 DIAGNOSIS — G8929 Other chronic pain: Secondary | ICD-10-CM | POA: Diagnosis not present

## 2023-08-22 DIAGNOSIS — N3281 Overactive bladder: Secondary | ICD-10-CM | POA: Diagnosis not present

## 2023-08-22 DIAGNOSIS — E782 Mixed hyperlipidemia: Secondary | ICD-10-CM | POA: Diagnosis not present

## 2023-08-24 ENCOUNTER — Encounter: Payer: Self-pay | Admitting: Internal Medicine

## 2023-08-24 DIAGNOSIS — D3131 Benign neoplasm of right choroid: Secondary | ICD-10-CM | POA: Diagnosis not present

## 2023-08-29 ENCOUNTER — Other Ambulatory Visit: Payer: Self-pay | Admitting: Urology

## 2023-08-29 DIAGNOSIS — N393 Stress incontinence (female) (male): Secondary | ICD-10-CM

## 2023-08-31 ENCOUNTER — Ambulatory Visit (INDEPENDENT_AMBULATORY_CARE_PROVIDER_SITE_OTHER): Payer: PPO

## 2023-08-31 DIAGNOSIS — I495 Sick sinus syndrome: Secondary | ICD-10-CM | POA: Diagnosis not present

## 2023-09-01 LAB — CUP PACEART REMOTE DEVICE CHECK
Battery Remaining Longevity: 19 mo
Battery Remaining Percentage: 21 %
Battery Voltage: 2.9 V
Brady Statistic AP VP Percent: 39 %
Brady Statistic AP VS Percent: 32 %
Brady Statistic AS VP Percent: 1 %
Brady Statistic AS VS Percent: 29 %
Brady Statistic RA Percent Paced: 70 %
Brady Statistic RV Percent Paced: 39 %
Date Time Interrogation Session: 20250820020014
Implantable Lead Connection Status: 753985
Implantable Lead Connection Status: 753985
Implantable Lead Implant Date: 20161013
Implantable Lead Implant Date: 20161013
Implantable Lead Location: 753859
Implantable Lead Location: 753860
Implantable Pulse Generator Implant Date: 20161013
Lead Channel Impedance Value: 310 Ohm
Lead Channel Impedance Value: 330 Ohm
Lead Channel Pacing Threshold Amplitude: 0.5 V
Lead Channel Pacing Threshold Amplitude: 1.25 V
Lead Channel Pacing Threshold Pulse Width: 0.5 ms
Lead Channel Pacing Threshold Pulse Width: 0.5 ms
Lead Channel Sensing Intrinsic Amplitude: 1.3 mV
Lead Channel Sensing Intrinsic Amplitude: 12 mV
Lead Channel Setting Pacing Amplitude: 2 V
Lead Channel Setting Pacing Amplitude: 2.5 V
Lead Channel Setting Pacing Pulse Width: 0.5 ms
Lead Channel Setting Sensing Sensitivity: 2 mV
Pulse Gen Model: 2240
Pulse Gen Serial Number: 7820884

## 2023-09-04 ENCOUNTER — Ambulatory Visit: Payer: Self-pay | Admitting: Internal Medicine

## 2023-09-15 DIAGNOSIS — N184 Chronic kidney disease, stage 4 (severe): Secondary | ICD-10-CM | POA: Diagnosis not present

## 2023-09-15 DIAGNOSIS — E876 Hypokalemia: Secondary | ICD-10-CM | POA: Diagnosis not present

## 2023-09-15 DIAGNOSIS — I350 Nonrheumatic aortic (valve) stenosis: Secondary | ICD-10-CM | POA: Diagnosis not present

## 2023-09-15 DIAGNOSIS — R809 Proteinuria, unspecified: Secondary | ICD-10-CM | POA: Diagnosis not present

## 2023-10-05 NOTE — Progress Notes (Signed)
 Remote PPM Transmission

## 2023-10-19 DIAGNOSIS — D631 Anemia in chronic kidney disease: Secondary | ICD-10-CM | POA: Diagnosis not present

## 2023-10-19 DIAGNOSIS — E211 Secondary hyperparathyroidism, not elsewhere classified: Secondary | ICD-10-CM | POA: Diagnosis not present

## 2023-10-19 DIAGNOSIS — R809 Proteinuria, unspecified: Secondary | ICD-10-CM | POA: Diagnosis not present

## 2023-10-27 DIAGNOSIS — R809 Proteinuria, unspecified: Secondary | ICD-10-CM | POA: Diagnosis not present

## 2023-10-27 DIAGNOSIS — N184 Chronic kidney disease, stage 4 (severe): Secondary | ICD-10-CM | POA: Diagnosis not present

## 2023-10-27 DIAGNOSIS — N2581 Secondary hyperparathyroidism of renal origin: Secondary | ICD-10-CM | POA: Diagnosis not present

## 2023-10-27 DIAGNOSIS — I129 Hypertensive chronic kidney disease with stage 1 through stage 4 chronic kidney disease, or unspecified chronic kidney disease: Secondary | ICD-10-CM | POA: Diagnosis not present

## 2023-11-30 ENCOUNTER — Ambulatory Visit: Payer: PPO

## 2023-11-30 DIAGNOSIS — I495 Sick sinus syndrome: Secondary | ICD-10-CM | POA: Diagnosis not present

## 2023-12-01 ENCOUNTER — Ambulatory Visit: Payer: Self-pay | Admitting: Internal Medicine

## 2023-12-01 LAB — CUP PACEART REMOTE DEVICE CHECK
Battery Remaining Longevity: 17 mo
Battery Remaining Percentage: 18 %
Battery Voltage: 2.89 V
Brady Statistic AP VP Percent: 43 %
Brady Statistic AP VS Percent: 29 %
Brady Statistic AS VP Percent: 1 %
Brady Statistic AS VS Percent: 27 %
Brady Statistic RA Percent Paced: 72 %
Brady Statistic RV Percent Paced: 44 %
Date Time Interrogation Session: 20251119020015
Implantable Lead Connection Status: 753985
Implantable Lead Connection Status: 753985
Implantable Lead Implant Date: 20161013
Implantable Lead Implant Date: 20161013
Implantable Lead Location: 753859
Implantable Lead Location: 753860
Implantable Pulse Generator Implant Date: 20161013
Lead Channel Impedance Value: 330 Ohm
Lead Channel Impedance Value: 330 Ohm
Lead Channel Pacing Threshold Amplitude: 0.5 V
Lead Channel Pacing Threshold Amplitude: 1.25 V
Lead Channel Pacing Threshold Pulse Width: 0.5 ms
Lead Channel Pacing Threshold Pulse Width: 0.5 ms
Lead Channel Sensing Intrinsic Amplitude: 1.3 mV
Lead Channel Sensing Intrinsic Amplitude: 12 mV
Lead Channel Setting Pacing Amplitude: 2 V
Lead Channel Setting Pacing Amplitude: 2.5 V
Lead Channel Setting Pacing Pulse Width: 0.5 ms
Lead Channel Setting Sensing Sensitivity: 2 mV
Pulse Gen Model: 2240
Pulse Gen Serial Number: 7820884

## 2023-12-02 NOTE — Progress Notes (Signed)
 Remote PPM Transmission

## 2023-12-19 ENCOUNTER — Telehealth: Payer: Self-pay | Admitting: Internal Medicine

## 2023-12-19 NOTE — Telephone Encounter (Signed)
 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  Patient may be seeing  an Engineer, Materials or physician assistants.     Message will be sent to Dr Waddell to see if he has any recommendations

## 2023-12-19 NOTE — Telephone Encounter (Signed)
 Pt daughter requesting Dr. Waddell to recommend a new Dr for her to see for her follow up. Please advise.

## 2023-12-25 NOTE — Telephone Encounter (Signed)
 Dr. Almetta

## 2023-12-27 NOTE — Telephone Encounter (Signed)
 Per  message from Dr Waddell  He recommends Dr Almetta for patient to follow up once he retires   RN left detail message on patient's daughter's secure voicemail , per DPR Left message for daughter to call back an an appt.  Any question may call back

## 2024-03-09 ENCOUNTER — Ambulatory Visit: Admitting: Student in an Organized Health Care Education/Training Program
